# Patient Record
Sex: Male | Born: 1955 | Race: White | Hispanic: No | Marital: Married | Smoking: Never smoker
Health system: Southern US, Community
[De-identification: ages and names within clinical notes are randomized; demographics above are authoritative.]

## PROBLEM LIST (undated history)

## (undated) DIAGNOSIS — N529 Male erectile dysfunction, unspecified: Secondary | ICD-10-CM

## (undated) DIAGNOSIS — I480 Paroxysmal atrial fibrillation: Secondary | ICD-10-CM

## (undated) DIAGNOSIS — Z860100 Personal history of colon polyps, unspecified: Secondary | ICD-10-CM

## (undated) DIAGNOSIS — E669 Obesity, unspecified: Secondary | ICD-10-CM

## (undated) DIAGNOSIS — K5730 Diverticulosis of large intestine without perforation or abscess without bleeding: Secondary | ICD-10-CM

## (undated) DIAGNOSIS — J342 Deviated nasal septum: Secondary | ICD-10-CM

## (undated) DIAGNOSIS — E785 Hyperlipidemia, unspecified: Secondary | ICD-10-CM

## (undated) DIAGNOSIS — I428 Other cardiomyopathies: Secondary | ICD-10-CM

## (undated) DIAGNOSIS — E7800 Pure hypercholesterolemia, unspecified: Secondary | ICD-10-CM

## (undated) DIAGNOSIS — D860 Sarcoidosis of lung: Secondary | ICD-10-CM

## (undated) DIAGNOSIS — G4733 Obstructive sleep apnea (adult) (pediatric): Secondary | ICD-10-CM

## (undated) HISTORY — DX: Pure hypercholesterolemia, unspecified: E78.00

## (undated) HISTORY — DX: Diverticulosis of large intestine without perforation or abscess without bleeding: K57.30

## (undated) HISTORY — DX: Other cardiomyopathies: I42.8

## (undated) HISTORY — DX: Obesity, unspecified: E66.9

## (undated) HISTORY — PX: CATARACT EXTRACTION: SUR2

## (undated) HISTORY — DX: Personal history of colonic polyps: Z86.010

## (undated) HISTORY — DX: Deviated nasal septum: J34.2

## (undated) HISTORY — DX: Male erectile dysfunction, unspecified: N52.9

## (undated) HISTORY — DX: Hyperlipidemia, unspecified: E78.5

## (undated) HISTORY — DX: Personal history of colon polyps, unspecified: Z86.0100

## (undated) HISTORY — DX: Paroxysmal atrial fibrillation: I48.0

## (undated) HISTORY — DX: Obstructive sleep apnea (adult) (pediatric): G47.33

## (undated) HISTORY — DX: Sarcoidosis of lung: D86.0

---

## 1977-10-22 HISTORY — PX: TONSILLECTOMY: SUR1361

## 1995-10-23 DIAGNOSIS — D860 Sarcoidosis of lung: Secondary | ICD-10-CM

## 1995-10-23 HISTORY — PX: LUNG BIOPSY: SHX232

## 1995-10-23 HISTORY — DX: Sarcoidosis of lung: D86.0

## 1999-04-11 ENCOUNTER — Ambulatory Visit (HOSPITAL_COMMUNITY): Admission: RE | Admit: 1999-04-11 | Discharge: 1999-04-11 | Payer: Self-pay | Admitting: Family Medicine

## 1999-04-11 ENCOUNTER — Encounter: Payer: Self-pay | Admitting: Family Medicine

## 1999-05-05 ENCOUNTER — Ambulatory Visit (HOSPITAL_COMMUNITY): Admission: RE | Admit: 1999-05-05 | Discharge: 1999-05-05 | Payer: Self-pay | Admitting: Orthopedic Surgery

## 2001-06-13 ENCOUNTER — Encounter: Admission: RE | Admit: 2001-06-13 | Discharge: 2001-06-13 | Payer: Self-pay | Admitting: Family Medicine

## 2001-06-13 ENCOUNTER — Encounter: Payer: Self-pay | Admitting: Family Medicine

## 2002-04-22 ENCOUNTER — Encounter: Admission: RE | Admit: 2002-04-22 | Discharge: 2002-04-22 | Payer: Self-pay | Admitting: Family Medicine

## 2002-04-22 ENCOUNTER — Encounter: Payer: Self-pay | Admitting: Family Medicine

## 2002-07-16 ENCOUNTER — Ambulatory Visit (HOSPITAL_COMMUNITY): Admission: RE | Admit: 2002-07-16 | Discharge: 2002-07-16 | Payer: Self-pay | Admitting: *Deleted

## 2002-07-16 ENCOUNTER — Encounter: Payer: Self-pay | Admitting: *Deleted

## 2003-08-23 ENCOUNTER — Encounter: Payer: Self-pay | Admitting: Internal Medicine

## 2005-01-22 ENCOUNTER — Encounter: Admission: RE | Admit: 2005-01-22 | Discharge: 2005-01-22 | Payer: Self-pay | Admitting: Family Medicine

## 2006-01-22 ENCOUNTER — Encounter: Admission: RE | Admit: 2006-01-22 | Discharge: 2006-01-22 | Payer: Self-pay | Admitting: Family Medicine

## 2006-10-16 ENCOUNTER — Encounter: Admission: RE | Admit: 2006-10-16 | Discharge: 2006-10-16 | Payer: Self-pay | Admitting: Family Medicine

## 2007-10-23 HISTORY — PX: HERNIA REPAIR: SHX51

## 2008-02-24 ENCOUNTER — Encounter: Admission: RE | Admit: 2008-02-24 | Discharge: 2008-02-24 | Payer: Self-pay | Admitting: Family Medicine

## 2008-02-25 ENCOUNTER — Encounter: Admission: RE | Admit: 2008-02-25 | Discharge: 2008-02-25 | Payer: Self-pay | Admitting: Family Medicine

## 2009-06-28 ENCOUNTER — Encounter: Payer: Self-pay | Admitting: Internal Medicine

## 2009-07-25 ENCOUNTER — Encounter: Payer: Self-pay | Admitting: Internal Medicine

## 2009-08-23 ENCOUNTER — Ambulatory Visit: Payer: Self-pay | Admitting: Internal Medicine

## 2009-08-23 DIAGNOSIS — H9192 Unspecified hearing loss, left ear: Secondary | ICD-10-CM | POA: Insufficient documentation

## 2009-08-25 ENCOUNTER — Telehealth: Payer: Self-pay | Admitting: Internal Medicine

## 2009-08-26 ENCOUNTER — Encounter: Payer: Self-pay | Admitting: Internal Medicine

## 2009-08-31 ENCOUNTER — Encounter (INDEPENDENT_AMBULATORY_CARE_PROVIDER_SITE_OTHER): Payer: Self-pay | Admitting: *Deleted

## 2009-09-14 ENCOUNTER — Telehealth: Payer: Self-pay | Admitting: Internal Medicine

## 2009-10-24 ENCOUNTER — Ambulatory Visit: Payer: Self-pay | Admitting: Internal Medicine

## 2009-10-24 DIAGNOSIS — E785 Hyperlipidemia, unspecified: Secondary | ICD-10-CM | POA: Insufficient documentation

## 2009-10-24 DIAGNOSIS — Z9189 Other specified personal risk factors, not elsewhere classified: Secondary | ICD-10-CM | POA: Insufficient documentation

## 2009-11-11 DIAGNOSIS — M109 Gout, unspecified: Secondary | ICD-10-CM | POA: Insufficient documentation

## 2009-11-11 DIAGNOSIS — K5730 Diverticulosis of large intestine without perforation or abscess without bleeding: Secondary | ICD-10-CM | POA: Insufficient documentation

## 2009-11-11 DIAGNOSIS — Z86010 Personal history of colonic polyps: Secondary | ICD-10-CM | POA: Insufficient documentation

## 2009-11-28 ENCOUNTER — Ambulatory Visit: Payer: Self-pay | Admitting: Internal Medicine

## 2009-11-28 LAB — CONVERTED CEMR LAB: Direct LDL: 185.9 mg/dL

## 2009-12-01 ENCOUNTER — Telehealth: Payer: Self-pay | Admitting: Internal Medicine

## 2009-12-01 ENCOUNTER — Encounter: Admission: RE | Admit: 2009-12-01 | Discharge: 2010-01-12 | Payer: Self-pay | Admitting: Neurology

## 2009-12-01 ENCOUNTER — Ambulatory Visit: Payer: Self-pay | Admitting: Internal Medicine

## 2009-12-01 DIAGNOSIS — T7840XA Allergy, unspecified, initial encounter: Secondary | ICD-10-CM | POA: Insufficient documentation

## 2009-12-23 ENCOUNTER — Telehealth: Payer: Self-pay | Admitting: Internal Medicine

## 2009-12-23 DIAGNOSIS — R42 Dizziness and giddiness: Secondary | ICD-10-CM | POA: Insufficient documentation

## 2010-01-04 ENCOUNTER — Encounter: Payer: Self-pay | Admitting: Internal Medicine

## 2010-01-17 ENCOUNTER — Encounter: Payer: Self-pay | Admitting: Internal Medicine

## 2010-03-13 ENCOUNTER — Encounter: Payer: Self-pay | Admitting: Internal Medicine

## 2010-04-25 ENCOUNTER — Ambulatory Visit: Payer: Self-pay | Admitting: Family Medicine

## 2010-05-10 ENCOUNTER — Encounter: Payer: Self-pay | Admitting: Internal Medicine

## 2010-05-26 ENCOUNTER — Telehealth: Payer: Self-pay | Admitting: Internal Medicine

## 2010-10-30 ENCOUNTER — Ambulatory Visit
Admission: RE | Admit: 2010-10-30 | Discharge: 2010-10-30 | Payer: Self-pay | Source: Home / Self Care | Attending: Internal Medicine | Admitting: Internal Medicine

## 2010-11-21 NOTE — Letter (Signed)
Summary: [facility]  [facility]   Imported By: Maryln Gottron 05/16/2010 11:09:52  _____________________________________________________________________  External Attachment:    Type:   Image     Comment:   External Document

## 2010-11-21 NOTE — Letter (Signed)
Summary: [facility]  [facility]   Imported By: Maryln Gottron 05/16/2010 11:11:19  _____________________________________________________________________  External Attachment:    Type:   Image     Comment:   External Document

## 2010-11-21 NOTE — Assessment & Plan Note (Signed)
Summary: 1 MONTH ROA//LH   Vital Signs:  Patient profile:   55 year old male Weight:      244 pounds Temp:     98.0 degrees F oral BP sitting:   130 / 80  (right arm) Cuff size:   regular  Vitals Entered By: Raechel Ache, RN (November 28, 2009 11:47 AM) CC: rov for labs, pt. is fasting   CC:  rov for labs and pt. is fasting.  History of Present Illness: 55 year old patient who is seen today for reassessment of his blood pressure and follow-up of his dyslipidemia.  He is scheduled for neurology follow-up tomorrow and apparently is scheduled for some vestibular rehab.  He continues to have left-sided deafness and is quite bothered by both tinnitus  and some positional vertigo. He states while snow skiing in [place], he did  quite well as far as the vertigo.  Allergies: No Known Drug Allergies  Past History:  Past Medical History: Reviewed history from 11/11/2009 and no changes required. 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  120/78overweight-appearing.   Ears:  External ear exam shows no significant lesions or deformities.  Otoscopic examination reveals clear canals, tympanic membranes are intact bilaterally without bulging, retraction, inflammation or discharge. Hearing is grossly normal bilaterally. Neurologic:  mild physiologic  nystagmus; normal finger-nose testing   Impression & Recommendations:  Problem # 1:  HYPERLIPIDEMIA (ICD-272.4)  Orders: Venipuncture (16109) TLB-Lipid Panel (80061-LIPID)  Problem # 2:  ASTHMA (ICD-493.90)  His updated medication list for this problem includes:    Proair Hfa 108 (90 Base) Mcg/act Aers (Albuterol sulfate) .Marland Kitchen... 2 puffs three times a day as  needed sob  His updated medication list for this problem includes:    Proair Hfa 108 (90 Base) Mcg/act Aers (Albuterol sulfate) .Marland Kitchen... 2 puffs three times a day as needed sob  Complete Medication List: 1)  Proair Hfa 108 (90 Base) Mcg/act Aers (Albuterol sulfate) .... 2 puffs three times a day as needed sob 2)  Hydrocodone-homatropine 5-1.5 Mg/59ml Syrp (Hydrocodone-homatropine) .Marland Kitchen.. 1 teaspoon every 6 hours as needed for cough  Patient Instructions: 1)  Please schedule a follow-up appointment in 6 months. 2)  Limit your Sodium (Salt). 3)  It is important that you exercise regularly at least 20 minutes 5 times a week. If you develop chest pain, have severe difficulty breathing, or feel very tired , stop exercising immediately and seek medical attention.

## 2010-11-21 NOTE — Progress Notes (Signed)
Summary: allergic reaction  Phone Note Call from Patient   Caller: Patient Call For: Gordy Savers  MD Summary of Call: having some sort of allergic reaction since monday- mouth and lips swollen, dry throat- taking benedryl and not helping. No trouble breathing. Initial call taken by: Raechel Ache, RN,  December 01, 2009 11:41 AM  Follow-up for Phone Call        appt today 1pm. Follow-up by: Raechel Ache, RN,  December 01, 2009 11:41 AM  Additional Follow-up for Phone Call Additional follow up Details #1::        noted Additional Follow-up by: Gordy Savers  MD,  December 01, 2009 12:43 PM

## 2010-11-21 NOTE — Letter (Signed)
Summary: [facility]  [facility]   Imported By: Maryln Gottron 02/15/2010 14:50:19  _____________________________________________________________________  External Attachment:    Type:   Image     Comment:   External Document

## 2010-11-21 NOTE — Letter (Signed)
Summary: [facility]  [facility]   Imported By: Maryln Gottron 01/06/2010 13:53:22  _____________________________________________________________________  External Attachment:    Type:   Image     Comment:   External Document

## 2010-11-21 NOTE — Assessment & Plan Note (Signed)
Summary: allergic reaction   Vital Signs:  Patient profile:   55 year old male Weight:      243 pounds O2 Sat:      97 % on Room air Temp:     98.4 degrees F oral BP sitting:   130 / 98  (left arm) Cuff size:   regular  Vitals Entered By: Duard Brady LPN (December 01, 2009 1:00 PM)  O2 Flow:  Room air CC: allergic reation possibly to food - swollen throat , no SOB , otc benadryl not helping   CC:  allergic reation possibly to food - swollen throat , no SOB , and otc benadryl not helping.  History of Present Illness: 55 year old patient who is seen today for follow-up.  He was seen here 4 days ago and seen by neurology 3 days ago.  For the past few days, he has had a sense of swelling, numbness and tingling involving primarily his lower lip, tongue, and hard palate. He  also noted a small fever blister involving his left upper lip.  initially attributed these symptoms to a food allergy since the evening before he prepared  a  hot and spicy chili dinner.  He has had some mild food allergy symptoms in the past, but he states these  symptoms always resolve within two days.  He was also quite concerned about his hearing since he recalled that in October when he had the onset of left-sided hearing loss, he presented with similar symptoms.  His symptoms at that time were attributed to Crestor, which was recently added due to dyslipidemia.  He has been using Benadryl this week without benefit.  He also describes a vague altered hearing sensation on the right and was understandably concerned about possible impending hearing loss on the right.  Allergies: No Known Drug Allergies  Past History:  Past Medical History: Reviewed history from 11/11/2009 and no changes required. pulmonary  sarcoid-lung biopsy in 1997 hearing loss left ear Hyperlipidemia history of statin myopathy hypertensive suspect modest exogenous obesity septal deviation/ history of loud snoring/no daytime sleepiness  Pollyann Kennedy) Colonic polyps, hx of Diverticulosis, colon Gout ED  Review of Systems       The patient complains of decreased hearing.  The patient denies anorexia, fever, weight loss, weight gain, vision loss, hoarseness, chest pain, syncope, dyspnea on exertion, peripheral edema, prolonged cough, headaches, hemoptysis, abdominal pain, melena, hematochezia, severe indigestion/heartburn, hematuria, incontinence, genital sores, muscle weakness, suspicious skin lesions, transient blindness, difficulty walking, depression, unusual weight change, abnormal bleeding, enlarged lymph nodes, angioedema, breast masses, and testicular masses.    Physical Exam  General:  overweight-appearing.  140/90 Head:  Normocephalic and atraumatic without obvious abnormalities. No apparent alopecia or balding. Eyes:  No corneal or conjunctival inflammation noted. EOMI. Perrla. Funduscopic exam benign, without hemorrhages, exudates or papilledema. Vision grossly normal. Ears:  External ear exam shows no significant lesions or deformities.  Otoscopic examination reveals clear canals, tympanic membranes are intact bilaterally without bulging, retraction, inflammation or discharge. Hearing is severely impaired on the left Mouth:  Oral mucosa and oropharynx without lesions or exudates.  Teeth in good repair. no erythema, or edema; a small, dry, crusted lesion involving the left upper lip noted; sensation was intact involving  the lips, tongue, palate, and buccal mucosa Neck:  No deformities, masses, or tenderness noted. Lungs:  Normal respiratory effort, chest expands symmetrically. Lungs are clear to auscultation, no crackles or wheezes. Heart:  Normal rate and regular rhythm. S1 and S2 normal  without gallop, murmur, click, rub or other extra sounds.   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 2 puffs three times a day as needed sob 2)  Hydrocodone-homatropine 5-1.5 Mg/36ml Syrp (Hydrocodone-homatropine) .Marland Kitchen.. 1 teaspoon every 6 hours as needed for cough  Other Orders: Depo- Medrol 80mg  (J1040) Admin of Therapeutic Inj  intramuscular or subcutaneous (40981)  Patient Instructions: 1)  Limit your Sodium (Salt). 2)  It is important that you exercise regularly at least 20 minutes 5 times a week. If you develop chest pain, have severe difficulty breathing, or feel very tired , stop exercising immediately and seek medical attention. 3)  You need to lose weight. Consider a lower calorie diet and regular exercise.  4)  Check your Blood Pressure regularly. If it is above: 140/90  you should make an appointment. 5)  Neurology follow up 6)  Zyrtec one daily    Medication Administration  Injection # 1:    Medication: Depo- Medrol 80mg     Diagnosis: ALLERGIC REACTION (ICD-995.3)    Route: IM    Site: L deltoid    Exp Date: 07/22/2010    Lot #: [id number]    Mfr: Teva    Patient tolerated injection without complications    Given by: Duard Brady LPN (December 01, 2009 2:34 PM)  Orders Added: 1)  Est. Patient Level III [56213] 2)  Depo- Medrol 80mg  [J1040] 3)  Admin of Therapeutic Inj  intramuscular or subcutaneous [08657]

## 2010-11-21 NOTE — Therapy (Signed)
Summary: Audiological Evaluation/[facility]  Audiological Evaluation/[facility]   Imported By: Maryln Gottron 10/27/2009 13:32:55  _____________________________________________________________________  External Attachment:    Type:   Image     Comment:   External Document

## 2010-11-21 NOTE — Assessment & Plan Note (Signed)
Summary: F/U ON HEARING LOSS ISSUES // RS   Vital Signs:  Patient profile:   55 year old male Weight:      242 pounds BMI:     32.94 BP sitting:   150 / 104  (left arm) Cuff size:   regular  Vitals Entered By: Raechel Ache, RN (October 24, 2009 11:31 AM) CC: F/u hearing loss. Recent dizziness Is Patient Diabetic? No   CC:  F/u hearing loss. Recent dizziness.  History of Present Illness: 55 year old patien who is seen today for follow-up.  The patient has a history of acute left sensorineural hearing loss and has had a neurology appointment since his last visit here.  He was told that he had possibly a small ischemic  stroke or deafness related to a viral illness.  A brain MRI with and without contrast was obtained.  This revealed a negative IAC.  Examination and no findings to suggest an acoustic schwannoma.  There is mild nonspecific bihemispheric white matter changes as well  as a left   Nasal septal deviation. He has a history of dyslipidemia, and hearing  loss occurred shortly after initiating Crestor therapy.  He states that he has been on a number of other statins that have caused either myopathy or have been ineffective. He is now off prednisone he states that he has an occasional mild vertigo over the past few days with the onset of URI symptoms.  He seems to be improving.  He did play golf two days ago  Allergies: No Known Drug Allergies  Past History:  Past Medical History: pulmonary  sarcoid-lung biopsy in 1997 hearing loss left ear Hyperlipidemia history of statin myopathy hypertensive suspect modest exogenous obesity septal deviation/ history of loud snoring/no daytime sleepiness  Past Surgical History: Reviewed history from 08/23/2009 and no changes required. tonsillectomy 1979 umbilical hernia repair 2009 lung biopsy 1997  colonoscopy 2010 And lines are in and a left upper was or 5 mg 6 times a week and 2.5 by a were taken pathology in the Review of  Systems       The patient complains of decreased hearing.  The patient denies anorexia, fever, weight loss, weight gain, vision loss, hoarseness, chest pain, syncope, dyspnea on exertion, peripheral edema, prolonged cough, headaches, hemoptysis, abdominal pain, melena, hematochezia, severe indigestion/heartburn, hematuria, incontinence, genital sores, muscle weakness, suspicious skin lesions, transient blindness, difficulty walking, depression, unusual weight change, abnormal bleeding, enlarged lymph nodes, angioedema, breast masses, and testicular masses.    Physical Exam  General:  overweight-appearing.  150/90 Head:  Normocephalic and atraumatic without obvious abnormalities. No apparent alopecia or balding. Eyes:  No corneal or conjunctival inflammation noted. EOMI. Perrla. Funduscopic exam benign, without hemorrhages, exudates or papilledema. Vision grossly normal. Ears:  External ear exam shows no significant lesions or deformities.  Otoscopic examination reveals clear canals, tympanic membranes are intact bilaterally without bulging, retraction, inflammation or discharge. Hearing is grossly normal bilaterally. Mouth:  Oral mucosa and oropharynx without lesions or exudates.  Teeth in good repair. Neck:  No deformities, masses, or tenderness noted. Lungs:  Normal respiratory effort, chest expands symmetrically. Lungs are clear to auscultation, no crackles or wheezes. Heart:  Normal rate and regular rhythm. S1 and S2 normal without gallop, murmur, click, rub or other extra sounds.   Impression & Recommendations:  Problem # 1:  HYPERLIPIDEMIA (ICD-272.4) will reassess in one month to reevaluate his blood pressure  Problem # 2:  ASTHMA (ICD-493.90)  The following medications were removed  from the medication list:    Prednisone 20 Mg Tabs (Prednisone) .Marland Kitchen... 1 three times a day His updated medication list for this problem includes:    Proair Hfa 108 (90 Base) Mcg/act Aers (Albuterol  sulfate) .Marland Kitchen... 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 2 puffs three times a day as needed sob 2)  Hydrocodone-homatropine 5-1.5 Mg/51ml Syrp (Hydrocodone-homatropine) .Marland Kitchen.. 1 teaspoon every 6 hours as needed for cough  Patient Instructions: 1)  Please schedule a follow-up appointment in 1 month. 2)  Advised not to eat any food or drink any liquids after 10 PM the night before your procedure. 3)  Limit your Sodium (Salt). 4)  It is important that you exercise regularly at least 20 minutes 5 times a week. If you develop chest pain, have severe difficulty breathing, or feel very tired , stop exercising immediately and seek medical attention. 5)  You need to lose weight. Consider a lower calorie diet and regular exercise.  Prescriptions: HYDROCODONE-HOMATROPINE 5-1.5 MG/5ML SYRP (HYDROCODONE-HOMATROPINE) 1 teaspoon every 6 hours as needed for cough  #6 oz x 0   Entered and Authorized by:   Gordy Savers  MD   Signed by:   Gordy Savers  MD on 10/24/2009   Method used:   Print then Give to Patient   RxID:   [id number] PROAIR HFA 108 (90 BASE) MCG/ACT AERS (ALBUTEROL SULFATE) 2 puffs three times a day as needed SOB  #1 x 6   Entered and Authorized by:   Gordy Savers  MD   Signed by:   Gordy Savers  MD on 10/24/2009   Method used:   Print then Give to Patient   RxID:   [id number]

## 2010-11-21 NOTE — Op Note (Signed)
Summary: Bilateral Cataract Removal/Shreyansh L. Groat, MD  Bilateral Cataract Removal/Morty L. Dione Booze, MD   Imported By: Maryln Gottron 04/06/2010 10:08:42  _____________________________________________________________________  External Attachment:    Type:   Image     Comment:   External Document

## 2010-11-21 NOTE — Letter (Signed)
Summary: Date Range:05-10-2005 to 06-28-09/[facility]  Date Range:05-10-2005 to 06-28-09/[facility]   Imported By: Sherian Rein 11/14/2009 12:08:43  _____________________________________________________________________  External Attachment:    Type:   Image     Comment:   External Document

## 2010-11-21 NOTE — Letter (Signed)
Summary: Date Range:08-21-04 to 07-25-09/[facility]  Date Range:08-21-04 to 07-25-09/[facility]   Imported By: Sherian Rein 11/14/2009 12:29:27  _____________________________________________________________________  External Attachment:    Type:   Image     Comment:   External Document

## 2010-11-21 NOTE — Assessment & Plan Note (Signed)
Summary: JOINT PAIN / TICK BITE /CONCERED ABOUT LD // RS   Vital Signs:  Patient profile:   55 year old male Weight:      246 pounds Temp:     98.1 degrees F oral BP sitting:   132 / 94  (left arm) Cuff size:   regular  Vitals Entered By: Kern Reap CMA Duncan Dull) (April 25, 2010 1:49 PM) CC: right ankle pain, tick bite   CC:  right ankle pain and tick bite.  History of Present Illness: Jason Weaver is a 55 year old male, nonsmoker, who comes in today for evaluation of two problems.  He had a history of elevated uric acid and been diagnosed with gout.  He said repeat episodes, however, he's not taken any allopurinol.  Now his right ankle is swollen and painful and is miserable.  This past spring.  He had to have a right knee effusion drained by ortho.   Six weeks ago.  He found by to ticks on him when it was embedded in his buttocks.  Two days ago he began having some fever, chills, and aching all over and is concerned he may have tick fever  Allergies (verified): No Known Drug Allergies  Past History:  Past medical, surgical, family and social histories (including risk factors) reviewed for relevance to current acute and chronic problems.  Past Medical History: Reviewed history from 11/11/2009 and no changes required. pulmonary  sarcoid-lung biopsy in 1997 hearing loss left ear Hyperlipidemia history of statin myopathy hypertensive suspect modest exogenous obesity septal deviation/ history of loud snoring/no daytime sleepiness Pollyann Kennedy) Colonic polyps, hx of Diverticulosis, colon Gout ED  Past Surgical History: Reviewed history from 11/11/2009 and no changes required. 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 father age 53, history of tobacco use, and COPD mother, age 62, history of RA maternal grandmother history of  RA one brother history of EtOH one sister with history of asthma and remote breast cancer  Social History: Reviewed history from 08/23/2009 and no changes required. Divorced Never Smoked no children [employer]  Review of Systems      See HPI  Physical Exam  General:  Well-developed,well-nourished,in no acute distress; alert,appropriate and cooperative throughout examination Msk:  the right ankle is enlarged and x 2.  Its tender, red and swollen Pulses:  R and L carotid,radial,femoral,dorsalis pedis and posterior tibial pulses are full and equal bilaterally   Problems:  Medical Problems Added: 1)  Dx of Fever  (ICD-780.60)  Impression & Recommendations:  Problem # 1:  GOUT (ICD-274.9) Assessment Deteriorated  His updated medication list for this problem includes:    Allopurinol 300 Mg Tabs (Allopurinol) .Marland Kitchen... Take 1 tablet by mouth every morning  Orders: Prescription Created Electronically [phone])  Problem # 2:  FEVER (ICD-780.60) Assessment: New  Orders: Prescription Created Electronically [id number])  Complete Medication List: 1)  Proair Hfa 108 (90 Base) Mcg/act Aers (Albuterol sulfate) .... 2 puffs three times a day as needed sob 2)  Nabumetone 500 Mg Tabs (Nabumetone) .... Take two tabs in the morning and two tabs in the evening 3)  Allopurinol 300 Mg Tabs (Allopurinol) .... Take 1 tablet by mouth every morning 4)  Prednisone 20 Mg Tabs (Prednisone) .... Uad 5)  Vicodin Es 7.5-750 Mg Tabs (Hydrocodone-acetaminophen) .Marland Kitchen.. 1 tab @ bedtime as needed pain  Patient Instructions: 1)  doxycycline  100 mg b.i.d. x 10 days. 2)  Prednisone two tabs x 3 days, one x 3 days, a half x 3 days, then half a tablet Monday, Wednesday, Friday, for a two week taper. 3)  Elevation and ice. 4)  Begin allopurinol 300 mg q.a.m............. daily............ forever!!!!!!!!!!! 5)  Please schedule a follow-up appointment as needed. Prescriptions: VICODIN ES 7.5-750 MG TABS  (HYDROCODONE-ACETAMINOPHEN) 1 tab @ bedtime as needed pain  #20 x 1   Entered and Authorized by:   Roderick Pee MD   Signed by:   Roderick Pee MD on 04/25/2010   Method used:   Print then Give to Patient   RxID:   [id number] PREDNISONE 20 MG TABS (PREDNISONE) UAD  #30 x 1   Entered and Authorized by:   Roderick Pee MD   Signed by:   Roderick Pee MD on 04/25/2010   Method used:   Electronically to        [facility]. [phone]* (retail)       [address]       [place], [place]  [address]       Ph: [phone]       Fax: [phone]   RxID:   [id number] ALLOPURINOL 300 MG TABS (ALLOPURINOL) Take 1 tablet by mouth every morning  #100 x 3   Entered and Authorized by:   Roderick Pee MD   Signed by:   Roderick Pee MD on 04/25/2010   Method used:   Electronically to        [facility]. [phone]* (retail)       [address]       [place], [place]  [address]       Ph: [phone]       Fax: [phone]   RxID:   [id number]

## 2010-11-21 NOTE — Miscellaneous (Signed)
Summary: Hep A & TD  Hep A & TD   Imported By: Sherian Rein 11/14/2009 12:12:13  _____________________________________________________________________  External Attachment:    Type:   Image     Comment:   External Document

## 2010-11-21 NOTE — Progress Notes (Signed)
Summary: [facility] allopurinol  Phone Note Refill Request Message from:  Fax from Pharmacy on May 26, 2010 11:52 AM  Refills Requested: Medication #1:  ALLOPURINOL 300 MG TABS Take 1 tablet by mouth every morning [facility] mail order   Method Requested: Fax to Local Pharmacy Initial call taken by: Duard Brady LPN,  May 26, 2010 11:53 AM    Prescriptions: ALLOPURINOL 300 MG TABS (ALLOPURINOL) Take 1 tablet by mouth every morning  #100 x 2   Entered by:   Duard Brady LPN   Authorized by:   Gordy Savers  MD   Signed by:   Duard Brady LPN on 16/07/9603   Method used:   Faxed to ...       [facility] (mail-order)             , [place]         Ph: [phone]       Fax: [phone]   RxID:   [id number]

## 2010-11-21 NOTE — Progress Notes (Signed)
Summary: MRI  Phone Note Call from Patient   Caller: Patient Call For: Gordy Savers  MD Summary of Call: Pt is asking if Dr. Kirtland Bouchard will schedule a MRI for pt's complaint of dizziness?  [phone] Initial call taken by: Lynann Beaver CMA,  December 23, 2009 11:13 AM  Follow-up for Phone Call        please schedule neuro appt for next week; let me know if unable to schedule for next week Follow-up by: Gordy Savers  MD,  December 23, 2009 12:55 PM  Additional Follow-up for Phone Call Additional follow up Details #1::        Appt Scheduled.  LMOM for Pt. Additional Follow-up by: Corky Mull,  December 28, 2009 8:49 AM  New Problems: DIZZINESS (ICD-780.4)   New Problems: DIZZINESS (ICD-780.4)

## 2010-11-23 NOTE — Assessment & Plan Note (Signed)
Summary: fu on meds/njr   Vital Signs:  Patient profile:   55 year old male Weight:      249 pounds Temp:     98.1 degrees F oral BP sitting:   124 / 80  (left arm) Cuff size:   regular  Vitals Entered By: Duard Brady LPN (October 30, 2010 8:34 AM) CC: f/u on meds and has other issues to discuss Is Patient Diabetic? No   CC:  f/u on meds and has other issues to discuss.  History of Present Illness: 15 -year-old patient who is seen today for follow-up.  He has a history of mild asthma and gout.  Approximately 18 months ago was evaluated by ENT for hearing loss on the left.  This is unchanged, but his gait instability, double vision, have all improved.  His asthma has been stable.  He does have a mild URI today  Allergies (verified): No Known Drug Allergies  Past History:  Past Medical History: Reviewed history from 11/11/2009 and no changes required. 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  The patient denies anorexia, fever, weight loss, weight gain, vision loss, decreased hearing, hoarseness, chest pain, syncope, dyspnea on exertion, peripheral edema, headaches, hemoptysis, abdominal pain, melena, hematochezia, severe indigestion/heartburn, hematuria, incontinence, genital sores, muscle weakness, suspicious skin lesions, transient blindness, difficulty walking, depression, unusual weight change, abnormal bleeding, enlarged lymph nodes, angioedema, breast masses, and testicular masses.    Physical  Exam  General:  overweight-appearing.  130/82overweight-appearing.   Head:  Normocephalic and atraumatic without obvious abnormalities. No apparent alopecia or balding. Eyes:  No corneal or conjunctival inflammation noted. EOMI. Perrla. Funduscopic exam benign, without hemorrhages, exudates or papilledema. Vision grossly normal. Ears:  External ear exam shows no significant lesions or deformities.  Otoscopic examination reveals clear canals, tympanic membranes are intact bilaterally without bulging, retraction, inflammation or discharge. Hearing is grossly normal bilaterally. Mouth:  Oral mucosa and oropharynx without lesions or exudates.  Teeth in good repair. Neck:  No deformities, masses, or tenderness noted. Lungs:  Normal respiratory effort, chest expands symmetrically. Lungs are clear to auscultation, no crackles or wheezes. Heart:  Normal rate and regular rhythm. S1 and S2 normal without gallop, murmur, click, rub or other extra sounds. Abdomen:  Bowel sounds positive,abdomen soft and non-tender without masses, organomegaly or hernias noted. Rectal:  No external abnormalities noted. Normal sphincter tone. No rectal masses or tenderness. Prostate:  Prostate gland firm and smooth, no enlargement, nodularity, tenderness, mass, asymmetry or induration. Msk:  No deformity or scoliosis noted of thoracic or lumbar spine.   Pulses:  R and L carotid,radial,femoral,dorsalis pedis and posterior tibial pulses are full and equal bilaterally Extremities:  No clubbing, cyanosis, edema, or deformity noted with normal full range of motion of all joints.   Skin:  Intact without suspicious lesions or rashes   Impression & Recommendations:  Problem # 1:  GOUT (ICD-274.9)  His updated medication list for this problem includes:    Allopurinol 300 Mg Tabs (Allopurinol) .Marland Kitchen... Take 1 tablet by mouth every morning  His updated medication list for this  problem includes:    Allopurinol 300 Mg Tabs  (Allopurinol) .Marland Kitchen... Take 1 tablet by mouth every morning  Problem # 2:  ASTHMA (ICD-493.90)  The following medications were removed from the medication list:    Prednisone 20 Mg Tabs (Prednisone) ..... Uad His updated medication list for this problem includes:    Proair Hfa 108 (90 Base) Mcg/act Aers (Albuterol sulfate) .Marland Kitchen... 2 puffs three times a day as needed sob  The following medications were removed from the medication list:    Prednisone 20 Mg Tabs (Prednisone) ..... Uad His updated medication list for this problem includes:    Proair Hfa 108 (90 Base) Mcg/act Aers (Albuterol sulfate) .Marland Kitchen... 2 puffs three times a day as needed sob  Complete Medication List: 1)  Proair Hfa 108 (90 Base) Mcg/act Aers (Albuterol sulfate) .... 2 puffs three times a day as needed sob 2)  Nabumetone 500 Mg Tabs (Nabumetone) .... Take two tabs in the morning and two tabs in the evening 3)  Allopurinol 300 Mg Tabs (Allopurinol) .... Take 1 tablet by mouth every morning  Patient Instructions: 1)  Please schedule a follow-up appointment in 1 year. 2)  Limit your Sodium (Salt). 3)  It is important that you exercise regularly at least 20 minutes 5 times a week. If you develop chest pain, have severe difficulty breathing, or feel very tired , stop exercising immediately and seek medical attention. 4)  You need to lose weight. Consider a lower calorie diet and regular exercise.  Prescriptions: PROAIR HFA 108 (90 BASE) MCG/ACT AERS (ALBUTEROL SULFATE) 2 puffs three times a day as needed SOB  #1 x 6   Entered and Authorized by:   Gordy Savers  MD   Signed by:   Gordy Savers  MD on 10/30/2010   Method used:   Electronically to        [facility]. [phone]* (retail)       [address]       [place], [place]  [address]       Ph: [phone]       Fax: [phone]   RxID:   [id number]    Orders Added: 1)  Est. Patient Level III [44034]

## 2011-01-10 ENCOUNTER — Telehealth: Payer: Self-pay | Admitting: *Deleted

## 2011-01-10 NOTE — Telephone Encounter (Signed)
Pt would like a referral for a Pulmonologist, please.

## 2011-01-11 ENCOUNTER — Ambulatory Visit (INDEPENDENT_AMBULATORY_CARE_PROVIDER_SITE_OTHER): Payer: 59 | Admitting: Internal Medicine

## 2011-01-11 ENCOUNTER — Encounter: Payer: Self-pay | Admitting: Internal Medicine

## 2011-01-11 DIAGNOSIS — D869 Sarcoidosis, unspecified: Secondary | ICD-10-CM

## 2011-01-11 DIAGNOSIS — J99 Respiratory disorders in diseases classified elsewhere: Secondary | ICD-10-CM

## 2011-01-11 DIAGNOSIS — J309 Allergic rhinitis, unspecified: Secondary | ICD-10-CM

## 2011-01-11 DIAGNOSIS — D860 Sarcoidosis of lung: Secondary | ICD-10-CM

## 2011-01-11 MED ORDER — HYDROCODONE-HOMATROPINE 5-1.5 MG/5ML PO SYRP: 5.0000 mL | ORAL_SOLUTION | Freq: Four times a day (QID) | ORAL | Status: DC | PRN

## 2011-01-11 MED ORDER — PREDNISONE 10 MG PO TABS: 10.0000 mg | ORAL_TABLET | Freq: Every day | ORAL | Status: AC

## 2011-01-11 MED ORDER — METHYLPREDNISOLONE ACETATE 80 MG/ML IJ SUSP
80.0000 mg | Freq: Once | INTRAMUSCULAR | Status: AC
  Administered 2011-01-11: 80 mg via INTRAMUSCULAR

## 2011-01-11 MED ORDER — MOMETASONE FURO-FORMOTEROL FUM 200-5 MCG/ACT IN AERO: INHALATION_SPRAY | RESPIRATORY_TRACT | Status: DC

## 2011-01-11 NOTE — Patient Instructions (Signed)
Pulmonary follow-up as scheduled  Call or return to clinic prn if these symptoms worsen or fail to improve as anticipated.  

## 2011-01-11 NOTE — Progress Notes (Signed)
  Subjective:    Patient ID: Jason Weaver, male    DOB: [birth year], 55 y.o.   MRN: [id number]  HPI   55 year old patient he has a history of biopsy-proven sarcoid lung disease since 1997. For the past several days he has had increasing chest tightness wheezing and largely nonproductive cough. He has a difficult time sleeping due to his coughing and wheezing. There's been no fever. He states this episode is very similar to his sarcoid flares in the past. There is no recent chest x-ray on file but a CT of the chest from 10 years ago was reviewed. He called yesterday for a pulmonary referral and this referral is in  Progress.  Review of Systems  Constitutional: Negative for fever, chills, appetite change and fatigue.  HENT: Negative for hearing loss, ear pain, congestion, sore throat, trouble swallowing, neck stiffness, dental problem, voice change and tinnitus.   Eyes: Negative for pain, discharge and visual disturbance.  Respiratory: Positive for cough, shortness of breath and wheezing. Negative for chest tightness and stridor.   Cardiovascular: Negative for chest pain, palpitations and leg swelling.  Gastrointestinal: Negative for nausea, vomiting, abdominal pain, diarrhea, constipation, blood in stool and abdominal distention.  Genitourinary: Negative for urgency, hematuria, flank pain, discharge, difficulty urinating and genital sores.  Musculoskeletal: Negative for myalgias, back pain, joint swelling, arthralgias and gait problem.  Skin: Negative for rash.  Neurological: Negative for dizziness, syncope, speech difficulty, weakness, numbness and headaches.  Hematological: Negative for adenopathy. Does not bruise/bleed easily.  Psychiatric/Behavioral: Negative for behavioral problems and dysphoric mood. The patient is not nervous/anxious.        Objective:   Physical Exam  Constitutional: He is oriented to person, place, and time. He appears well-developed and well-nourished. No distress.    HENT:  Head: Normocephalic.  Right Ear: External ear normal.  Left Ear: External ear normal.  Eyes: Conjunctivae and EOM are normal.  Neck: Normal range of motion.  Cardiovascular: Normal rate and normal heart sounds.   Pulmonary/Chest: Effort normal and breath sounds normal. No respiratory distress. He has no wheezes. He has no rales.  Abdominal: Bowel sounds are normal.  Musculoskeletal: Normal range of motion. He exhibits no edema and no tenderness.  Neurological: He is alert and oriented to person, place, and time.  Psychiatric: He has a normal mood and affect. His behavior is normal.          Assessment & Plan:   pulmonary sarcoidosis. No evidence of active wheezing at this time. We'll treat aggressively with parenteral steroids and placed on a modest dose of oral prednisone. Will see pulmonary in followup soon who will consider a higher prednisone dose depending on his clinical response

## 2011-01-11 NOTE — Telephone Encounter (Signed)
Spoke with pt - aware order put in , teri will call once set up. KIK

## 2011-01-11 NOTE — Telephone Encounter (Signed)
Addended by: Duard Brady on: 01/11/2011 12:42 PM   Modules accepted: Orders

## 2011-01-11 NOTE — Telephone Encounter (Signed)
ok 

## 2011-01-22 ENCOUNTER — Telehealth: Payer: Self-pay | Admitting: Critical Care Medicine

## 2011-01-22 ENCOUNTER — Encounter: Payer: Self-pay | Admitting: Pulmonary Disease

## 2011-01-22 NOTE — Telephone Encounter (Signed)
lmomtcb x 1  I looked in IDX, Centricity, and EPIC pt has not been seen by our providers.

## 2011-01-22 NOTE — Telephone Encounter (Signed)
Pt is scheduled to see PW 4/17. Pt changed to 01/23/11 @ 3:45pm in [facility] with RA. Pt is aware of [facility] appointment, given address and phone number.

## 2011-01-23 ENCOUNTER — Other Ambulatory Visit: Payer: Self-pay | Admitting: Pulmonary Disease

## 2011-01-23 ENCOUNTER — Ambulatory Visit (HOSPITAL_BASED_OUTPATIENT_CLINIC_OR_DEPARTMENT_OTHER)
Admission: RE | Admit: 2011-01-23 | Discharge: 2011-01-23 | Disposition: A | Discharge status: Home / Self Care | Payer: 59 | Source: Ambulatory Visit | Attending: Pulmonary Disease | Admitting: Pulmonary Disease

## 2011-01-23 ENCOUNTER — Ambulatory Visit (INDEPENDENT_AMBULATORY_CARE_PROVIDER_SITE_OTHER): Payer: 59 | Admitting: Pulmonary Disease

## 2011-01-23 ENCOUNTER — Encounter: Payer: Self-pay | Admitting: Pulmonary Disease

## 2011-01-23 VITALS — BP 120/80 | HR 89 | Temp 98.2°F | Ht 72.0 in | Wt 239.0 lb

## 2011-01-23 DIAGNOSIS — D869 Sarcoidosis, unspecified: Secondary | ICD-10-CM

## 2011-01-23 DIAGNOSIS — D860 Sarcoidosis of lung: Secondary | ICD-10-CM

## 2011-01-23 DIAGNOSIS — M47814 Spondylosis without myelopathy or radiculopathy, thoracic region: Secondary | ICD-10-CM | POA: Insufficient documentation

## 2011-01-23 DIAGNOSIS — R059 Cough, unspecified: Secondary | ICD-10-CM | POA: Insufficient documentation

## 2011-01-23 DIAGNOSIS — R05 Cough: Secondary | ICD-10-CM | POA: Insufficient documentation

## 2011-01-23 DIAGNOSIS — J99 Respiratory disorders in diseases classified elsewhere: Secondary | ICD-10-CM

## 2011-01-23 MED ORDER — PREDNISONE 10 MG PO TABS: ORAL_TABLET | ORAL | Status: AC

## 2011-01-23 MED ORDER — PROMETHAZINE-CODEINE 6.25-10 MG/5ML PO SYRP: 5.0000 mL | ORAL_SOLUTION | Freq: Three times a day (TID) | ORAL | Status: AC | PRN

## 2011-01-23 NOTE — Assessment & Plan Note (Signed)
Likely sarcoid flare ? Reason, pollen, doubt viral - no preceding symptoms Spirometry shows preserved lung function Will attempt longer course of prednisone, start at 40 mg  & taper to 20 mg in 1 week , then stay at this level until FU visit Chk CBC, CMET, CXR today Take zyrtec + nasonex for allergic rhinitis Codeine syrup for symptomatic relief prn

## 2011-01-23 NOTE — Progress Notes (Signed)
  Subjective:    Patient ID: Jason Weaver, male    DOB: [birth year], 55 y.o.   MRN: [id number]  HPI [name], never smoker with sarcoidosis for evlauation of cough x 2 weeks Cough - x 2 weeks, violent, gives him headaches Wheeze - tickling in throat Gurgle, phlegm - green a wk ago, now white- yellow Dulera, albuterol , MDI did not help, cough syrup makes him sleep -codeine, prednisone shot, then  20 mg x 10 ds - no relief No spiecific aggravating / relieving factors, no preceding URI Diagnosed with sarcoidosis in '97 at [facility], by exclusion,imaging & bronchoscopy - not TB, lymphoma 2011 - lost hearing on left -MRI neg for sarcoid of the brain,saw EN[facility] & neurologist [facility] >> given high dose steroids, Ct chest done , report not available but ENT note reports Bl scattered sub cm nodules, CT from 2002 notes mediastinal Lymphadenopathy He also reports skin lesions on chest , inconclusive biopsy , given cream for LA.    Review of Systems  Constitutional: Negative for fever, appetite change and unexpected weight change.  HENT: Negative for ear pain, congestion, sore throat, rhinorrhea, sneezing, trouble swallowing, dental problem and postnasal drip.   Eyes: Negative for redness.  Respiratory: Positive for cough. Negative for shortness of breath and wheezing.   Cardiovascular: Negative for chest pain, palpitations and leg swelling.  Gastrointestinal: Negative for nausea, vomiting, abdominal pain and diarrhea.  Genitourinary: Negative for dysuria and urgency.  Musculoskeletal: Negative for joint swelling.  Skin: Negative for rash.  Neurological: Positive for headaches. Negative for syncope.  Hematological: Does not bruise/bleed easily.  Psychiatric/Behavioral: Negative for dysphoric mood. The patient is not nervous/anxious.        Objective:   Physical Exam Gen. Pleasant, well-nourished, in no distress, normal affect ENT - no lesions, no post nasal drip, claass 2 airway Neck: No JVD, no  thyromegaly, no carotid bruits Lungs: no use of accessory muscles, no dullness to percussion, decreased without rales or rhonchi , appear on coughing Cardiovascular: Rhythm regular, heart sounds  normal, no murmurs or gallops, no peripheral edema Abdomen: soft and non-tender, no hepatosplenomegaly, BS normal. Musculoskeletal: No deformities, no cyanosis or clubbing Neuro:  alert, non focal        Assessment & Plan:

## 2011-01-23 NOTE — Progress Notes (Signed)
Addended by: Zackery Barefoot on: 01/23/2011 05:17 PM   Modules accepted: Orders

## 2011-01-23 NOTE — Patient Instructions (Addendum)
Chest xray Blood work Breathing test Take ZYRTEC for allergies Nasal spray sample  Prednisone Codeine cough syrup as  needed

## 2011-01-24 LAB — CBC WITH DIFFERENTIAL/PLATELET
Basophils Absolute: 0.1 10*3/uL (ref 0.0–0.1)
Basophils Relative: 1 % (ref 0–1)
Eosinophils Relative: 3 % (ref 0–5)
HCT: 48.5 % (ref 39.0–52.0)
Hemoglobin: 17 g/dL (ref 13.0–17.0)
Lymphs Abs: 1 10*3/uL (ref 0.7–4.0)
MCH: 31.7 pg (ref 26.0–34.0)
MCHC: 35.1 g/dL (ref 30.0–36.0)
Monocytes Relative: 9 % (ref 3–12)
Neutro Abs: 8.1 10*3/uL — ABNORMAL HIGH (ref 1.7–7.7)
Neutrophils Relative %: 78 % — ABNORMAL HIGH (ref 43–77)
RDW: 13.3 % (ref 11.5–15.5)

## 2011-01-24 LAB — COMPREHENSIVE METABOLIC PANEL
Albumin: 4.3 g/dL (ref 3.5–5.2)
BUN: 23 mg/dL (ref 6–23)
CO2: 25 mEq/L (ref 19–32)
Chloride: 103 mEq/L (ref 96–112)
Creat: 1.15 mg/dL (ref 0.40–1.50)
Glucose, Bld: 86 mg/dL (ref 70–99)
Potassium: 4.6 mEq/L (ref 3.5–5.3)
Total Protein: 6.9 g/dL (ref 6.0–8.3)

## 2011-02-06 ENCOUNTER — Institutional Professional Consult (permissible substitution): Payer: 59 | Admitting: Critical Care Medicine

## 2011-02-09 ENCOUNTER — Encounter: Payer: Self-pay | Admitting: Pulmonary Disease

## 2011-02-13 ENCOUNTER — Encounter: Payer: Self-pay | Admitting: Pulmonary Disease

## 2011-02-13 ENCOUNTER — Ambulatory Visit (INDEPENDENT_AMBULATORY_CARE_PROVIDER_SITE_OTHER): Payer: 59 | Admitting: Pulmonary Disease

## 2011-02-13 VITALS — BP 124/86 | HR 77 | Temp 97.6°F | Ht 72.0 in | Wt 240.4 lb

## 2011-02-13 DIAGNOSIS — D860 Sarcoidosis of lung: Secondary | ICD-10-CM

## 2011-02-13 DIAGNOSIS — D869 Sarcoidosis, unspecified: Secondary | ICD-10-CM

## 2011-02-13 DIAGNOSIS — J99 Respiratory disorders in diseases classified elsewhere: Secondary | ICD-10-CM

## 2011-02-13 NOTE — Patient Instructions (Signed)
COntinue prednisone 10 mg x 2 weeks Then 5 m g x 2 weeks, then STOP  Get reports from [facility] dermatology Take pro-air 2 puffs as needed

## 2011-02-13 NOTE — Progress Notes (Signed)
  Subjective:    Patient ID: Jason Weaver, male    DOB: [birth year], 55 y.o.   MRN: [id number]  HPI Initial OV 01/23/11 [name], never smoker with sarcoidosis for evlauation of cough x 2 weeks  Cough - x 2 weeks, violent, gives him headaches  C/o Wheeze - tickling in throat  Gurgle, phlegm - green a wk ago, now white- yellow  Dulera, albuterol , MDI did not help, cough syrup makes him sleep -codeine, prednisone shot, then 20 mg x 10 ds - no relief  No spiecific aggravating / relieving factors, no preceding URI  Diagnosed with sarcoidosis in '97 at [facility], by 'exclusion',imaging & bronchoscopy - not TB, lymphoma  2011 - lost hearing on left -MRI neg for sarcoid of the brain,saw ENt & neurologist [facility] >> given high dose steroids, Ct chest done , report not available but ENT note reports Bl scattered sub cm nodules, CT from 2002 notes mediastinal Lymphadenopathy  He also reports skin lesions on chest , inconclusive biopsy [facility]) , given cream for LA. Spirometry >> nml lung function Rx - given longer course of prednisone, start at 40 mg & taper to 20 mg in 1 week , then stay at this level until FU visit  Labs- Hb 17 Take zyrtec + nasonex for allergic rhinitis  Codeine syrup for symptomatic relief prn   02/13/2011 cough has improved.  Prod at times with whitish yellow mucus.  wheezing at times but this has also improved.  Denies SOB. Stopped prednisone for   Tiredness Skin lesions unchanged on prednisone     Review of Systems Pt denies any significant  nasal congestion or excess secretions, fever, chills, sweats, unintended wt loss, pleuritic or exertional cp, orthopnea pnd or leg swelling.  Pt also denies any obvious fluctuation in symptoms with weather or environmental change or other alleviating or aggravating factors.    Pt denies any increase in rescue therapy over baseline, denies waking up needing it or having early am exacerbations or coughing/wheezing/ or dyspnea        Objective:   Physical Exam Gen. Pleasant, well-nourished, in no distress, normal affect ENT - no lesions, no post nasal drip, claass 2 airway Neck: No JVD, no thyromegaly, no carotid bruits Lungs: no use of accessory muscles, no dullness to percussion, decreased without rales or rhonchi , appear on coughing Cardiovascular: Rhythm regular, heart sounds  normal, no murmurs or gallops, no peripheral edema Abdomen: soft and non-tender, no hepatosplenomegaly, BS normal. Musculoskeletal: No deformities, no cyanosis or clubbing Neuro:  alert, non focal SKin - pink papules over ant chest wall, none on extremities        Assessment & Plan:

## 2011-02-14 ENCOUNTER — Encounter: Payer: Self-pay | Admitting: Pulmonary Disease

## 2011-02-14 NOTE — Assessment & Plan Note (Signed)
Likely sarcoid flare ? Unclear what his neurological symtoms were attributed to Reason, pollen, doubt viral - no preceding symptoms Spirometry shows preserved lung function COntinue prednisone 10 mg x 2 weeks Then 5 m g x 2 weeks, then STOP  Get reports from [facility] dermatology - skin lesions not typical for sarcoid OK to stop dulera - no airway obstruction, use pro air prn only  Hb level of 17 is of concern - check noct oximetry for desaturations, Also snoring s/o obstructive sleep apnea

## 2011-02-20 ENCOUNTER — Encounter: Payer: Self-pay | Admitting: Pulmonary Disease

## 2011-02-26 ENCOUNTER — Telehealth (INDEPENDENT_AMBULATORY_CARE_PROVIDER_SITE_OTHER): Payer: 59 | Admitting: Pulmonary Disease

## 2011-02-26 DIAGNOSIS — G4730 Sleep apnea, unspecified: Secondary | ICD-10-CM

## 2011-02-26 NOTE — Telephone Encounter (Signed)
ONO on RA shows desaturation for about 17 mins during sleep.  This may be related to some degree of sleep apnea rather than sarcoidosis. If he is agreebale can proceed with sleep study at home or in the lab. Would prefer to hold of on oxygen unless there is worsening of symptoms.

## 2011-02-27 NOTE — Telephone Encounter (Signed)
Spoke with pt and informed of RA's recs. Pt verbalized understanding. He is okay with either option, will agree with RA's suggestion.

## 2011-02-27 NOTE — Telephone Encounter (Signed)
Pl proceed with home sleep study - use Alice , if available. I do not know how to order from this note.

## 2011-02-28 ENCOUNTER — Encounter: Payer: Self-pay | Admitting: Pulmonary Disease

## 2011-02-28 DIAGNOSIS — G4730 Sleep apnea, unspecified: Secondary | ICD-10-CM

## 2011-02-28 NOTE — Progress Notes (Signed)
  This encounter was created in error - please disregard. This encounter was created in error - please disregard.  Done in error.

## 2011-02-28 NOTE — Telephone Encounter (Signed)
Called and spoke with pt about the apnea link device. Pt stated that he will be going out of town [employer]. Pt was given my direct phone number and will call me back next week to arrange set up for this device. Please send order not referral for apnea link.

## 2011-03-02 NOTE — Telephone Encounter (Signed)
Pl place order for home sleep study

## 2011-03-05 ENCOUNTER — Encounter: Payer: Self-pay | Admitting: Pulmonary Disease

## 2011-03-12 NOTE — Telephone Encounter (Signed)
Order was placed. Signing off of note. Bjorn Loser advised PA may take up to 15 days and one was needed for this pt.

## 2011-03-20 ENCOUNTER — Encounter: Payer: Self-pay | Admitting: Pulmonary Disease

## 2011-04-05 ENCOUNTER — Ambulatory Visit (INDEPENDENT_AMBULATORY_CARE_PROVIDER_SITE_OTHER): Payer: 59 | Admitting: Pulmonary Disease

## 2011-04-05 DIAGNOSIS — Z9189 Other specified personal risk factors, not elsewhere classified: Secondary | ICD-10-CM

## 2011-04-05 NOTE — Patient Instructions (Signed)
Pl let him know sleep study showed moderate obstructive sleep apnea  Can proceed with CPPA titration int he lab if he is in agreement

## 2011-04-05 NOTE — Progress Notes (Signed)
  Subjective:    Patient ID: Jason Weaver, male    DOB: Dec 27, 1955, 55 y.o.   MRN: [id number]  HPI P ortable study shows AHI of 1/h with nadir desatn of 79% & 19 min desatn < 88% Flow evaluation period 8h 28 min    Review of Systems     Objective:   Physical Exam        Assessment & Plan:

## 2011-04-05 NOTE — Assessment & Plan Note (Signed)
Will qualify for CPAP therapy

## 2011-04-06 NOTE — Progress Notes (Signed)
I informed pt of RA's findings and recommendations. Pt verbalized understanding. Order placed for CPAP with titration.

## 2011-04-06 NOTE — Progress Notes (Signed)
Addended by: Julaine Hua on: 04/06/2011 08:49 AM   Modules accepted: Orders

## 2011-04-12 ENCOUNTER — Encounter: Payer: Self-pay | Admitting: Pulmonary Disease

## 2011-04-27 ENCOUNTER — Other Ambulatory Visit: Payer: Self-pay | Admitting: Pulmonary Disease

## 2011-04-27 DIAGNOSIS — Z9189 Other specified personal risk factors, not elsewhere classified: Secondary | ICD-10-CM

## 2011-04-30 ENCOUNTER — Ambulatory Visit (HOSPITAL_BASED_OUTPATIENT_CLINIC_OR_DEPARTMENT_OTHER): Payer: 59

## 2011-05-14 ENCOUNTER — Ambulatory Visit (HOSPITAL_BASED_OUTPATIENT_CLINIC_OR_DEPARTMENT_OTHER): Payer: 59 | Attending: Pulmonary Disease

## 2011-05-14 DIAGNOSIS — G4733 Obstructive sleep apnea (adult) (pediatric): Secondary | ICD-10-CM | POA: Insufficient documentation

## 2011-05-14 DIAGNOSIS — D869 Sarcoidosis, unspecified: Secondary | ICD-10-CM | POA: Insufficient documentation

## 2011-05-14 DIAGNOSIS — Z9189 Other specified personal risk factors, not elsewhere classified: Secondary | ICD-10-CM

## 2011-05-14 DIAGNOSIS — R0989 Other specified symptoms and signs involving the circulatory and respiratory systems: Secondary | ICD-10-CM | POA: Insufficient documentation

## 2011-05-14 DIAGNOSIS — R0609 Other forms of dyspnea: Secondary | ICD-10-CM | POA: Insufficient documentation

## 2011-05-21 ENCOUNTER — Telehealth: Payer: Self-pay | Admitting: Pulmonary Disease

## 2011-05-21 DIAGNOSIS — G4733 Obstructive sleep apnea (adult) (pediatric): Secondary | ICD-10-CM

## 2011-05-21 DIAGNOSIS — D869 Sarcoidosis, unspecified: Secondary | ICD-10-CM

## 2011-05-21 DIAGNOSIS — R0989 Other specified symptoms and signs involving the circulatory and respiratory systems: Secondary | ICD-10-CM

## 2011-05-21 DIAGNOSIS — R0609 Other forms of dyspnea: Secondary | ICD-10-CM

## 2011-05-21 NOTE — Telephone Encounter (Signed)
CPAP titration showed OSA corrected by CPAP 11 cm with C flex +3 cm Have sent Rx to [facility].

## 2011-05-21 NOTE — Procedures (Signed)
NAME:  Jason Weaver, KINNE NO.:  [id number]  MEDICAL RECORD NO.:  [id number]          PATIENT TYPE:  OUT  LOCATION:  SLEEP CENTER                 FACILITY:  [facility]  PHYSICIAN:  Oretha Milch, MD      DATE OF BIRTH:  Mar 09, 1956  DATE OF STUDY:  05/14/2011                           NOCTURNAL POLYSOMNOGRAM  REFERRING PHYSICIAN:  Oretha Milch, MD  INDICATION FOR STUDY:  Mr.  Weaver is a 55 year old gentleman with sarcoidosis and loud snoring.  A portable sleep study showed significant oxygen desaturations.  At the time of the study, he weighed 240 pounds with a height of 72 inches, BMI of 33, neck size 17 inches.  EPWORTH SLEEPINESS SCORE:  4.  This CPAP titration study was performed with sleep technologist in attendance.  EEG, EOG, EKG, EMG, and respiratory parameters were recorded.  Sleep stages, arousals, limb movements, and respiratory data were scored according to criteria laid out by the American Academy of Sleep Medicine.  SLEEP ARCHITECTURE:  Lights off was at 10:18 p.m., lights on was at 4:58 a.m.  Total sleep time was 371 minutes with a sleep period time of 384 minutes and sleep efficiency of 93%.  Sleep latency was 15 minutes, latency to REM sleep was 70 minutes, and wake after sleep onset was 13 minutes.  Sleep stages as a percentage of total sleep time was N1 of 5%, N2 of 63%, N3 of 0%, and REM sleep 31% (160 minutes).  Supine sleep accounted for 90 minutes.  Supine REM sleep was noted.  Longest period of REM sleep was around midnight.  RESPIRATORY DATA:  Sleep time was initiated at 4 cm and titrated to a final level of 11 cm due to respiratory events and snoring at a level of 4 cm for 64 minutes, one hypopnea was noted with a lowest desaturation of 89% and a final level of 11 cm for 75 minutes of sleep including 29 minutes of REM sleep, one hypopnea was noted.  The lowest desaturation of 91%.  This appears to be the optimal level used during the  study.  AROUSAL DATA:  There were total of 35 arousals with an arousal index of 6 events per hour.  Most of these were spontaneous.  OXYGEN DATA:  The desaturation index was 5 events per hour.  The lowest desaturation was 88%.  He spent 0 minute with saturation less than 88%.  CARDIAC DATA:  The low heart rate was 34 beats per minute, the high heart rate recorded was an artifact.  MOVEMENT-PARASOMNIA:  The limb movement index was 15 events per hour. Most of these were not associated with arousals.  Limb movement arousal index was only 0.2 events per hour.  DISCUSSION:  He was desensitized with a standard Mirage full-face mask. Humidity was used and C-Flex setting of +3 cm was used.  Desaturations were noted that were not related to respiratory events.  IMPRESSIONS-RECOMMENDATIONS:  Impression: 1. Mild-to-moderate obstructive sleep apnea with hypopneas causing     sleep fragmentation and oxygen desaturation. 2. This was corrected by 11 cm with a full-face mask. 3. Limb movements were noted not associated with arousals. 4. No evidence of  cardiac arrhythmias or behavioral disturbance during     sleep. 5. Desaturations were noted not related to respiratory events.  These     may be related to underlying pulmonary disease.  Recommendations: 1. Treatment options at this degree of sleep disordered breathing     include weight loss and CPAP therapy.  Alternatively, oral     appliances can also be considered. 2. CPAP can be initiated at 11 cm with a full-face mask and a C-Flex     setting of +3 cm.  Compliance can be monitored at this level. 3. He should be cautioned against medications sedative side effects. 4. He should be advised against driving when sleepy.     Oretha Milch, MD Electronically Signed    RVA/MEDQ  D:  05/21/2011 12:30:13  T:  05/21/2011 16:10:96  Job:  [id number]

## 2011-05-22 NOTE — Telephone Encounter (Signed)
[email or web address].Jason Weaver, CMA  

## 2011-05-23 NOTE — Telephone Encounter (Signed)
LMOAM for patient to return my call.

## 2011-05-23 NOTE — Telephone Encounter (Signed)
PT RETURNED CALL  Jason Weaver  °

## 2011-05-29 NOTE — Telephone Encounter (Signed)
Spoke with patient today and he was quite upset that results of sleep study has never been reviewed with patient by physician. Pt is requesting appointment with Dr. Vassie Loll to go over sleep study results in office and to discuss results, treatment options, etc. Appointment has been scheduled for Monday 06/04/11 at 11:15. Pt is aware of appointment.

## 2011-05-30 NOTE — Telephone Encounter (Signed)
He was informed of home study results per JR note. OK to discuss on OV

## 2011-06-04 ENCOUNTER — Encounter: Payer: Self-pay | Admitting: Pulmonary Disease

## 2011-06-04 ENCOUNTER — Ambulatory Visit (INDEPENDENT_AMBULATORY_CARE_PROVIDER_SITE_OTHER): Payer: 59 | Admitting: Pulmonary Disease

## 2011-06-04 DIAGNOSIS — J99 Respiratory disorders in diseases classified elsewhere: Secondary | ICD-10-CM

## 2011-06-04 DIAGNOSIS — Z9189 Other specified personal risk factors, not elsewhere classified: Secondary | ICD-10-CM

## 2011-06-04 DIAGNOSIS — G4733 Obstructive sleep apnea (adult) (pediatric): Secondary | ICD-10-CM

## 2011-06-04 DIAGNOSIS — D860 Sarcoidosis of lung: Secondary | ICD-10-CM

## 2011-06-04 DIAGNOSIS — D869 Sarcoidosis, unspecified: Secondary | ICD-10-CM

## 2011-06-04 NOTE — Assessment & Plan Note (Signed)
Given nml lung function, no treatment for now, unless flare

## 2011-06-04 NOTE — Patient Instructions (Signed)
I would suggest trial of CPAP 11 cm with nasal mask

## 2011-06-04 NOTE — Progress Notes (Signed)
  Subjective:    Patient ID: Jason Weaver, male    DOB: Jan 26, 1956, 55 y.o.   MRN: [id number]  HPI  Initial OV 01/23/11  55/M, never smoker for FU of  Sarcoidosis & obstructive sleep apnea  Presented with Cough - x 2 weeks, violent, gives him headaches  C/o Wheeze - tickling in throat  Dulera, albuterol , MDI did not help, cough syrup makes him sleep -codeine, prednisone shot, then 20 mg x 10 ds - no relief  No spiecific aggravating / relieving factors, no preceding URI  Diagnosed with sarcoidosis in '97 at [facility], by 'exclusion',imaging & bronchoscopy - not TB, lymphoma  2011 - lost hearing on left -MRI neg for sarcoid of the brain,saw ENt & neurologist [facility] >> given high dose steroids, Ct chest done , report not available but ENT note reports Bl scattered sub cm nodules, CT from 2002 notes mediastinal Lymphadenopathy  He also reports skin lesions on chest , inconclusive biopsy [facility]) >> diagnosed as rosacea , given ABx & cream for LA,  skin lesions unchanged on prednisone Spirometry >> nml lung function  Rx - given longer course of prednisone x 3 wks, stopped for tiredness Labs- Hb 17  Take zyrtec + nasonex for allergic rhinitis  Codeine syrup for symptomatic relief prn   06/04/2011 Here to discuss obstructive sleep apnea  evaluation & plan Nocturnal wheeze still present Overnight  oximetry showed desaturations, Also snoring s/o obstructive sleep apnea  P ortable study showed AHI of 18/h with nadir desatn of 79% & 19 min desatn < 88%  Flow evaluation period 8h 28 min CPAP titration showed good control of events on 11 cm - he prefers nasal mask Some PLMs present but no arrousals   Review of Systems Patient denies significant dyspnea,cough, hemoptysis,  chest pain, palpitations, pedal edema, orthopnea, paroxysmal nocturnal dyspnea, lightheadedness, nausea, vomiting, abdominal or  leg pains       Objective:   Physical Exam  Gen. Pleasant, well-nourished, in no  distress, normal affect ENT - no lesions, no post nasal drip, class 2 airway Neck: No JVD, no thyromegaly, no carotid bruits Lungs: no use of accessory muscles, no dullness to percussion, clear without rales or rhonchi  Cardiovascular: Rhythm regular, heart sounds  normal, no murmurs or gallops, no peripheral edema Abdomen: soft and non-tender, no hepatosplenomegaly, BS normal. Musculoskeletal: No deformities, no cyanosis or clubbing Neuro:  alert, non focal       Assessment & Plan:

## 2011-06-04 NOTE — Assessment & Plan Note (Signed)
INitiate CPAP 11 cm, nasal mask, c flex 3 cm, humidity Weight loss encouraged, compliance with goal of at least 4-6 hrs every night is the expectation. Advised against medications with sedative side effects Cautioned against driving when sleepy - understanding that sleepiness will vary on a day to day basis

## 2011-06-22 ENCOUNTER — Telehealth: Payer: Self-pay | Admitting: Internal Medicine

## 2011-06-22 MED ORDER — ZOLPIDEM TARTRATE 10 MG PO TABS: 10.0000 mg | ORAL_TABLET | Freq: Every evening | ORAL | Status: DC | PRN

## 2011-06-22 NOTE — Telephone Encounter (Signed)
Patient is requesting rx for Ambien to help with sleep. Please call [facility].

## 2011-06-22 NOTE — Telephone Encounter (Signed)
I dont see on med list - please advise Last seen 01/01/11

## 2011-06-22 NOTE — Telephone Encounter (Signed)
Generic Ambien 10 mg #30 refills one

## 2011-07-13 ENCOUNTER — Telehealth: Payer: Self-pay | Admitting: Pulmonary Disease

## 2011-07-13 NOTE — Telephone Encounter (Signed)
Download reviewed - CPAP effective when used & cuts down all events His complianc eis very poor. PL let him know - compliance with goal of at least 4-6 hrs every night is the expectation.

## 2011-07-16 ENCOUNTER — Ambulatory Visit (INDEPENDENT_AMBULATORY_CARE_PROVIDER_SITE_OTHER): Payer: 59 | Admitting: Pulmonary Disease

## 2011-07-16 ENCOUNTER — Encounter: Payer: Self-pay | Admitting: Pulmonary Disease

## 2011-07-16 DIAGNOSIS — D869 Sarcoidosis, unspecified: Secondary | ICD-10-CM

## 2011-07-16 DIAGNOSIS — G4733 Obstructive sleep apnea (adult) (pediatric): Secondary | ICD-10-CM

## 2011-07-16 DIAGNOSIS — J99 Respiratory disorders in diseases classified elsewhere: Secondary | ICD-10-CM

## 2011-07-16 DIAGNOSIS — D860 Sarcoidosis of lung: Secondary | ICD-10-CM

## 2011-07-16 MED ORDER — AZITHROMYCIN 250 MG PO TABS
ORAL_TABLET | ORAL | Status: AC
Start: 2011-07-16 — End: 2011-07-21

## 2011-07-16 NOTE — Patient Instructions (Addendum)
YOu are going through an adjustment period with the mask Trial of chin strap Call sleep lab [phone] - to troubleshoot whistling noise Trial of albuerol MDI  Z-pak x 5 ds

## 2011-07-16 NOTE — Progress Notes (Signed)
  Subjective:    Patient ID: Jason Weaver, male    DOB: [birth year], 55 y.o.   MRN: [id number]  HPI  55/M, never smoker for FU of  Sarcoidosis & obstructive sleep apnea  Presented 4/12 with Cough &  Wheeze - tickling in throat  Dulera, albuterol , MDI did not help, cough syrup makes him sleep -codeine, prednisone shot, then 20 mg x 10 ds - no relief   He was diagnosed with sarcoidosis in '97 at [facility], by 'exclusion',imaging & bronchoscopy - not TB, lymphoma  2011 - lost hearing on left -MRI neg for sarcoid of the brain,saw ENt & neurologist at [facility] >> given high dose steroids, Ct chest done , report not available but ENT note reports Bl scattered sub cm nodules, CT from 2002 notes mediastinal Lymphadenopathy  He also reports skin lesions on chest , inconclusive biopsy [facility]) >> diagnosed as rosacea , given ABx & cream for LA,  skin lesions unchanged on prednisone Spirometry >> nml lung function  Rx - given longer course of prednisone x 3 wks, stopped for tiredness Labs- Hb 17  Take zyrtec + nasonex for allergic rhinitis  Codeine syrup for symptomatic relief prn   Overnight  oximetry showed desaturations, Also snoring s/o obstructive sleep apnea  P ortable study showed AHI of 18/h with nadir desatn of 79% & 19 min desatn < 88%  Flow evaluation period 8h 28 min CPAP titration showed good control of events on 11 cm - he prefers nasal mask Some PLMs present but no arousals  07/16/2011 INitiated CPAP 11 cm 8/12 Download reviewed - CPAP effective when used & cuts down all events  His compliance is very poor - Pt states [place] and has not been able to use CPAP machine. Exhaling causes whistling, pulls mask off after 1 week Wheezing at night, today c/o productive green phlegm, 'congestion'  Review of Systems Patient denies significant dyspnea,cough, hemoptysis,  chest pain, palpitations, pedal edema, orthopnea, paroxysmal nocturnal dyspnea,  lightheadedness, nausea, vomiting, abdominal or  leg pains      Objective:   Physical Exam Gen. Pleasant, well-nourished, in no distress, normal affect ENT - no lesions, no post nasal drip Neck: No JVD, no thyromegaly, no carotid bruits Lungs: no use of accessory muscles, no dullness to percussion, clear without rales or rhonchi  Cardiovascular: Rhythm regular, heart sounds  normal, no murmurs or gallops, no peripheral edema Abdomen: soft and non-tender, no hepatosplenomegaly, BS normal. Musculoskeletal: No deformities, no cyanosis or clubbing Neuro:  alert, non focal        Assessment & Plan:

## 2011-07-17 NOTE — Telephone Encounter (Signed)
I informed pt of RA's findings and recommendations. Pt verbalized understanding  

## 2011-07-18 ENCOUNTER — Encounter: Payer: Self-pay | Admitting: Pulmonary Disease

## 2011-07-19 NOTE — Assessment & Plan Note (Signed)
Discussed ways to improve compliance YOu are going through an adjustment period with the mask Trial of chin strap Call sleep lab [phone] - to troubleshoot whistling noise Weight loss encouraged, compliance with goal of at least 4-6 hrs every night is the expectation. Advised against medications with sedative side effects Cautioned against driving when sleepy - understanding that sleepiness will vary on a day to day basis

## 2011-07-19 NOTE — Assessment & Plan Note (Signed)
Trial of albuterol MDI for nocturnal wheezing Z-pak x 5 ds for acute bronchitis

## 2011-08-19 ENCOUNTER — Telehealth: Payer: Self-pay | Admitting: Pulmonary Disease

## 2011-08-19 DIAGNOSIS — G4733 Obstructive sleep apnea (adult) (pediatric): Secondary | ICD-10-CM

## 2011-08-19 NOTE — Telephone Encounter (Signed)
CPAP is effective when used but usage remains poor. Is there anyway we can help him for comfort to increase usage ? Has he contacted the sleep lab or gotten chin strap?

## 2011-08-22 ENCOUNTER — Encounter: Payer: Self-pay | Admitting: Pulmonary Disease

## 2011-08-24 NOTE — Telephone Encounter (Signed)
lmomtcb x1 

## 2011-08-28 NOTE — Telephone Encounter (Signed)
I spoke with pt and he states he would like to be sent over to sleep lab for mask fit. I advised pt will send order to [facility] to have him set up with an apt at the sleep lab. Pt verbalized understanding and needed nothing further. Order has been sent

## 2011-08-28 NOTE — Telephone Encounter (Signed)
He will just have to play around with it to get the right fit. If he has not done this already, can make an appt  With sleep lab [phone] - to ensure good mask fit

## 2011-08-28 NOTE — Telephone Encounter (Signed)
Whistle noise during sleep with exhalation, discomfort of mask "can not sleep on my side" because it leaks, and has to sleep on back. Chin strap is helping a little. Dr. Vassie Loll, please advise. Thanks

## 2011-09-03 ENCOUNTER — Ambulatory Visit (HOSPITAL_BASED_OUTPATIENT_CLINIC_OR_DEPARTMENT_OTHER): Payer: 59 | Attending: Pulmonary Disease

## 2011-09-03 ENCOUNTER — Telehealth (HOSPITAL_BASED_OUTPATIENT_CLINIC_OR_DEPARTMENT_OTHER): Payer: Self-pay | Admitting: Radiology

## 2011-09-03 DIAGNOSIS — G4733 Obstructive sleep apnea (adult) (pediatric): Secondary | ICD-10-CM

## 2011-09-11 ENCOUNTER — Other Ambulatory Visit (HOSPITAL_BASED_OUTPATIENT_CLINIC_OR_DEPARTMENT_OTHER): Payer: 59

## 2011-09-23 ENCOUNTER — Telehealth: Payer: Self-pay | Admitting: Pulmonary Disease

## 2011-09-23 NOTE — Telephone Encounter (Signed)
Seems on download that he is not using CPAP anymore. Is there anything we can do to help? Seems like there was no solution for the whistling noise from his machine. Should we dc CPAP altogether?

## 2011-09-24 NOTE — Telephone Encounter (Signed)
lmomtcb x1 

## 2011-09-28 NOTE — Telephone Encounter (Signed)
Pt returning call can be reached at [phone].Jason Weaver

## 2011-10-03 NOTE — Telephone Encounter (Signed)
lmomtcb  

## 2011-10-04 NOTE — Telephone Encounter (Signed)
Pt is returning Jessica's call & can be reached at his work number, [phone].  Antionette Fairy

## 2011-10-04 NOTE — Telephone Encounter (Signed)
lmomtcb x1 

## 2011-10-18 NOTE — Telephone Encounter (Signed)
lmomtcb x 2  

## 2012-01-11 ENCOUNTER — Encounter: Payer: Self-pay | Admitting: Internal Medicine

## 2012-01-11 ENCOUNTER — Ambulatory Visit (INDEPENDENT_AMBULATORY_CARE_PROVIDER_SITE_OTHER): Payer: 59 | Admitting: Internal Medicine

## 2012-01-11 VITALS — BP 130/84 | Temp 98.0°F | Wt 246.0 lb

## 2012-01-11 DIAGNOSIS — M109 Gout, unspecified: Secondary | ICD-10-CM

## 2012-01-11 DIAGNOSIS — M778 Other enthesopathies, not elsewhere classified: Secondary | ICD-10-CM

## 2012-01-11 DIAGNOSIS — G4733 Obstructive sleep apnea (adult) (pediatric): Secondary | ICD-10-CM

## 2012-01-11 DIAGNOSIS — M65839 Other synovitis and tenosynovitis, unspecified forearm: Secondary | ICD-10-CM

## 2012-01-11 DIAGNOSIS — D860 Sarcoidosis of lung: Secondary | ICD-10-CM

## 2012-01-11 DIAGNOSIS — M25519 Pain in unspecified shoulder: Secondary | ICD-10-CM

## 2012-01-11 MED ORDER — NABUMETONE 500 MG PO TABS: 500.0000 mg | ORAL_TABLET | Freq: Two times a day (BID) | ORAL | Status: DC

## 2012-01-11 MED ORDER — METHYLPREDNISOLONE ACETATE 80 MG/ML IJ SUSP
80.0000 mg | Freq: Once | INTRAMUSCULAR | Status: AC
  Administered 2012-01-11: 80 mg via INTRAMUSCULAR

## 2012-01-11 NOTE — Patient Instructions (Signed)
Limit your sodium (Salt) intake    It is important that you exercise regularly, at least 20 minutes 3 to 4 times per week.  If you develop chest pain or shortness of breath seek  medical attention.  Call or return to clinic prn if these symptoms worsen or fail to improve as anticipated.  

## 2012-01-11 NOTE — Progress Notes (Signed)
  Subjective:    Patient ID: Jason Weaver, male    DOB: 26-Dec-1955, 56 y.o.   MRN: [id number]  HPI  56 year old patient who is followed closely by pulmonary medicine dated 2 pulmonary sarcoid as well as obstructive sleep apnea. Complaints today include left upper back pain. This has been present since a fall that occurred in January. He has received chiropractic manipulation as well as the massage therapy. He also complains of chronic left wrist pain. He has been seen by orthopedics as well as rheumatology and was told that the wrist pain was due to gouty  Arthritis.  He has been on allopurinol therapy. Pain is aggravated by attempting to play golf    Review of Systems  Musculoskeletal:       Left upper back and left wrist pain       Objective:   Physical Exam  Constitutional: He appears well-developed and well-nourished. No distress.  Musculoskeletal:       Nontender subcutaneous nodule in the left upper back region that has been chronic No focal tenderness Range of motion left shoulder appear to be intact  Examination of the left wrist revealed the mild tenderness over the extensor surface both medially and laterally          Assessment & Plan:   Left upper back pain appears to be a musculoligamentous. Will start daily anti-inflammatory medications. We'll continue the massage therapy and stretching exercises. Followup rheumatology if unimproved Left wrist pain probable tendinitis. We'll continue daily Relafen. Followup rheumatology if unimproved for consideration of injection therapy  CPX in 6 months

## 2012-05-01 ENCOUNTER — Telehealth: Payer: Self-pay | Admitting: Internal Medicine

## 2012-05-01 DIAGNOSIS — R0989 Other specified symptoms and signs involving the circulatory and respiratory systems: Secondary | ICD-10-CM

## 2012-05-01 DIAGNOSIS — G4733 Obstructive sleep apnea (adult) (pediatric): Secondary | ICD-10-CM

## 2012-05-01 DIAGNOSIS — R05 Cough: Secondary | ICD-10-CM

## 2012-05-01 DIAGNOSIS — R059 Cough, unspecified: Secondary | ICD-10-CM

## 2012-05-01 NOTE — Telephone Encounter (Signed)
Order done

## 2012-05-01 NOTE — Telephone Encounter (Signed)
Please advise 

## 2012-05-01 NOTE — Telephone Encounter (Signed)
Pt would like a referral to [facility] pulmonologist Dr Arlean Hopping [phone] for wheezing and cough.  Pt does not want to return to [facility]

## 2012-05-01 NOTE — Telephone Encounter (Signed)
OK please refer

## 2012-05-02 ENCOUNTER — Other Ambulatory Visit (INDEPENDENT_AMBULATORY_CARE_PROVIDER_SITE_OTHER): Payer: 59

## 2012-05-02 ENCOUNTER — Other Ambulatory Visit: Payer: 59

## 2012-05-02 DIAGNOSIS — Z0000 Encounter for general adult medical examination without abnormal findings: Secondary | ICD-10-CM

## 2012-05-02 LAB — POCT URINALYSIS DIPSTICK
Leukocytes, UA: NEGATIVE
Protein, UA: NEGATIVE
Spec Grav, UA: 1.02
Urobilinogen, UA: 0.2
pH, UA: 5.5

## 2012-05-02 LAB — CBC WITH DIFFERENTIAL/PLATELET
Basophils Absolute: 0 10*3/uL (ref 0.0–0.1)
Lymphocytes Relative: 10.6 % — ABNORMAL LOW (ref 12.0–46.0)
Monocytes Relative: 11.9 % (ref 3.0–12.0)
Neutrophils Relative %: 69.4 % (ref 43.0–77.0)
Platelets: 232 10*3/uL (ref 150.0–400.0)
RDW: 13.8 % (ref 11.5–14.6)

## 2012-05-02 LAB — HEPATIC FUNCTION PANEL
AST: 23 U/L (ref 0–37)
Alkaline Phosphatase: 73 U/L (ref 39–117)
Total Bilirubin: 0.8 mg/dL (ref 0.3–1.2)

## 2012-05-02 LAB — BASIC METABOLIC PANEL
BUN: 19 mg/dL (ref 6–23)
Calcium: 9.9 mg/dL (ref 8.4–10.5)
Creatinine, Ser: 0.9 mg/dL (ref 0.4–1.5)
GFR: 88.82 mL/min (ref >60.00)
Glucose, Bld: 98 mg/dL (ref 70–99)
Sodium: 138 mEq/L (ref 135–145)

## 2012-05-02 LAB — PSA: PSA: 1.12 ng/mL (ref 0.10–4.00)

## 2012-05-02 LAB — LIPID PANEL
HDL: 59.4 mg/dL (ref >39.00)
Triglycerides: 190 mg/dL — ABNORMAL HIGH (ref 0.0–149.0)
VLDL: 38 mg/dL (ref 0.0–40.0)

## 2012-05-13 ENCOUNTER — Encounter: Payer: Self-pay | Admitting: Internal Medicine

## 2012-05-13 ENCOUNTER — Ambulatory Visit (INDEPENDENT_AMBULATORY_CARE_PROVIDER_SITE_OTHER): Payer: 59 | Admitting: Internal Medicine

## 2012-05-13 VITALS — BP 124/80 | HR 72 | Temp 98.2°F | Resp 20 | Ht 72.5 in | Wt 240.0 lb

## 2012-05-13 DIAGNOSIS — D860 Sarcoidosis of lung: Secondary | ICD-10-CM

## 2012-05-13 DIAGNOSIS — J99 Respiratory disorders in diseases classified elsewhere: Secondary | ICD-10-CM

## 2012-05-13 DIAGNOSIS — Z86010 Personal history of colonic polyps: Secondary | ICD-10-CM

## 2012-05-13 DIAGNOSIS — E785 Hyperlipidemia, unspecified: Secondary | ICD-10-CM

## 2012-05-13 DIAGNOSIS — M109 Gout, unspecified: Secondary | ICD-10-CM

## 2012-05-13 DIAGNOSIS — Z0000 Encounter for general adult medical examination without abnormal findings: Secondary | ICD-10-CM

## 2012-05-13 DIAGNOSIS — D869 Sarcoidosis, unspecified: Secondary | ICD-10-CM

## 2012-05-13 MED ORDER — FLUTICASONE PROPIONATE 50 MCG/ACT NA SUSP: 2.0000 | Freq: Every day | NASAL | Status: DC

## 2012-05-13 MED ORDER — NABUMETONE 500 MG PO TABS: 500.0000 mg | ORAL_TABLET | Freq: Two times a day (BID) | ORAL | Status: DC

## 2012-05-13 MED ORDER — ALLOPURINOL 300 MG PO TABS: 300.0000 mg | ORAL_TABLET | Freq: Every day | ORAL | Status: DC | PRN

## 2012-05-13 MED ORDER — ZOLPIDEM TARTRATE 10 MG PO TABS: 10.0000 mg | ORAL_TABLET | Freq: Every evening | ORAL | Status: DC | PRN

## 2012-05-13 MED ORDER — EZETIMIBE 10 MG PO TABS: 10.0000 mg | ORAL_TABLET | Freq: Every day | ORAL | Status: DC

## 2012-05-13 MED ORDER — LEVALBUTEROL TARTRATE 45 MCG/ACT IN AERO: 1.0000 | INHALATION_SPRAY | RESPIRATORY_TRACT | Status: DC | PRN

## 2012-05-13 MED ORDER — KETOCONAZOLE 2 % EX CREA: TOPICAL_CREAM | Freq: Two times a day (BID) | CUTANEOUS | Status: DC

## 2012-05-13 MED ORDER — HYDROCODONE-ACETAMINOPHEN 5-500 MG PO TABS: 1.0000 | ORAL_TABLET | Freq: Four times a day (QID) | ORAL | Status: DC | PRN

## 2012-05-13 NOTE — Patient Instructions (Signed)
Limit your sodium (Salt) intake    It is important that you exercise regularly, at least 20 minutes 3 to 4 times per week.  If you develop chest pain or shortness of breath seek  medical attention.  He is a nonsedating antihistamine such as Zyrtec Allegra or Claritin daily  Return in one year for follow-up  Pulmonary followup as discussed

## 2012-05-13 NOTE — Progress Notes (Signed)
Subjective:    Patient ID: Jason Weaver, male    DOB: 1956/08/12, 56 y.o.   MRN: [id number]  HPI  56 year old patient who is seen today for an annual exam. He is followed by pulmonary medicine did 2 pulmonary sarcoid as well as obstructive sleep apnea. He is doing reasonably well. His chief complaint today is cough this out related to a postnasal drip. He has a history of dyslipidemia with a high HDL. He has been intolerant of at least to statins in the past with myalgias with an earlier generic and then an episode of angioedema with Crestor. He has a history of gout and hyperuricemia which has been controlled on allopurinol. He has a history of vestibular dysfunction and left-sided hearing loss.  Family history. Father age 62 has heart failure and coronary artery disease status post stenting. Mother is 17 his unremarkably good health. One brother has some alcohol issues but otherwise healthy. One sister has a history of breast cancer in remission  Past Medical History  Diagnosis Date  . Hyperlipidemia   . ED (erectile dysfunction)   . Gout   . Diverticulosis of colon   . Hx of colonic polyps   . Nasal septal deviation   . Obesity   . Hearing loss   . 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 Marital Status: Single    Spouse Name: N/A    Number of Children: N/A  . Years of Education: N/A   Occupational History  . Emergency planning/management officer    Social History Main Topics  . Smoking status: Never Smoker   . Smokeless tobacco: Never Used  . Alcohol Use: Yes  . Drug Use: Not on file  . Sexually Active: Not on file   Other Topics Concern  . Not on file   Social History Narrative  . No narrative on file    Past Surgical History  Procedure Date  . Hernia repair 2009  . Tonsillectomy 1979  . Cataract extraction   . Lung biopsy 1997    Family History  Problem Relation Age of Onset  . COPD Father     was a smoker  . Arthritis Father   .  Asthma Sister   . Cancer Sister     No Known Allergies  Current Outpatient Prescriptions on File Prior to Visit  Medication Sig Dispense Refill  . albuterol (PROAIR HFA) 108 (90 BASE) MCG/ACT inhaler Inhale 2 puffs into the lungs every 6 (six) hours as needed.        Marland Kitchen allopurinol (ZYLOPRIM) 300 MG tablet Take 300 mg by mouth daily as needed.       . Ascorbic Acid (VITAMIN C) 500 MG tablet Take 500 mg by mouth daily.        Marland Kitchen HYDROcodone-acetaminophen (VICODIN) 5-500 MG per tablet Take 1 tablet by mouth every 6 (six) hours as needed. RX'd by ortho      . ketoconazole (NIZORAL) 2 % cream As directed       . nabumetone (RELAFEN) 500 MG tablet Take 1 tablet (500 mg total) by mouth 2 (two) times daily. 2 tabs bid as needed  90 tablet  6  . Phenylephrine-DM-GG (MUCINEX FAST-MAX CONGEST COUGH) 2.5-5-100 MG/5ML LIQD Take by mouth as needed.        . Pseudoeph-Doxylamine-DM-APAP (NYQUIL) 60-7.03-20-999 MG/30ML LIQD Take by mouth as needed.        . valACYclovir (VALTREX) 1000  MG tablet Take 1 tablet by mouth as needed.        BP 124/80  Pulse 72  Temp 98.2 F (36.8 C) (Oral)  Resp 20  Ht 6' 0.5" (1.842 m)  Wt 240 lb (108.863 kg)  BMI 32.10 kg/m2  SpO2 98%       Review of Systems  Constitutional: Negative for fever, chills, activity change, appetite change and fatigue.  HENT: Positive for hearing loss. Negative for ear pain, congestion, rhinorrhea, sneezing, mouth sores, trouble swallowing, neck pain, neck stiffness, dental problem, voice change, sinus pressure and tinnitus.   Eyes: Negative for photophobia, pain, redness and visual disturbance.  Respiratory: Negative for apnea, cough, choking, chest tightness, shortness of breath and wheezing.   Cardiovascular: Negative for chest pain, palpitations and leg swelling.  Gastrointestinal: Negative for nausea, vomiting, abdominal pain, diarrhea, constipation, blood in stool, abdominal distention, anal bleeding and rectal pain.    Genitourinary: Negative for dysuria, urgency, frequency, hematuria, flank pain, decreased urine volume, discharge, penile swelling, scrotal swelling, difficulty urinating, genital sores and testicular pain.  Musculoskeletal: Positive for gait problem. Negative for myalgias, back pain, joint swelling and arthralgias.  Skin: Negative for color change, rash and wound.  Neurological: Negative for dizziness, tremors, seizures, syncope, facial asymmetry, speech difficulty, weakness, light-headedness, numbness and headaches.  Hematological: Negative for adenopathy. Does not bruise/bleed easily.  Psychiatric/Behavioral: Negative for suicidal ideas, hallucinations, behavioral problems, confusion, disturbed wake/sleep cycle, self-injury, dysphoric mood, decreased concentration and agitation. The patient is not nervous/anxious.        Objective:   Physical Exam  Constitutional: He appears well-developed and well-nourished. No distress.  HENT:  Head: Normocephalic and atraumatic.  Right Ear: External ear normal.  Left Ear: External ear normal.  Nose: Nose normal.  Mouth/Throat: Oropharynx is clear and moist.       Left-sided hearing loss Weber lateralizes to the right  Eyes: Conjunctivae and EOM are normal. Pupils are equal, round, and reactive to light. No scleral icterus.  Neck: Normal range of motion. Neck supple. No JVD present. No thyromegaly present.  Cardiovascular: Regular rhythm, normal heart sounds and intact distal pulses.  Exam reveals no gallop and no friction rub.   No murmur heard. Pulmonary/Chest: Effort normal and breath sounds normal. He exhibits no tenderness.  Abdominal: Soft. Bowel sounds are normal. He exhibits no distension and no mass. There is no tenderness.  Genitourinary: Prostate normal and penis normal.  Musculoskeletal: Normal range of motion. He exhibits no edema and no tenderness.  Lymphadenopathy:    He has no cervical adenopathy.  Neurological: He is alert. He  has normal reflexes. No cranial nerve deficit. Coordination normal.  Skin: Skin is warm and dry. No rash noted.  Psychiatric: He has a normal mood and affect. His behavior is normal.          Assessment & Plan:   Preventive health exam Colonic polyps. Will need followup colonoscopy in one year Dyslipidemia. Options discussed will continue a diet and avoid statin therapy at this time. We'll place on study at 10 mg daily Pulmonary sarcoid. Pulmonary followup in September Allergic rhinitis. Will place on fluticasone nasal as well as a nonsedating antihistamine

## 2012-05-29 DIAGNOSIS — R1310 Dysphagia, unspecified: Secondary | ICD-10-CM | POA: Insufficient documentation

## 2012-05-29 DIAGNOSIS — J309 Allergic rhinitis, unspecified: Secondary | ICD-10-CM | POA: Insufficient documentation

## 2012-06-18 ENCOUNTER — Ambulatory Visit (HOSPITAL_COMMUNITY)
Admission: RE | Admit: 2012-06-18 | Discharge: 2012-06-18 | Disposition: A | Discharge status: Home / Self Care | Payer: 59 | Source: Ambulatory Visit | Attending: Chiropractic Medicine | Admitting: Chiropractic Medicine

## 2012-06-18 ENCOUNTER — Other Ambulatory Visit (HOSPITAL_COMMUNITY): Payer: Self-pay | Admitting: Chiropractic Medicine

## 2012-06-18 DIAGNOSIS — R52 Pain, unspecified: Secondary | ICD-10-CM

## 2012-06-18 DIAGNOSIS — M47817 Spondylosis without myelopathy or radiculopathy, lumbosacral region: Secondary | ICD-10-CM | POA: Insufficient documentation

## 2012-07-17 ENCOUNTER — Telehealth: Payer: Self-pay | Admitting: Internal Medicine

## 2012-07-17 NOTE — Telephone Encounter (Signed)
Caller: Prather/Patient; Phone: [phone]; Reason for Call: Patient calling to get test results and to let Dr. Amador Cunas know he has stop taking his Cholesterol medication. Please call patient back. Thanks

## 2012-07-17 NOTE — Telephone Encounter (Signed)
Please advise 

## 2012-11-04 ENCOUNTER — Other Ambulatory Visit: Payer: Self-pay | Admitting: Internal Medicine

## 2012-11-04 ENCOUNTER — Encounter: Payer: Self-pay | Admitting: Internal Medicine

## 2012-11-04 ENCOUNTER — Ambulatory Visit (INDEPENDENT_AMBULATORY_CARE_PROVIDER_SITE_OTHER): Payer: 59 | Admitting: Internal Medicine

## 2012-11-04 VITALS — BP 130/80 | HR 92 | Temp 97.8°F | Resp 20 | Wt 244.0 lb

## 2012-11-04 DIAGNOSIS — K130 Diseases of lips: Secondary | ICD-10-CM

## 2012-11-04 DIAGNOSIS — J99 Respiratory disorders in diseases classified elsewhere: Secondary | ICD-10-CM

## 2012-11-04 DIAGNOSIS — D869 Sarcoidosis, unspecified: Secondary | ICD-10-CM

## 2012-11-04 DIAGNOSIS — G4733 Obstructive sleep apnea (adult) (pediatric): Secondary | ICD-10-CM

## 2012-11-04 DIAGNOSIS — D860 Sarcoidosis of lung: Secondary | ICD-10-CM

## 2012-11-04 MED ORDER — ZOLPIDEM TARTRATE 10 MG PO TABS: 10.0000 mg | ORAL_TABLET | Freq: Every evening | ORAL | Status: DC | PRN

## 2012-11-04 MED ORDER — MUPIROCIN 2 % EX OINT: TOPICAL_OINTMENT | Freq: Three times a day (TID) | CUTANEOUS | Status: DC

## 2012-11-04 MED ORDER — NABUMETONE 500 MG PO TABS: 500.0000 mg | ORAL_TABLET | Freq: Two times a day (BID) | ORAL | Status: DC

## 2012-11-04 MED ORDER — ALLOPURINOL 300 MG PO TABS: 300.0000 mg | ORAL_TABLET | Freq: Every day | ORAL | Status: DC | PRN

## 2012-11-04 MED ORDER — LEVALBUTEROL TARTRATE 45 MCG/ACT IN AERO: 1.0000 | INHALATION_SPRAY | RESPIRATORY_TRACT | Status: DC | PRN

## 2012-11-04 MED ORDER — HYDROCORTISONE 2.5 % RE CREA: TOPICAL_CREAM | Freq: Two times a day (BID) | RECTAL | Status: DC

## 2012-11-04 MED ORDER — HYDROCODONE-ACETAMINOPHEN 5-500 MG PO TABS: 1.0000 | ORAL_TABLET | Freq: Four times a day (QID) | ORAL | Status: DC | PRN

## 2012-11-04 MED ORDER — EZETIMIBE 10 MG PO TABS: 10.0000 mg | ORAL_TABLET | Freq: Every day | ORAL | Status: DC

## 2012-11-04 MED ORDER — SILDENAFIL CITRATE 100 MG PO TABS: 100.0000 mg | ORAL_TABLET | Freq: Every day | ORAL | Status: DC | PRN

## 2012-11-04 MED ORDER — FLUTICASONE PROPIONATE 50 MCG/ACT NA SUSP: 2.0000 | Freq: Every day | NASAL | Status: DC

## 2012-11-04 NOTE — Progress Notes (Signed)
Subjective:    Patient ID: Jason Weaver, male    DOB: Jan 17, 1956, 57 y.o.   MRN: [id number]  HPI  57 year old patient who presents with a number of concerns. He has a nasal lesion on the left which has been not bothersome for some weeks with irritation and discomfort. He also has an area of irritation involving the right side of his mouth it has been also difficult to heal. He also complains of painful hemorrhoids that intermittently bleed. He is requesting referral to a specialist for treatment  Past Medical History  Diagnosis Date  . Hyperlipidemia   . ED (erectile dysfunction)   . Gout   . Diverticulosis of colon   . Hx of colonic polyps   . Nasal septal deviation   . Obesity   . Hearing loss   . 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 Marital Status: Single    Spouse Name: N/A    Number of Children: N/A  . Years of Education: N/A   Occupational History  . Emergency planning/management officer    Social History Main Topics  . Smoking status: Never Smoker   . Smokeless tobacco: Never Used  . Alcohol Use: Yes  . Drug Use: Not on file  . Sexually Active: Not on file   Other Topics Concern  . Not on file   Social History Narrative  . No narrative on file    Past Surgical History  Procedure Date  . Hernia repair 2009  . Tonsillectomy 1979  . Cataract extraction   . Lung biopsy 1997    Family History  Problem Relation Age of Onset  . COPD Father     was a smoker  . Arthritis Father   . Asthma Sister   . Cancer Sister     No Known Allergies  Current Outpatient Prescriptions on File Prior to Visit  Medication Sig Dispense Refill  . allopurinol (ZYLOPRIM) 300 MG tablet Take 1 tablet (300 mg total) by mouth daily as needed.  90 tablet  6  . Ascorbic Acid (VITAMIN C) 500 MG tablet Take 500 mg by mouth daily.        Marland Kitchen ezetimibe (ZETIA) 10 MG tablet Take 1 tablet (10 mg total) by mouth daily.  90 tablet  3  . fluticasone (FLONASE) 50  MCG/ACT nasal spray Place 2 sprays into the nose daily.  16 g  6  . ketoconazole (NIZORAL) 2 % cream Apply topically 2 (two) times daily. As directed  15 g  3  . levalbuterol (XOPENEX HFA) 45 MCG/ACT inhaler Inhale 1-2 puffs into the lungs every 4 (four) hours as needed for wheezing.  1 Inhaler  12  . Phenylephrine-DM-GG (MUCINEX FAST-MAX CONGEST COUGH) 2.5-5-100 MG/5ML LIQD Take by mouth as needed.        . Pseudoeph-Doxylamine-DM-APAP (NYQUIL) 60-7.03-20-999 MG/30ML LIQD Take by mouth as needed.        . valACYclovir (VALTREX) 1000 MG tablet Take 1 tablet by mouth as needed.      . sildenafil (VIAGRA) 100 MG tablet Take 1 tablet (100 mg total) by mouth daily as needed for erectile dysfunction.  3 tablet  11  . zolpidem (AMBIEN) 10 MG tablet Take 1 tablet (10 mg total) by mouth at bedtime as needed for sleep.  30 tablet  2    BP 130/80  Pulse 92  Temp 97.8 F (36.6 C) (Oral)  Resp 20  Wt  244 lb (110.678 kg)  SpO2 98%      Review of Systems  HENT: Positive for hearing loss.   Skin: Positive for rash.       Objective:   Physical Exam  Constitutional: He appears well-developed and well-nourished. No distress.       Blood pressure 130/80  HENT:        A tiny papule noted involving the left  septal area consistent with small area of folliculitis  Small superficial fissure corner of the right mouth region  Pulmonary/Chest: Effort normal and breath sounds normal. No respiratory distress.          Assessment & Plan:   Angular chelitis Symptomatic hemorrhoids  Rx bactroban Sitz baths, anusol hc cream;  GI referral if unimproved

## 2012-11-04 NOTE — Patient Instructions (Addendum)
Limit your sodium (Salt) intake  Sitz Bath A sitz bath is a warm water bath taken in the sitting position that covers only the hips and buttocks. It may be used for either healing or hygiene purposes. Sitz baths are also used to relieve pain, itching, or muscle spasms. The water may contain medicine. Moist heat will help you heal and relax.   HOME CARE INSTRUCTIONS    Fill the bathtub half full with warm water.   Sit in the water and open the drain a little.   Turn on the warm water to keep the tub half full. Keep the water running constantly.   Soak in the water for 15 to 20 minutes.   After the sitz bath, pat the affected area dry first.   Take 3 to 4 sitz baths a day.  SEEK MEDICAL CARE IF:   You get worse instead of better. Stop the sitz baths if you get worse. MAKE SURE YOU:  Understand these instructions.   Will watch your condition.   Will get help right away if you are not doing well or get worse.  Document Released: 06/30/2004 Document Revised: 12/31/2011 Document Reviewed: 01/05/2011 [facility] Patient Information 2013 [place], [place].   Hemorrhoids Hemorrhoids are enlarged (dilated) veins around the rectum. There are 2 types of hemorrhoids, and the type of hemorrhoid is determined by its location.  Internal hemorrhoids occur in the veins just inside the rectum. They are usually not painful, but they may bleed. However, they may poke through to the outside and become irritated and painful. External hemorrhoids involve the veins outside the anus and can be felt as a painful swelling or hard lump near the anus. They are often itchy and may crack and bleed. Sometimes clots will form in the veins. This makes them swollen and painful. These are called thrombosed hemorrhoids. CAUSES Causes of hemorrhoids include:  Pregnancy. This increases the pressure in the hemorrhoidal veins.   Constipation.   Straining to have a bowel movement.   Obesity.   Heavy lifting or other  activity that caused you to strain.  TREATMENT Most of the time hemorrhoids improve in 1 to 2 weeks. However, if symptoms do not seem to be getting better or if you have a lot of rectal bleeding, your caregiver may perform a procedure to help make the hemorrhoids get smaller or remove them completely. Possible treatments include:  Rubber band ligation. A rubber band is placed at the base of the hemorrhoid to cut off the circulation.   Sclerotherapy. A chemical is injected to shrink the hemorrhoid.   Infrared light therapy. Tools are used to burn the hemorrhoid.   Hemorrhoidectomy. This is surgical removal of the hemorrhoid.  HOME CARE INSTRUCTIONS    Increase fiber in your diet. Ask your caregiver about using fiber supplements.   Drink enough water and fluids to keep your urine clear or pale yellow.   Exercise regularly.   Go to the bathroom when you have the urge to have a bowel movement. Do not wait.   Avoid straining to have bowel movements.   Keep the anal area dry and clean.   Only take over-the-counter or prescription medicines for pain, discomfort, or fever as directed by your caregiver.  If your hemorrhoids are thrombosed:  Take warm sitz baths for 20 to 30 minutes, 3 to 4 times per day.   If the hemorrhoids are very tender and swollen, place ice packs on the area as tolerated. Using ice packs between  sitz baths may be helpful. Fill a plastic bag with ice. Place a towel between the bag of ice and your skin.   Medicated creams and suppositories may be used or applied as directed.   Do not use a donut-shaped pillow or sit on the toilet for long periods. This increases blood pooling and pain.  SEEK MEDICAL CARE IF:    You have increasing pain and swelling that is not controlled with your medicine.   You have uncontrolled bleeding.   You have difficulty or you are unable to have a bowel movement.   You have pain or inflammation outside the area of the hemorrhoids.    You have chills or an oral temperature above 102 F (38.9 C).  MAKE SURE YOU:    Understand these instructions.   Will watch your condition.   Will get help right away if you are not doing well or get worse.  Document Released: 10/05/2000 Document Revised: 12/31/2011 Document Reviewed: 09/18/2010 [facility] Patient Information 2013 [place], [place].

## 2012-11-06 MED ORDER — ALBUTEROL SULFATE HFA 108 (90 BASE) MCG/ACT IN AERS: 2.0000 | INHALATION_SPRAY | Freq: Four times a day (QID) | RESPIRATORY_TRACT | Status: DC | PRN

## 2012-11-06 NOTE — Telephone Encounter (Signed)
Rx for Albuterol sent to pharmacy electronically.

## 2012-11-06 NOTE — Telephone Encounter (Signed)
Albuterol MDI #12 inhalations every 6 hours as needed (this probably is not available in a generic Ventolin and Proventil are two branded products)

## 2012-11-06 NOTE — Addendum Note (Signed)
Addended by: Jimmye Norman on: 11/06/2012 12:45 PM   Modules accepted: Orders

## 2012-12-18 ENCOUNTER — Ambulatory Visit (INDEPENDENT_AMBULATORY_CARE_PROVIDER_SITE_OTHER): Payer: 59 | Admitting: Internal Medicine

## 2012-12-18 ENCOUNTER — Encounter: Payer: Self-pay | Admitting: Internal Medicine

## 2012-12-18 VITALS — BP 130/80 | HR 76 | Temp 97.4°F | Resp 18 | Wt 248.0 lb

## 2012-12-18 DIAGNOSIS — T7840XA Allergy, unspecified, initial encounter: Secondary | ICD-10-CM

## 2012-12-18 DIAGNOSIS — J99 Respiratory disorders in diseases classified elsewhere: Secondary | ICD-10-CM

## 2012-12-18 DIAGNOSIS — D869 Sarcoidosis, unspecified: Secondary | ICD-10-CM

## 2012-12-18 DIAGNOSIS — D860 Sarcoidosis of lung: Secondary | ICD-10-CM

## 2012-12-18 MED ORDER — MONTELUKAST SODIUM 10 MG PO TABS: 10.0000 mg | ORAL_TABLET | Freq: Every day | ORAL | Status: DC

## 2012-12-18 MED ORDER — TRIAMCINOLONE ACETONIDE 0.1 % EX CREA: TOPICAL_CREAM | Freq: Two times a day (BID) | CUTANEOUS | Status: DC

## 2012-12-18 MED ORDER — ZOLPIDEM TARTRATE 10 MG PO TABS: 10.0000 mg | ORAL_TABLET | Freq: Every evening | ORAL | Status: DC | PRN

## 2012-12-18 MED ORDER — SILDENAFIL CITRATE 100 MG PO TABS: 100.0000 mg | ORAL_TABLET | Freq: Every day | ORAL | Status: DC | PRN

## 2012-12-18 NOTE — Progress Notes (Signed)
Subjective:    Patient ID: Jason Weaver, male    DOB: 11-20-1955, 57 y.o.   MRN: [id number]  HPI  57 year old patient who is followed by pulmonary medicine for pulmonary sarcoid. His chief complaint today is itching involving the his lower legs left greater than the right especially anteriorly. This becomes much worse at night anytime is quite warm and pruritic.   He also continues to have a chronic cough. This is associated with some upper airway irritation and drainage that persisted in spite of anti-histamines and Flonase. The cough is quite disruptive he is scheduled for pulmonary followup at [facility] in 2 months He is also complaining of some mild weakness myalgias. This occurred after doing considerable yard work over the weekend.  Past Medical History  Diagnosis Date  . Hyperlipidemia   . ED (erectile dysfunction)   . Gout   . Diverticulosis of colon   . Hx of colonic polyps   . Nasal septal deviation   . Obesity   . Hearing loss   . 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 Marital Status: Single    Spouse Name: N/A    Number of Children: N/A  . Years of Education: N/A   Occupational History  . Emergency planning/management officer    Social History Main Topics  . Smoking status: Never Smoker   . Smokeless tobacco: Never Used  . Alcohol Use: Yes  . Drug Use: Not on file  . Sexually Active: Not on file   Other Topics Concern  . Not on file   Social History Narrative  . No narrative on file    Past Surgical History  Procedure Laterality Date  . Hernia repair  2009  . Tonsillectomy  1979  . Cataract extraction    . Lung biopsy  1997    Family History  Problem Relation Age of Onset  . COPD Father     was a smoker  . Arthritis Father   . Asthma Sister   . Cancer Sister     No Known Allergies  Current Outpatient Prescriptions on File Prior to Visit  Medication Sig Dispense Refill  . albuterol (PROVENTIL HFA;VENTOLIN HFA) 108  (90 BASE) MCG/ACT inhaler Inhale 2 puffs into the lungs every 6 (six) hours as needed for wheezing.  1 Inhaler  11  . allopurinol (ZYLOPRIM) 300 MG tablet Take 1 tablet (300 mg total) by mouth daily as needed.  90 tablet  6  . Ascorbic Acid (VITAMIN C) 500 MG tablet Take 500 mg by mouth daily.        Marland Kitchen ezetimibe (ZETIA) 10 MG tablet Take 1 tablet (10 mg total) by mouth daily.  90 tablet  3  . fluticasone (FLONASE) 50 MCG/ACT nasal spray Place 2 sprays into the nose daily.  16 g  6  . HYDROcodone-acetaminophen (VICODIN) 5-500 MG per tablet Take 1 tablet by mouth every 6 (six) hours as needed. RX'd by ortho  60 tablet  3  . hydrocortisone (ANUSOL-HC) 2.5 % rectal cream Place rectally 2 (two) times daily.  30 g  3  . ketoconazole (NIZORAL) 2 % cream Apply topically 2 (two) times daily. As directed  15 g  3  . nabumetone (RELAFEN) 500 MG tablet Take 1 tablet (500 mg total) by mouth 2 (two) times daily. 2 tabs bid as needed  90 tablet  6  . Phenylephrine-DM-GG (MUCINEX FAST-MAX CONGEST COUGH) 2.5-5-100 MG/5ML  LIQD Take by mouth as needed.        . Pseudoeph-Doxylamine-DM-APAP (NYQUIL) 60-7.03-20-999 MG/30ML LIQD Take by mouth as needed.        . valACYclovir (VALTREX) 1000 MG tablet Take 1 tablet by mouth as needed.      Pauline Aus HFA 45 MCG/ACT inhaler INHALE 1-2 PUFFS INTO THE LUNGS EVERY 4 (FOUR) HOURS AS NEEDED FOR WHEEZING.  15 g  11  . zolpidem (AMBIEN) 10 MG tablet Take 1 tablet (10 mg total) by mouth at bedtime as needed for sleep.  30 tablet  3   No current facility-administered medications on file prior to visit.    BP 130/80  Pulse 76  Temp(Src) 97.4 F (36.3 C) (Oral)  Resp 18  Wt 248 lb (112.492 kg)  BMI 33.15 kg/m2  SpO2 96%       Review of Systems  Constitutional: Negative for fever, chills, appetite change and fatigue.  HENT: Positive for congestion and rhinorrhea. Negative for hearing loss, ear pain, sore throat, trouble swallowing, neck stiffness, dental problem, voice  change and tinnitus.   Eyes: Negative for pain, discharge and visual disturbance.  Respiratory: Positive for cough. Negative for chest tightness, wheezing and stridor.   Cardiovascular: Negative for chest pain, palpitations and leg swelling.  Gastrointestinal: Negative for nausea, vomiting, abdominal pain, diarrhea, constipation, blood in stool and abdominal distention.  Genitourinary: Negative for urgency, hematuria, flank pain, discharge, difficulty urinating and genital sores.  Musculoskeletal: Negative for myalgias, back pain, joint swelling, arthralgias and gait problem.  Skin: Positive for rash.  Neurological: Negative for dizziness, syncope, speech difficulty, weakness, numbness and headaches.  Hematological: Negative for adenopathy. Does not bruise/bleed easily.  Psychiatric/Behavioral: Negative for behavioral problems and dysphoric mood. The patient is not nervous/anxious.        Objective:   Physical Exam  Constitutional: He is oriented to person, place, and time. He appears well-developed.  HENT:  Head: Normocephalic.  Right Ear: External ear normal.  Left Ear: External ear normal.  Eyes: Conjunctivae and EOM are normal.  Neck: Normal range of motion.  Cardiovascular: Normal rate and normal heart sounds.   Pulmonary/Chest: Breath sounds normal.  Abdominal: Bowel sounds are normal.  Musculoskeletal: Normal range of motion. He exhibits no edema and no tenderness.  Neurological: He is alert and oriented to person, place, and time.  Skin:  Mildly dry skin involving the lower extremities with some excoriations involving the left anterior lower leg  Psychiatric: He has a normal mood and affect. His behavior is normal.          Assessment & Plan:   Dry skin- the patient has been asked to use a moisturizing cream twice daily. Was also given a prescription for triamcinolone cream to use twice daily local skin care discussed Allergic rhinitis. Cough is quite bothersome we'll  give a three-day trial of Singulair. He will discuss with pulmonary medicine and may consider allergy referral Pulmonary sarcoid

## 2012-12-18 NOTE — Patient Instructions (Signed)
Call or return to clinic prn if these symptoms worsen or fail to improve as anticipated.

## 2013-02-25 ENCOUNTER — Telehealth: Payer: Self-pay | Admitting: Internal Medicine

## 2013-02-25 NOTE — Telephone Encounter (Signed)
Patient Information:  Caller Name: Areeb  Phone: [phone]  Patient: Jason Weaver, Jason Weaver  Gender: Male  DOB: [birth year]  Age: 57 Years  PCP: Eleonore Chiquito [facility])  Office Follow Up:  Does the office need to follow up with this patient?: No  Instructions For The Office: N/A  RN Note:  Patient states he developed pain in the left lower side of his abdomen, onset 02/23/13. States pain is accomapnied by generalized aching, chills and fever per tactile. Patient states pain is constant with intermittent episodes of severe pain. Describes as "sharp." Denies nausea, vomiting or diarrhea. Denies urinary sx. States last bowel movement was 02/24/13 soft, brown in color. Care advice given per guidelines. Patient advised to be evaluated in the ED now. Patient advised nothing by mouth. Patient advised not to drive self. Patient verbalizes understanding and agreeable. Patient states he is going to [facility] for evaluation because he is currently in [facility].  Symptoms  Reason For Call & Symptoms: Abdominal pain  Reviewed Health History In EMR: Yes  Reviewed Medications In EMR: Yes  Reviewed Allergies In EMR: Yes  Reviewed Surgeries / Procedures: Yes  Date of Onset of Symptoms: 02/23/2013  Any Fever: Yes  Fever Taken: Tactile  Fever Time Of Reading: 00:00:00  Fever Last Reading: N/A  Guideline(s) Used:  Abdominal Pain - Male  Disposition Per Guideline:   Go to ED Now (or to Office with PCP Approval)  Reason For Disposition Reached:   Constant abdominal pain lasting > 2 hours  Advice Given:  Rest:  Lie down and rest until you feel better.  Call Back If:  You become worse.  RN Overrode Recommendation:  Go To ED  .

## 2013-02-27 ENCOUNTER — Ambulatory Visit (INDEPENDENT_AMBULATORY_CARE_PROVIDER_SITE_OTHER): Payer: BC Managed Care – PPO | Admitting: Internal Medicine

## 2013-02-27 ENCOUNTER — Encounter: Payer: Self-pay | Admitting: Internal Medicine

## 2013-02-27 VITALS — BP 140/90 | HR 68 | Temp 98.2°F | Resp 20 | Wt 241.0 lb

## 2013-02-27 DIAGNOSIS — K5730 Diverticulosis of large intestine without perforation or abscess without bleeding: Secondary | ICD-10-CM

## 2013-02-27 MED ORDER — METRONIDAZOLE 500 MG PO TABS: 500.0000 mg | ORAL_TABLET | Freq: Three times a day (TID) | ORAL | Status: DC

## 2013-02-27 MED ORDER — CIPROFLOXACIN HCL 500 MG PO TABS: 500.0000 mg | ORAL_TABLET | Freq: Two times a day (BID) | ORAL | Status: DC

## 2013-02-27 NOTE — Progress Notes (Signed)
Subjective:    Patient ID: Jason Weaver, male    DOB: 08/30/1956, 57 y.o.   MRN: [id number]  HPI  57 year old patient who has a history of known diverticulosis. 5 days ago he had the onset of fever and significant left lower quadrant pain. This intensified 2 days ago and he initially went to [facility] ED the left after an hour half of waiting. For the past 2 days his abdominal pain has improved and has largely resolved. He's had some mild constipation issues but otherwise his GI status has normalized. There's been no recent fever.  Past Medical History  Diagnosis Date  . Hyperlipidemia   . ED (erectile dysfunction)   . Gout   . Diverticulosis of colon   . Hx of colonic polyps   . Nasal septal deviation   . Obesity   . Hearing loss   . 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 Marital Status: Single    Spouse Name: N/A    Number of Children: N/A  . Years of Education: N/A   Occupational History  . Emergency planning/management officer    Social History Main Topics  . Smoking status: Never Smoker   . Smokeless tobacco: Never Used  . Alcohol Use: Yes  . Drug Use: Not on file  . Sexually Active: Not on file   Other Topics Concern  . Not on file   Social History Narrative  . No narrative on file    Past Surgical History  Procedure Laterality Date  . Hernia repair  2009  . Tonsillectomy  1979  . Cataract extraction    . Lung biopsy  1997    Family History  Problem Relation Age of Onset  . COPD Father     was a smoker  . Arthritis Father   . Asthma Sister   . Cancer Sister     No Known Allergies  Current Outpatient Prescriptions on File Prior to Visit  Medication Sig Dispense Refill  . albuterol (PROVENTIL HFA;VENTOLIN HFA) 108 (90 BASE) MCG/ACT inhaler Inhale 2 puffs into the lungs every 6 (six) hours as needed for wheezing.  1 Inhaler  11  . allopurinol (ZYLOPRIM) 300 MG tablet Take 1 tablet (300 mg total) by mouth daily as needed.  90  tablet  6  . Ascorbic Acid (VITAMIN C) 500 MG tablet Take 500 mg by mouth daily.        Marland Kitchen ezetimibe (ZETIA) 10 MG tablet Take 1 tablet (10 mg total) by mouth daily.  90 tablet  3  . fluticasone (FLONASE) 50 MCG/ACT nasal spray Place 2 sprays into the nose daily.  16 g  6  . HYDROcodone-acetaminophen (VICODIN) 5-500 MG per tablet Take 1 tablet by mouth every 6 (six) hours as needed. RX'd by ortho  60 tablet  3  . hydrocortisone (ANUSOL-HC) 2.5 % rectal cream Place rectally 2 (two) times daily.  30 g  3  . ketoconazole (NIZORAL) 2 % cream Apply topically 2 (two) times daily. As directed  15 g  3  . montelukast (SINGULAIR) 10 MG tablet Take 1 tablet (10 mg total) by mouth at bedtime.  30 tablet  3  . nabumetone (RELAFEN) 500 MG tablet Take 1 tablet (500 mg total) by mouth 2 (two) times daily. 2 tabs bid as needed  90 tablet  6  . Phenylephrine-DM-GG (MUCINEX FAST-MAX CONGEST COUGH) 2.5-5-100 MG/5ML LIQD Take by mouth as needed.        Marland Kitchen  Pseudoeph-Doxylamine-DM-APAP (NYQUIL) 60-7.03-20-999 MG/30ML LIQD Take by mouth as needed.        . sildenafil (VIAGRA) 100 MG tablet Take 1 tablet (100 mg total) by mouth daily as needed for erectile dysfunction.  3 tablet  11  . triamcinolone cream (KENALOG) 0.1 % Apply topically 2 (two) times daily.  60 g  2  . valACYclovir (VALTREX) 1000 MG tablet Take 1 tablet by mouth as needed.      Pauline Aus HFA 45 MCG/ACT inhaler INHALE 1-2 PUFFS INTO THE LUNGS EVERY 4 (FOUR) HOURS AS NEEDED FOR WHEEZING.  15 g  11  . zolpidem (AMBIEN) 10 MG tablet Take 1 tablet (10 mg total) by mouth at bedtime as needed for sleep.  30 tablet  2   No current facility-administered medications on file prior to visit.    BP 140/90  Pulse 68  Temp(Src) 98.2 F (36.8 C) (Oral)  Resp 20  Wt 241 lb (109.317 kg)  BMI 32.22 kg/m2  SpO2 98%       Review of Systems  Constitutional: Positive for fever, activity change and appetite change. Negative for chills and fatigue.  HENT: Negative  for hearing loss, ear pain, congestion, sore throat, trouble swallowing, neck stiffness, dental problem, voice change and tinnitus.   Eyes: Negative for pain, discharge and visual disturbance.  Respiratory: Negative for cough, chest tightness, wheezing and stridor.   Cardiovascular: Negative for chest pain, palpitations and leg swelling.  Gastrointestinal: Positive for abdominal pain. Negative for nausea, vomiting, diarrhea, constipation, blood in stool and abdominal distention.  Genitourinary: Negative for urgency, hematuria, flank pain, discharge, difficulty urinating and genital sores.  Musculoskeletal: Negative for myalgias, back pain, joint swelling, arthralgias and gait problem.  Skin: Negative for rash.  Neurological: Negative for dizziness, syncope, speech difficulty, weakness, numbness and headaches.  Hematological: Negative for adenopathy. Does not bruise/bleed easily.  Psychiatric/Behavioral: Negative for behavioral problems and dysphoric mood. The patient is not nervous/anxious.        Objective:   Physical Exam  Constitutional: He is oriented to person, place, and time. He appears well-developed.  HENT:  Head: Normocephalic.  Right Ear: External ear normal.  Left Ear: External ear normal.  Eyes: Conjunctivae and EOM are normal.  Neck: Normal range of motion.  Cardiovascular: Normal rate and normal heart sounds.   Pulmonary/Chest: Breath sounds normal.  Abdominal: Soft. Bowel sounds are normal. He exhibits no distension and no mass. There is no tenderness. There is no rebound and no guarding.  Musculoskeletal: Normal range of motion. He exhibits no edema and no tenderness.  Neurological: He is alert and oriented to person, place, and time.  Psychiatric: He has a normal mood and affect. His behavior is normal.          Assessment & Plan:   Diverticulosis. Probable mild  diverticular flare that has a large resolved. We'll continue to observe at this point. He was given  prescriptions for antibiotics  that he will not fill unless he develops issues over the weekend

## 2013-02-27 NOTE — Patient Instructions (Signed)
Diverticulitis A diverticulum is a small pouch or sac on the colon. Diverticulosis is the presence of these diverticula on the colon. Diverticulitis is the irritation (inflammation) or infection of diverticula. CAUSES  The colon and its diverticula contain bacteria. If food particles block the tiny opening to a diverticulum, the bacteria inside can grow and cause an increase in pressure. This leads to infection and inflammation and is called diverticulitis. SYMPTOMS   Abdominal pain and tenderness. Usually, the pain is located on the left side of your abdomen. However, it could be located elsewhere.  Fever.  Bloating.  Feeling sick to your stomach (nausea).  Throwing up (vomiting).  Abnormal stools. DIAGNOSIS  Your caregiver will take a history and perform a physical exam. Since many things can cause abdominal pain, other tests may be necessary. Tests may include:  Blood tests.  Urine tests.  X-ray of the abdomen.  CT scan of the abdomen. Sometimes, surgery is needed to determine if diverticulitis or other conditions are causing your symptoms. TREATMENT  Most of the time, you can be treated without surgery. Treatment includes:  Resting the bowels by only having liquids for a few days. As you improve, you will need to eat a low-fiber diet.  Intravenous (IV) fluids if you are losing body fluids (dehydrated).  Antibiotic medicines that treat infections may be given.  Pain and nausea medicine, if needed.  Surgery if the inflamed diverticulum has burst. HOME CARE INSTRUCTIONS   Try a clear liquid diet (broth, tea, or water for as long as directed by your caregiver). You may then gradually begin a low-fiber diet as tolerated. A low-fiber diet is a diet with less than 10 grams of fiber. Choose the foods below to reduce fiber in the diet:  White breads, cereals, rice, and pasta.  Cooked fruits and vegetables or soft fresh fruits and vegetables without the skin.  Ground or  well-cooked tender beef, ham, veal, lamb, pork, or poultry.  Eggs and seafood.  After your diverticulitis symptoms have improved, your caregiver may put you on a high-fiber diet. A high-fiber diet includes 14 grams of fiber for every 1000 calories consumed. For a standard 2000 calorie diet, you would need 28 grams of fiber. Follow these diet guidelines to help you increase the fiber in your diet. It is important to slowly increase the amount fiber in your diet to avoid gas, constipation, and bloating.  Choose whole-grain breads, cereals, pasta, and brown rice.  Choose fresh fruits and vegetables with the skin on. Do not overcook vegetables because the more vegetables are cooked, the more fiber is lost.  Choose more nuts, seeds, legumes, dried peas, beans, and lentils.  Look for food products that have greater than 3 grams of fiber per serving on the Nutrition Facts label.  Take all medicine as directed by your caregiver.  If your caregiver has given you a follow-up appointment, it is very important that you go. Not going could result in lasting (chronic) or permanent injury, pain, and disability. If there is any problem keeping the appointment, call to reschedule. SEEK MEDICAL CARE IF:   Your pain does not improve.  You have a hard time advancing your diet beyond clear liquids.  Your bowel movements do not return to normal. SEEK IMMEDIATE MEDICAL CARE IF:   Your pain becomes worse.  You have an oral temperature above 102 F (38.9 C), not controlled by medicine.  You have repeated vomiting.  You have bloody or black, tarry stools.  Symptoms  that brought you to your caregiver become worse or are not getting better. MAKE SURE YOU:   Understand these instructions.  Will watch your condition.  Will get help right away if you are not doing well or get worse. Document Released: 07/18/2005 Document Revised: 12/31/2011 Document Reviewed: 11/13/2010 [facility] Patient Information  2013 [place], [place]. Diverticulosis Diverticulosis is a common condition that develops when small pouches (diverticula) form in the wall of the colon. The risk of diverticulosis increases with age. It happens more often in people who eat a low-fiber diet. Most individuals with diverticulosis have no symptoms. Those individuals with symptoms usually experience abdominal pain, constipation, or loose stools (diarrhea). HOME CARE INSTRUCTIONS   Increase the amount of fiber in your diet as directed by your caregiver or dietician. This may reduce symptoms of diverticulosis.  Your caregiver may recommend taking a dietary fiber supplement.  Drink at least 6 to 8 glasses of water each day to prevent constipation.  Try not to strain when you have a bowel movement.  Your caregiver may recommend avoiding nuts and seeds to prevent complications, although this is still an uncertain benefit.  Only take over-the-counter or prescription medicines for pain, discomfort, or fever as directed by your caregiver. FOODS WITH HIGH FIBER CONTENT INCLUDE:  Fruits. Apple, peach, pear, tangerine, raisins, prunes.  Vegetables. Brussels sprouts, asparagus, broccoli, cabbage, carrot, cauliflower, romaine lettuce, spinach, summer squash, tomato, winter squash, zucchini.  Starchy Vegetables. Baked beans, kidney beans, lima beans, split peas, lentils, potatoes (with skin).  Grains. Whole wheat bread, brown rice, bran flake cereal, plain oatmeal, white rice, shredded wheat, bran muffins. SEEK IMMEDIATE MEDICAL CARE IF:   You develop increasing pain or severe bloating.  You have an oral temperature above 102 F (38.9 C), not controlled by medicine.  You develop vomiting or bowel movements that are bloody or black. Document Released: 07/05/2004 Document Revised: 12/31/2011 Document Reviewed: 03/08/2010 [facility] Patient Information 2013 [place], [place].

## 2013-06-19 ENCOUNTER — Telehealth: Payer: Self-pay | Admitting: Internal Medicine

## 2013-06-19 MED ORDER — ZOLPIDEM TARTRATE 10 MG PO TABS: 10.0000 mg | ORAL_TABLET | Freq: Every evening | ORAL | Status: DC | PRN

## 2013-06-19 NOTE — Telephone Encounter (Signed)
Left detailed message on personal voicemail Rx requested called into pharmacy.

## 2013-06-19 NOTE — Telephone Encounter (Signed)
Pt lost rx for ambien. Pt can rx into [facility]

## 2013-11-21 ENCOUNTER — Other Ambulatory Visit: Payer: Self-pay | Admitting: Internal Medicine

## 2013-12-16 ENCOUNTER — Ambulatory Visit (INDEPENDENT_AMBULATORY_CARE_PROVIDER_SITE_OTHER): Payer: BC Managed Care – PPO | Admitting: Internal Medicine

## 2013-12-16 ENCOUNTER — Encounter: Payer: Self-pay | Admitting: Internal Medicine

## 2013-12-16 VITALS — BP 140/90 | HR 68 | Temp 98.3°F | Resp 20 | Ht 72.5 in | Wt 242.0 lb

## 2013-12-16 DIAGNOSIS — J99 Respiratory disorders in diseases classified elsewhere: Secondary | ICD-10-CM

## 2013-12-16 DIAGNOSIS — R059 Cough, unspecified: Secondary | ICD-10-CM

## 2013-12-16 DIAGNOSIS — D869 Sarcoidosis, unspecified: Secondary | ICD-10-CM

## 2013-12-16 DIAGNOSIS — R05 Cough: Secondary | ICD-10-CM

## 2013-12-16 DIAGNOSIS — D860 Sarcoidosis of lung: Secondary | ICD-10-CM

## 2013-12-16 MED ORDER — HYDROCODONE-HOMATROPINE 5-1.5 MG/5ML PO SYRP: 5.0000 mL | ORAL_SOLUTION | Freq: Four times a day (QID) | ORAL | Status: AC | PRN

## 2013-12-16 MED ORDER — FLUTICASONE PROPIONATE 50 MCG/ACT NA SUSP: 2.0000 | Freq: Every day | NASAL | Status: DC

## 2013-12-16 MED ORDER — NABUMETONE 500 MG PO TABS: 500.0000 mg | ORAL_TABLET | Freq: Two times a day (BID) | ORAL | Status: DC

## 2013-12-16 NOTE — Patient Instructions (Signed)
You  may move around, but avoid painful motions and activities.   Acute sinusitis symptoms  are generally not helped by antibiotic therapy.  Use saline irrigation, warm  moist compresses and over-the-counter decongestants only as directed.  Call if there is no improvement in 5 to 7 days, or sooner if you develop increasing pain, fever, or any new symptoms.

## 2013-12-16 NOTE — Progress Notes (Signed)
Pre-visit discussion using our clinic review tool. No additional management support is needed unless otherwise documented below in the visit note.  

## 2013-12-16 NOTE — Progress Notes (Signed)
Subjective:    Patient ID: Jason Weaver, male    DOB: 09/04/1956, 58 y.o.   MRN: [id number]  HPI  58 year old patient who has a history of pulmonary sarcoidosis.  He presents with a three-week history of right lower arm pain.  This started after throwing objects for his dog to catch.  2 weeks ago he fell while skiing in [place] and injured his right shoulder.  His chief complaint also is cough.  He does have a history of pulmonary sarcoidosis and chronic cough, but this has worsened recently with sinus congestion and drainage.  Past Medical History  Diagnosis Date  . Hyperlipidemia   . ED (erectile dysfunction)   . Gout   . Diverticulosis of colon   . Hx of colonic polyps   . Nasal septal deviation   . Obesity   . Hearing loss   . 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 Marital Status: Single    Spouse Name: N/A    Number of Children: N/A  . Years of Education: N/A   Occupational History  . Government social research officer    Social History Main Topics  . Smoking status: Never Smoker   . Smokeless tobacco: Never Used  . Alcohol Use: Yes  . Drug Use: Not on file  . Sexual Activity: Not on file   Other Topics Concern  . Not on file   Social History Narrative  . No narrative on file    Past Surgical History  Procedure Laterality Date  . Hernia repair  2009  . Tonsillectomy  1979  . Cataract extraction    . Lung biopsy  1997    Family History  Problem Relation Age of Onset  . COPD Father     was a smoker  . Arthritis Father   . Asthma Sister   . Cancer Sister     No Known Allergies  Current Outpatient Prescriptions on File Prior to Visit  Medication Sig Dispense Refill  . allopurinol (ZYLOPRIM) 300 MG tablet Take 1 tablet (300 mg total) by mouth daily as needed.  90 tablet  6  . Ascorbic Acid (VITAMIN C) 500 MG tablet Take 500 mg by mouth daily.        . hydrocortisone (ANUSOL-HC) 2.5 % rectal cream Place rectally 2 (two) times  daily.  30 g  3  . ketoconazole (NIZORAL) 2 % cream Apply topically 2 (two) times daily. As directed  15 g  3  . montelukast (SINGULAIR) 10 MG tablet Take 1 tablet (10 mg total) by mouth at bedtime.  30 tablet  3  . PROAIR HFA 108 (90 BASE) MCG/ACT inhaler INHALE 2 PUFFS INTO THE LUNGS EVERY 6 (SIX) HOURS AS NEEDED FOR WHEEZING.  8.5 g  5  . triamcinolone cream (KENALOG) 0.1 % Apply topically 2 (two) times daily.  60 g  2  . fluticasone (FLONASE) 50 MCG/ACT nasal spray Place 2 sprays into the nose daily.  16 g  6  . zolpidem (AMBIEN) 10 MG tablet Take 1 tablet (10 mg total) by mouth at bedtime as needed for sleep.  30 tablet  2   No current facility-administered medications on file prior to visit.    BP 140/90  Pulse 68  Temp(Src) 98.3 F (36.8 C) (Oral)  Resp 20  Ht 6' 0.5" (1.842 m)  Wt 242 lb (109.77 kg)  BMI 32.35 kg/m2  SpO2 98%  Review of Systems  Constitutional: Negative for fever, chills, appetite change and fatigue.  HENT: Negative for congestion, dental problem, ear pain, hearing loss, sore throat, tinnitus, trouble swallowing and voice change.   Eyes: Negative for pain, discharge and visual disturbance.  Respiratory: Positive for cough and wheezing. Negative for chest tightness and stridor.   Cardiovascular: Negative for chest pain, palpitations and leg swelling.  Gastrointestinal: Negative for nausea, vomiting, abdominal pain, diarrhea, constipation, blood in stool and abdominal distention.  Genitourinary: Negative for urgency, hematuria, flank pain, discharge, difficulty urinating and genital sores.  Musculoskeletal: Negative for arthralgias, back pain, gait problem, joint swelling, myalgias and neck stiffness.  Skin: Negative for rash.  Neurological: Negative for dizziness, syncope, speech difficulty, weakness, numbness and headaches.  Hematological: Negative for adenopathy. Does not bruise/bleed easily.  Psychiatric/Behavioral: Negative for behavioral  problems and dysphoric mood. The patient is not nervous/anxious.        Objective:   Physical Exam  Constitutional: He appears well-developed and well-nourished. No distress.  Musculoskeletal:  Slight tenderness right anterior shoulder Full range of motion Provocative tests for rotator cuff pathology.  All normal  Slight tenderness involving the right lower arm soft tissues Pain  aggravated by flexion of the lower arm against resistance          Assessment & Plan:   Traumatic right shoulder pain Right lower arm tendinitis Chronic cough.  Secondary to ulnar sarcoidosis and aggravated by upper airway irritation secondary to postnasal drip.  We'll treat symptomatically

## 2014-01-06 ENCOUNTER — Other Ambulatory Visit: Payer: Self-pay | Admitting: Internal Medicine

## 2014-01-06 DIAGNOSIS — M25572 Pain in left ankle and joints of left foot: Secondary | ICD-10-CM

## 2014-01-13 ENCOUNTER — Other Ambulatory Visit: Payer: BC Managed Care – PPO

## 2014-03-19 ENCOUNTER — Telehealth: Payer: Self-pay | Admitting: Internal Medicine

## 2014-03-19 NOTE — Telephone Encounter (Signed)
stendra

## 2014-03-19 NOTE — Telephone Encounter (Signed)
Please advise 

## 2014-03-19 NOTE — Telephone Encounter (Signed)
Pt called in to req rx on Stendra or Sildenafil he said  which ever one Dr Burnice Logan think [facility] [place] [facility]

## 2014-03-22 MED ORDER — AVANAFIL 200 MG PO TABS: 0.5000 | ORAL_TABLET | ORAL | Status: DC | PRN

## 2014-03-22 NOTE — Telephone Encounter (Signed)
Dr. Raliegh Ip, what dosage of [place] do you want to order?

## 2014-03-22 NOTE — Telephone Encounter (Signed)
Left detailed message Rx sent to pharmacy, any questions please call office.

## 2014-03-22 NOTE — Telephone Encounter (Signed)
200 mg  1/2-1 tab a needed

## 2014-03-25 ENCOUNTER — Telehealth: Payer: Self-pay | Admitting: Internal Medicine

## 2014-03-25 ENCOUNTER — Ambulatory Visit
Admission: RE | Admit: 2014-03-25 | Discharge: 2014-03-25 | Disposition: A | Discharge status: Home / Self Care | Payer: BC Managed Care – PPO | Source: Ambulatory Visit | Attending: Internal Medicine | Admitting: Internal Medicine

## 2014-03-25 DIAGNOSIS — M25572 Pain in left ankle and joints of left foot: Secondary | ICD-10-CM

## 2014-03-25 MED ORDER — GADOBENATE DIMEGLUMINE 529 MG/ML IV SOLN
10.0000 mL | Freq: Once | INTRAVENOUS | Status: AC | PRN
  Administered 2014-03-25: 10 mL via INTRAVENOUS

## 2014-03-25 MED ORDER — AVANAFIL 200 MG PO TABS: 0.5000 | ORAL_TABLET | ORAL | Status: DC | PRN

## 2014-03-25 NOTE — Telephone Encounter (Signed)
New Rx sent to pharmacy

## 2014-03-25 NOTE — Telephone Encounter (Signed)
[employer] has APPROVED medication Stendra, however the QUANTITY LIMIT is 4 per 30 days.  Can you send a new RX with the quantity 4 per 30 days to [facility] on [address]?? I spoke to the pharmacy and they are going to fill the 4 with a $15 co-pay but would like the new RX to have on file for future re-fills.

## 2014-04-20 DIAGNOSIS — S93402A Sprain of unspecified ligament of left ankle, initial encounter: Secondary | ICD-10-CM | POA: Insufficient documentation

## 2014-04-20 DIAGNOSIS — M19072 Primary osteoarthritis, left ankle and foot: Secondary | ICD-10-CM | POA: Insufficient documentation

## 2014-04-20 DIAGNOSIS — M1990 Unspecified osteoarthritis, unspecified site: Secondary | ICD-10-CM | POA: Insufficient documentation

## 2014-07-26 ENCOUNTER — Encounter: Payer: Self-pay | Admitting: Internal Medicine

## 2014-07-26 ENCOUNTER — Ambulatory Visit (INDEPENDENT_AMBULATORY_CARE_PROVIDER_SITE_OTHER): Payer: BC Managed Care – PPO | Admitting: Internal Medicine

## 2014-07-26 ENCOUNTER — Telehealth: Payer: Self-pay | Admitting: Internal Medicine

## 2014-07-26 VITALS — BP 130/86 | HR 68 | Temp 97.8°F | Resp 20 | Ht 72.5 in | Wt 228.0 lb

## 2014-07-26 DIAGNOSIS — Z86010 Personal history of colonic polyps: Secondary | ICD-10-CM

## 2014-07-26 DIAGNOSIS — K5730 Diverticulosis of large intestine without perforation or abscess without bleeding: Secondary | ICD-10-CM

## 2014-07-26 MED ORDER — CIPROFLOXACIN HCL 500 MG PO TABS: 500.0000 mg | ORAL_TABLET | Freq: Two times a day (BID) | ORAL | Status: DC

## 2014-07-26 NOTE — Patient Instructions (Signed)
Call or return to clinic prn if these symptoms worsen or fail to improve as anticipated.  Diverticulitis Diverticulitis is inflammation or infection of small pouches in your colon that form when you have a condition called diverticulosis. The pouches in your colon are called diverticula. Your colon, or large intestine, is where water is absorbed and stool is formed. Complications of diverticulitis can include:  Bleeding.  Severe infection.  Severe pain.  Perforation of your colon.  Obstruction of your colon. CAUSES  Diverticulitis is caused by bacteria. Diverticulitis happens when stool becomes trapped in diverticula. This allows bacteria to grow in the diverticula, which can lead to inflammation and infection. RISK FACTORS People with diverticulosis are at risk for diverticulitis. Eating a diet that does not include enough fiber from fruits and vegetables may make diverticulitis more likely to develop. SYMPTOMS  Symptoms of diverticulitis may include:  Abdominal pain and tenderness. The pain is normally located on the left side of the abdomen, but may occur in other areas.  Fever and chills.  Bloating.  Cramping.  Nausea.  Vomiting.  Constipation.  Diarrhea.  Blood in your stool. DIAGNOSIS  Your health care provider will ask you about your medical history and do a physical exam. You may need to have tests done because many medical conditions can cause the same symptoms as diverticulitis. Tests may include:  Blood tests.  Urine tests.  Imaging tests of the abdomen, including X-rays and CT scans. When your condition is under control, your health care provider may recommend that you have a colonoscopy. A colonoscopy can show how severe your diverticula are and whether something else is causing your symptoms. TREATMENT  Most cases of diverticulitis are mild and can be treated at home. Treatment may include:  Taking over-the-counter pain medicines.  Following a clear  liquid diet.  Taking antibiotic medicines by mouth for 7-10 days. More severe cases may be treated at a hospital. Treatment may include:  Not eating or drinking.  Taking prescription pain medicine.  Receiving antibiotic medicines through an IV tube.  Receiving fluids and nutrition through an IV tube.  Surgery. HOME CARE INSTRUCTIONS   Follow your health care provider's instructions carefully.  Follow a full liquid diet or other diet as directed by your health care provider. After your symptoms improve, your health care provider may tell you to change your diet. He or she may recommend you eat a high-fiber diet. Fruits and vegetables are good sources of fiber. Fiber makes it easier to pass stool.  Take fiber supplements or probiotics as directed by your health care provider.  Only take medicines as directed by your health care provider.  Keep all your follow-up appointments. SEEK MEDICAL CARE IF:   Your pain does not improve.  You have a hard time eating food.  Your bowel movements do not return to normal. SEEK IMMEDIATE MEDICAL CARE IF:   Your pain becomes worse.  Your symptoms do not get better.  Your symptoms suddenly get worse.  You have a fever.  You have repeated vomiting.  You have bloody or black, tarry stools. MAKE SURE YOU:   Understand these instructions.  Will watch your condition.  Will get help right away if you are not doing well or get worse. Document Released: 07/18/2005 Document Revised: 10/13/2013 Document Reviewed: 09/02/2013 [employer] Patient Information 2015 [employer]. This information is not intended to replace advice given to you by your health care provider. Make sure you discuss any questions you have with your   health care provider.  

## 2014-07-26 NOTE — Telephone Encounter (Signed)
Patient Information:  Caller Name: Jason Weaver  Phone: [phone]  Patient: Jason, Weaver  Gender: Male  DOB: 1956-09-20  Age: 58 Years  PCP: Bluford Kaufmann (Family Practice > 2yrs old)  Office Follow Up:  Does the office need to follow up with this patient?: No  Instructions For The Office: N/A   Symptoms  Reason For Call & Symptoms: Pt reports he has a flare up of diverticulitis and requesting appt today.  Reviewed Health History In EMR: Yes  Reviewed Medications In EMR: Yes  Reviewed Allergies In EMR: Yes  Reviewed Surgeries / Procedures: Yes  Date of Onset of Symptoms: 07/23/2014  Guideline(s) Used:  No Protocol Available - Sick Adult  Disposition Per Guideline:   See Today in Office  Reason For Disposition Reached:   Patient wants to be seen  Advice Given:  N/A  Patient Will Follow Care Advice:  YES  Appointment Scheduled:  07/26/2014 16:15:00 Appointment Scheduled Provider:  Bluford Kaufmann (Family Practice > 47yrs old)

## 2014-07-26 NOTE — Progress Notes (Signed)
Pre visit review using our clinic review tool, if applicable. No additional management support is needed unless otherwise documented below in the visit note. 

## 2014-07-26 NOTE — Progress Notes (Signed)
Subjective:    Patient ID: Jason Weaver, male    DOB: January 02, 1956, 58 y.o.   MRN: [id number]  HPI  58 year old patient who has a history of known diverticulosis.  Last year, he presented with left lower quadrant pain and fever and initially presented to the ED.  He left without being seen and was evaluated at this office later.  At that time, he was symptomatically improved and exam was unremarkable.  He was observed off antibiotics and continued to do well.  For the past 2-3 days.  Patient has had some abdominal discomfort associated with bloating.  He had some earlier diarrhea, which has resolved.  No nausea or vomiting.  He feels that he has improved over the past 24 hours  Past Medical History  Diagnosis Date  . Hyperlipidemia   . ED (erectile dysfunction)   . Gout   . Diverticulosis of colon   . Hx of colonic polyps   . Nasal septal deviation   . Obesity   . Hearing loss   . 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 Marital Status: Married    Spouse Name: N/A    Number of Children: N/A  . Years of Education: N/A   Occupational History  . Government social research officer    Social History Main Topics  . Smoking status: Never Smoker   . Smokeless tobacco: Never Used  . Alcohol Use: Yes  . Drug Use: Not on file  . Sexual Activity: Not on file   Other Topics Concern  . Not on file   Social History Narrative  . No narrative on file    Past Surgical History  Procedure Laterality Date  . Hernia repair  2009  . Tonsillectomy  1979  . Cataract extraction    . Lung biopsy  1997    Family History  Problem Relation Age of Onset  . COPD Father     was a smoker  . Arthritis Father   . Asthma Sister   . Cancer Sister     No Known Allergies  Current Outpatient Prescriptions on File Prior to Visit  Medication Sig Dispense Refill  . Ascorbic Acid (VITAMIN C) 500 MG tablet Take 500 mg by mouth daily.        [place] PROAIR HFA 108 (90 BASE) MCG/ACT  inhaler INHALE 2 PUFFS INTO THE LUNGS EVERY 6 (SIX) HOURS AS NEEDED FOR WHEEZING.  8.5 g  5  . allopurinol (ZYLOPRIM) 300 MG tablet Take 1 tablet (300 mg total) by mouth daily as needed.  90 tablet  6  . Avanafil 200 MG TABS Take 0.5-1 tablets by mouth as needed.  4 tablet  2  . fluticasone (FLONASE) 50 MCG/ACT nasal spray Place 2 sprays into both nostrils daily.  16 g  6  . hydrocortisone (ANUSOL-HC) 2.5 % rectal cream Place rectally 2 (two) times daily.  30 g  3  . ketoconazole (NIZORAL) 2 % cream Apply topically 2 (two) times daily. As directed  15 g  3  . nabumetone (RELAFEN) 500 MG tablet Take 1 tablet (500 mg total) by mouth 2 (two) times daily. 2 tabs bid as needed  90 tablet  1  . triamcinolone cream (KENALOG) 0.1 % Apply topically 2 (two) times daily.  60 g  2  . zolpidem (AMBIEN) 10 MG tablet Take 1 tablet (10 mg total) by mouth at bedtime as needed for sleep.  30 tablet  2   No current facility-administered medications on file prior to visit.    BP 130/86  Pulse 68  Temp(Src) 97.8 F (36.6 C) (Oral)  Resp 20  Ht 6' 0.5" (1.842 m)  Wt 228 lb (103.42 kg)  BMI 30.48 kg/m2  SpO2 99%      Review of Systems  Constitutional: Negative for fever, chills, appetite change and fatigue.  HENT: Negative for congestion, dental problem, ear pain, hearing loss, sore throat, tinnitus, trouble swallowing and voice change.   Eyes: Negative for pain, discharge and visual disturbance.  Respiratory: Negative for cough, chest tightness, wheezing and stridor.   Cardiovascular: Negative for chest pain, palpitations and leg swelling.  Gastrointestinal: Positive for abdominal pain. Negative for nausea, vomiting, diarrhea, constipation, blood in stool and abdominal distention.  Genitourinary: Negative for urgency, hematuria, flank pain, discharge, difficulty urinating and genital sores.  Musculoskeletal: Negative for arthralgias, back pain, gait problem, joint swelling, myalgias and neck stiffness.    Skin: Negative for rash.  Neurological: Negative for dizziness, syncope, speech difficulty, weakness, numbness and headaches.  Hematological: Negative for adenopathy. Does not bruise/bleed easily.  Psychiatric/Behavioral: Negative for behavioral problems and dysphoric mood. The patient is not nervous/anxious.        Objective:   Physical Exam  Constitutional: He is oriented to person, place, and time. He appears well-developed.  HENT:  Head: Normocephalic.  Right Ear: External ear normal.  Left Ear: External ear normal.  Eyes: Conjunctivae and EOM are normal.  Neck: Normal range of motion.  Cardiovascular: Normal rate and normal heart sounds.   Pulmonary/Chest: Breath sounds normal.  Abdominal: Soft. Bowel sounds are normal. He exhibits no distension. There is tenderness. There is rebound. There is no guarding.  Left lower quadrant tenderness with mild rebound.  No guarding; bowel sounds present  Musculoskeletal: Normal range of motion. He exhibits no edema and no tenderness.  Neurological: He is alert and oriented to person, place, and time.  Psychiatric: He has a normal mood and affect. His behavior is normal.          Assessment & Plan:   Suspect flare of acute diverticulitis.  Patient is improving, but still has some tenderness on clinical exam.  He has no fever or tachycardia.  We'll treat with Cipro for 7 days.  If he has any symptoms in 48 hours has been asked to call the office for a second antibiotic  We'll call if unimproved

## 2014-07-26 NOTE — Telephone Encounter (Signed)
Noted  

## 2014-10-28 ENCOUNTER — Other Ambulatory Visit: Payer: BC Managed Care – PPO

## 2014-10-29 ENCOUNTER — Other Ambulatory Visit (INDEPENDENT_AMBULATORY_CARE_PROVIDER_SITE_OTHER): Payer: BLUE CROSS/BLUE SHIELD

## 2014-10-29 DIAGNOSIS — Z0000 Encounter for general adult medical examination without abnormal findings: Secondary | ICD-10-CM

## 2014-10-29 DIAGNOSIS — E785 Hyperlipidemia, unspecified: Secondary | ICD-10-CM

## 2014-10-29 LAB — POCT URINALYSIS DIPSTICK
Bilirubin, UA: NEGATIVE
Blood, UA: NEGATIVE
GLUCOSE UA: NEGATIVE
KETONES UA: NEGATIVE
Leukocytes, UA: NEGATIVE
Nitrite, UA: NEGATIVE
PH UA: 6
Protein, UA: NEGATIVE
Spec Grav, UA: 1.015
Urobilinogen, UA: 0.2

## 2014-10-29 LAB — HEPATIC FUNCTION PANEL
ALT: 33 U/L (ref 0–53)
AST: 22 U/L (ref 0–37)
Albumin: 4.5 g/dL (ref 3.5–5.2)
Alkaline Phosphatase: 77 U/L (ref 39–117)
Bilirubin, Direct: 0.1 mg/dL (ref 0.0–0.3)
Total Bilirubin: 0.8 mg/dL (ref 0.2–1.2)
Total Protein: 7.6 g/dL (ref 6.0–8.3)

## 2014-10-29 LAB — BASIC METABOLIC PANEL WITH GFR
BUN: 19 mg/dL (ref 6–23)
CO2: 29 meq/L (ref 19–32)
Calcium: 10.2 mg/dL (ref 8.4–10.5)
Chloride: 103 meq/L (ref 96–112)
Creatinine, Ser: 1.2 mg/dL (ref 0.4–1.5)
GFR: 64.53 mL/min
Glucose, Bld: 99 mg/dL (ref 70–99)
Potassium: 5.2 meq/L — ABNORMAL HIGH (ref 3.5–5.1)
Sodium: 139 meq/L (ref 135–145)

## 2014-10-29 LAB — CBC WITH DIFFERENTIAL/PLATELET
BASOS ABS: 0 10*3/uL (ref 0.0–0.1)
Basophils Relative: 0.7 % (ref 0.0–3.0)
Eosinophils Absolute: 0.5 10*3/uL (ref 0.0–0.7)
Eosinophils Relative: 6.8 % — ABNORMAL HIGH (ref 0.0–5.0)
HCT: 49.6 % (ref 39.0–52.0)
HEMOGLOBIN: 17.1 g/dL — AB (ref 13.0–17.0)
LYMPHS ABS: 0.9 10*3/uL (ref 0.7–4.0)
LYMPHS PCT: 13 % (ref 12.0–46.0)
MCHC: 34.5 g/dL (ref 30.0–36.0)
MCV: 90.5 fl (ref 78.0–100.0)
Monocytes Absolute: 0.8 10*3/uL (ref 0.1–1.0)
Monocytes Relative: 11.4 % (ref 3.0–12.0)
Neutro Abs: 4.8 10*3/uL (ref 1.4–7.7)
Neutrophils Relative %: 68.1 % (ref 43.0–77.0)
PLATELETS: 249 10*3/uL (ref 150.0–400.0)
RBC: 5.48 Mil/uL (ref 4.22–5.81)
RDW: 13.6 % (ref 11.5–15.5)
WBC: 7 10*3/uL (ref 4.0–10.5)

## 2014-10-29 LAB — LIPID PANEL
CHOL/HDL RATIO: 6
CHOLESTEROL: 296 mg/dL — AB (ref 0–200)
HDL: 47.7 mg/dL (ref >39.00)
NONHDL: 248.3
Triglycerides: 297 mg/dL — ABNORMAL HIGH (ref 0.0–149.0)
VLDL: 59.4 mg/dL — AB (ref 0.0–40.0)

## 2014-10-29 LAB — LDL CHOLESTEROL, DIRECT: LDL DIRECT: 183 mg/dL

## 2014-10-29 LAB — PSA: PSA: 1.48 ng/mL (ref 0.10–4.00)

## 2014-10-29 LAB — TSH: TSH: 2.41 u[IU]/mL (ref 0.35–4.50)

## 2014-11-02 ENCOUNTER — Ambulatory Visit (INDEPENDENT_AMBULATORY_CARE_PROVIDER_SITE_OTHER): Payer: BLUE CROSS/BLUE SHIELD | Admitting: Internal Medicine

## 2014-11-02 ENCOUNTER — Encounter: Payer: Self-pay | Admitting: Internal Medicine

## 2014-11-02 VITALS — BP 136/90 | HR 62 | Temp 97.6°F | Resp 20 | Ht 71.5 in | Wt 237.0 lb

## 2014-11-02 DIAGNOSIS — D860 Sarcoidosis of lung: Secondary | ICD-10-CM

## 2014-11-02 DIAGNOSIS — Z0000 Encounter for general adult medical examination without abnormal findings: Secondary | ICD-10-CM

## 2014-11-02 DIAGNOSIS — Z86010 Personal history of colonic polyps: Secondary | ICD-10-CM

## 2014-11-02 DIAGNOSIS — G4733 Obstructive sleep apnea (adult) (pediatric): Secondary | ICD-10-CM | POA: Diagnosis not present

## 2014-11-02 DIAGNOSIS — E785 Hyperlipidemia, unspecified: Secondary | ICD-10-CM | POA: Diagnosis not present

## 2014-11-02 NOTE — Progress Notes (Signed)
Pre visit review using our clinic review tool, if applicable. No additional management support is needed unless otherwise documented below in the visit note. 

## 2014-11-02 NOTE — Progress Notes (Signed)
Subjective:    Patient ID: Jason Weaver, male    DOB: 1956/05/05, 59 y.o.   MRN: [id number]  HPI 59 -year-old patient who is seen today for an annual exam.  He is followed by pulmonary medicine do to  pulmonary sarcoid as well as obstructive sleep apnea. He is doing reasonably well.  He has a history of dyslipidemia with a high HDL. He has been intolerant of at least two statins in the past with myalgias with an earlier generic and then an episode of angioedema with Crestor.  He also has concerns that Crestor caused acute left-sided hearing loss He has a history of gout and hyperuricemia which has been controlled on allopurinol. He has a history of vestibular dysfunction and left-sided hearing loss.  Family history. Father age 57 has heart failure and coronary artery disease status post stenting. Mother is 32 his unremarkably good health. One brother has some alcohol issues but otherwise healthy. One sister has a history of breast cancer in remission  Past Medical History  Diagnosis Date  . Hyperlipidemia   . ED (erectile dysfunction)   . Gout   . Diverticulosis of colon   . Hx of colonic polyps   . Nasal septal deviation   . Obesity   . Hearing loss   . 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 Marital Status: Married    Spouse Name: N/A    Number of Children: N/A  . Years of Education: N/A   Occupational History  . Government social research officer    Social History Main Topics  . Smoking status: Never Smoker   . Smokeless tobacco: Never Used  . Alcohol Use: Yes  . Drug Use: Not on file  . Sexual Activity: Not on file   Other Topics Concern  . Not on file   Social History Narrative    Past Surgical History  Procedure Laterality Date  . Hernia repair  2009  . Tonsillectomy  1979  . Cataract extraction    . Lung biopsy  1997    Family History  Problem Relation Age of Onset  . COPD Father     was a smoker  . Arthritis Father   .  Asthma Sister   . Cancer Sister     No Known Allergies  Current Outpatient Prescriptions on File Prior to Visit  Medication Sig Dispense Refill  . Ascorbic Acid (VITAMIN C) 500 MG tablet Take 500 mg by mouth daily.      . Avanafil 200 MG TABS Take 0.5-1 tablets by mouth as needed. 4 tablet 2  . fluticasone (FLONASE) 50 MCG/ACT nasal spray Place 2 sprays into both nostrils daily. 16 g 6  . hydrocortisone (ANUSOL-HC) 2.5 % rectal cream Place rectally 2 (two) times daily. 30 g 3  . PROAIR HFA 108 (90 BASE) MCG/ACT inhaler INHALE 2 PUFFS INTO THE LUNGS EVERY 6 (SIX) HOURS AS NEEDED FOR WHEEZING. 8.5 g 5  . zolpidem (AMBIEN) 10 MG tablet Take 1 tablet (10 mg total) by mouth at bedtime as needed for sleep. 30 tablet 2   No current facility-administered medications on file prior to visit.    BP 136/90 mmHg  Pulse 62  Temp(Src) 97.6 F (36.4 C) (Oral)  Resp 20  Ht 5' 11.5" (1.816 m)  Wt 237 lb (107.502 kg)  BMI 32.60 kg/m2  SpO2 97%       Review of Systems  Constitutional: Negative  for fever, chills, activity change, appetite change and fatigue.  HENT: Positive for hearing loss. Negative for congestion, dental problem, ear pain, mouth sores, rhinorrhea, sinus pressure, sneezing, tinnitus, trouble swallowing and voice change.   Eyes: Negative for photophobia, pain, redness and visual disturbance.  Respiratory: Negative for apnea, cough, choking, chest tightness, shortness of breath and wheezing.   Cardiovascular: Negative for chest pain, palpitations and leg swelling.  Gastrointestinal: Negative for nausea, vomiting, abdominal pain, diarrhea, constipation, blood in stool, abdominal distention, anal bleeding and rectal pain.  Genitourinary: Negative for dysuria, urgency, frequency, hematuria, flank pain, decreased urine volume, discharge, penile swelling, scrotal swelling, difficulty urinating, genital sores and testicular pain.  Musculoskeletal: Positive for gait problem. Negative for  myalgias, back pain, joint swelling, arthralgias, neck pain and neck stiffness.  Skin: Negative for color change, rash and wound.  Neurological: Negative for dizziness, tremors, seizures, syncope, facial asymmetry, speech difficulty, weakness, light-headedness, numbness and headaches.  Hematological: Negative for adenopathy. Does not bruise/bleed easily.  Psychiatric/Behavioral: Negative for suicidal ideas, hallucinations, behavioral problems, confusion, sleep disturbance, self-injury, dysphoric mood, decreased concentration and agitation. The patient is not nervous/anxious.        Objective:   Physical Exam  Constitutional: He appears well-developed and well-nourished.  Repeat blood pressure 130/82  HENT:  Head: Normocephalic and atraumatic.  Right Ear: External ear normal.  Left Ear: External ear normal.  Nose: Nose normal.  Mouth/Throat: Oropharynx is clear and moist.  Nasal deviation  Eyes: Conjunctivae and EOM are normal. Pupils are equal, round, and reactive to light. No scleral icterus.  Neck: Normal range of motion. Neck supple. No JVD present. No thyromegaly present.  Cardiovascular: Regular rhythm, normal heart sounds and intact distal pulses.  Exam reveals no gallop and no friction rub.   No murmur heard. Pulmonary/Chest: Effort normal and breath sounds normal. He exhibits no tenderness.  Abdominal: Soft. Bowel sounds are normal. He exhibits no distension and no mass. There is no tenderness.  Genitourinary: Prostate normal and penis normal.  Musculoskeletal: Normal range of motion. He exhibits no edema or tenderness.  Lymphadenopathy:    He has no cervical adenopathy.  Neurological: He is alert. He has normal reflexes. No cranial nerve deficit. Coordination normal.  Skin: Skin is warm and dry. No rash noted.  Psychiatric: He has a normal mood and affect. His behavior is normal.          Assessment & Plan:   Preventive health exam Colonic polyps. Will need followup  colonoscopy- scheduled with Dr. Oletta Lamas later this year Dyslipidemia.  We'll continue heart healthy diet and efforts at weight loss and exercise Pulmonary sarcoid. Pulmonary followup in September

## 2014-11-02 NOTE — Patient Instructions (Signed)
Fat and Cholesterol Control Diet Fat and cholesterol levels in your blood and organs are influenced by your diet. High levels of fat and cholesterol may lead to diseases of the heart, small and large blood vessels, gallbladder, liver, and pancreas. CONTROLLING FAT AND CHOLESTEROL WITH DIET Although exercise and lifestyle factors are important, your diet is key. That is because certain foods are known to raise cholesterol and others to lower it. The goal is to balance foods for their effect on cholesterol and more importantly, to replace saturated and trans fat with other types of fat, such as monounsaturated fat, polyunsaturated fat, and omega-3 fatty acids. On average, a person should consume no more than 15 to 17 g of saturated fat daily. Saturated and trans fats are considered "bad" fats, and they will raise LDL cholesterol. Saturated fats are primarily found in animal products such as meats, butter, and cream. However, that does not mean you need to give up all your favorite foods. Today, there are good tasting, low-fat, low-cholesterol substitutes for most of the things you like to eat. Choose low-fat or nonfat alternatives. Choose round or loin cuts of red meat. These types of cuts are lowest in fat and cholesterol. Chicken (without the skin), fish, veal, and ground turkey breast are great choices. Eliminate fatty meats, such as hot dogs and salami. Even shellfish have little or no saturated fat. Have a 3 oz (85 g) portion when you eat lean meat, poultry, or fish. Trans fats are also called "partially hydrogenated oils." They are oils that have been scientifically manipulated so that they are solid at room temperature resulting in a longer shelf life and improved taste and texture of foods in which they are added. Trans fats are found in stick margarine, some tub margarines, cookies, crackers, and baked goods.  When baking and cooking, oils are a great substitute for butter. The monounsaturated oils are  especially beneficial since it is believed they lower LDL and raise HDL. The oils you should avoid entirely are saturated tropical oils, such as coconut and palm.  Remember to eat a lot from food groups that are naturally free of saturated and trans fat, including fish, fruit, vegetables, beans, grains (barley, rice, couscous, bulgur wheat), and pasta (without cream sauces).  IDENTIFYING FOODS THAT LOWER FAT AND CHOLESTEROL  Soluble fiber may lower your cholesterol. This type of fiber is found in fruits such as apples, vegetables such as broccoli, potatoes, and carrots, legumes such as beans, peas, and lentils, and grains such as barley. Foods fortified with plant sterols (phytosterol) may also lower cholesterol. You should eat at least 2 g per day of these foods for a cholesterol lowering effect.  Read package labels to identify low-saturated fats, trans fat free, and low-fat foods at the supermarket. Select cheeses that have only 2 to 3 g saturated fat per ounce. Use a heart-healthy tub margarine that is free of trans fats or partially hydrogenated oil. When buying baked goods (cookies, crackers), avoid partially hydrogenated oils. Breads and muffins should be made from whole grains (whole-wheat or whole oat flour, instead of "flour" or "enriched flour"). Buy non-creamy canned soups with reduced salt and no added fats.  FOOD PREPARATION TECHNIQUES  Never deep-fry. If you must fry, either stir-fry, which uses very little fat, or use non-stick cooking sprays. When possible, broil, bake, or roast meats, and steam vegetables. Instead of putting butter or margarine on vegetables, use lemon and herbs, applesauce, and cinnamon (for squash and sweet potatoes). Use nonfat   yogurt, salsa, and low-fat dressings for salads.  LOW-SATURATED FAT / LOW-FAT FOOD SUBSTITUTES Meats / Saturated Fat (g)  Avoid: Steak, marbled (3 oz/85 g) / 11 g  Choose: Steak, lean (3 oz/85 g) / 4 g  Avoid: Hamburger (3 oz/85 g) / 7  g  Choose: Hamburger, lean (3 oz/85 g) / 5 g  Avoid: Ham (3 oz/85 g) / 6 g  Choose: Ham, lean cut (3 oz/85 g) / 2.4 g  Avoid: Chicken, with skin, dark meat (3 oz/85 g) / 4 g  Choose: Chicken, skin removed, dark meat (3 oz/85 g) / 2 g  Avoid: Chicken, with skin, light meat (3 oz/85 g) / 2.5 g  Choose: Chicken, skin removed, light meat (3 oz/85 g) / 1 g Dairy / Saturated Fat (g)  Avoid: Whole milk (1 cup) / 5 g  Choose: Low-fat milk, 2% (1 cup) / 3 g  Choose: Low-fat milk, 1% (1 cup) / 1.5 g  Choose: Skim milk (1 cup) / 0.3 g  Avoid: Hard cheese (1 oz/28 g) / 6 g  Choose: Skim milk cheese (1 oz/28 g) / 2 to 3 g  Avoid: Cottage cheese, 4% fat (1 cup) / 6.5 g  Choose: Low-fat cottage cheese, 1% fat (1 cup) / 1.5 g  Avoid: Ice cream (1 cup) / 9 g  Choose: Sherbet (1 cup) / 2.5 g  Choose: Nonfat frozen yogurt (1 cup) / 0.3 g  Choose: Frozen fruit bar / trace  Avoid: Whipped cream (1 tbs) / 3.5 g  Choose: Nondairy whipped topping (1 tbs) / 1 g Condiments / Saturated Fat (g)  Avoid: Mayonnaise (1 tbs) / 2 g  Choose: Low-fat mayonnaise (1 tbs) / 1 g  Avoid: Butter (1 tbs) / 7 g  Choose: Extra light margarine (1 tbs) / 1 g  Avoid: Coconut oil (1 tbs) / 11.8 g  Choose: Olive oil (1 tbs) / 1.8 g  Choose: Corn oil (1 tbs) / 1.7 g  Choose: Safflower oil (1 tbs) / 1.2 g  Choose: Sunflower oil (1 tbs) / 1.4 g  Choose: Soybean oil (1 tbs) / 2.4 g  Choose: Canola oil (1 tbs) / 1 g Document Released: 10/08/2005 Document Revised: 02/02/2013 Document Reviewed: 01/06/2014 [employer] Patient Information 2015 [place], [place]. This information is not intended to replace advice given to you by your health care provider. Make sure you discuss any questions you have with your health care provider. Health Maintenance A healthy lifestyle and preventative care can promote health and wellness.  Maintain regular health, dental, and eye exams.  Eat a healthy diet. Foods like  vegetables, fruits, whole grains, low-fat dairy products, and lean protein foods contain the nutrients you need and are low in calories. Decrease your intake of foods high in solid fats, added sugars, and salt. Get information about a proper diet from your health care provider, if necessary.  Regular physical exercise is one of the most important things you can do for your health. Most adults should get at least 150 minutes of moderate-intensity exercise (any activity that increases your heart rate and causes you to sweat) each week. In addition, most adults need muscle-strengthening exercises on 2 or more days a week.   Maintain a healthy weight. The body mass index (BMI) is a screening tool to identify possible weight problems. It provides an estimate of body fat based on height and weight. Your health care provider can find your BMI and can help you achieve or maintain a  healthy weight. For males 20 years and older:  A BMI below 18.5 is considered underweight.  A BMI of 18.5 to 24.9 is normal.  A BMI of 25 to 29.9 is considered overweight.  A BMI of 30 and above is considered obese.  Maintain normal blood lipids and cholesterol by exercising and minimizing your intake of saturated fat. Eat a balanced diet with plenty of fruits and vegetables. Blood tests for lipids and cholesterol should begin at age 25 and be repeated every 5 years. If your lipid or cholesterol levels are high, you are over age 3, or you are at high risk for heart disease, you may need your cholesterol levels checked more frequently.Ongoing high lipid and cholesterol levels should be treated with medicines if diet and exercise are not working.  If you smoke, find out from your health care provider how to quit. If you do not use tobacco, do not start.  Lung cancer screening is recommended for adults aged 32-80 years who are at high risk for developing lung cancer because of a history of smoking. A yearly low-dose CT scan of  the lungs is recommended for people who have at least a 30-pack-year history of smoking and are current smokers or have quit within the past 15 years. A pack year of smoking is smoking an average of 1 pack of cigarettes a day for 1 year (for example, a 30-pack-year history of smoking could mean smoking 1 pack a day for 30 years or 2 packs a day for 15 years). Yearly screening should continue until the smoker has stopped smoking for at least 15 years. Yearly screening should be stopped for people who develop a health problem that would prevent them from having lung cancer treatment.  If you choose to drink alcohol, do not have more than 2 drinks per day. One drink is considered to be 12 oz (360 mL) of beer, 5 oz (150 mL) of wine, or 1.5 oz (45 mL) of liquor.  Avoid the use of street drugs. Do not share needles with anyone. Ask for help if you need support or instructions about stopping the use of drugs.  High blood pressure causes heart disease and increases the risk of stroke. Blood pressure should be checked at least every 1-2 years. Ongoing high blood pressure should be treated with medicines if weight loss and exercise are not effective.  If you are 14-58 years old, ask your health care provider if you should take aspirin to prevent heart disease.  Diabetes screening involves taking a blood sample to check your fasting blood sugar level. This should be done once every 3 years after age 7 if you are at a normal weight and without risk factors for diabetes. Testing should be considered at a younger age or be carried out more frequently if you are overweight and have at least 1 risk factor for diabetes.  Colorectal cancer can be detected and often prevented. Most routine colorectal cancer screening begins at the age of 62 and continues through age 40. However, your health care provider may recommend screening at an earlier age if you have risk factors for colon cancer. On a yearly basis, your health  care provider may provide home test kits to check for hidden blood in the stool. A small camera at the end of a tube may be used to directly examine the colon (sigmoidoscopy or colonoscopy) to detect the earliest forms of colorectal cancer. Talk to your health care provider about this at age  50 when routine screening begins. A direct exam of the colon should be repeated every 5-10 years through age 34, unless early forms of precancerous polyps or small growths are found.  People who are at an increased risk for hepatitis B should be screened for this virus. You are considered at high risk for hepatitis B if:  You were born in a country where hepatitis B occurs often. Talk with your health care provider about which countries are considered high risk.  Your parents were born in a high-risk country and you have not received a shot to protect against hepatitis B (hepatitis B vaccine).  You have HIV or AIDS.  You use needles to inject street drugs.  You live with, or have sex with, someone who has hepatitis B.  You are a man who has sex with other men (MSM).  You get hemodialysis treatment.  You take certain medicines for conditions like cancer, organ transplantation, and autoimmune conditions.  Hepatitis C blood testing is recommended for all people born from [birth year] through 1963 and any individual with known risk factors for hepatitis C.  Healthy men should no longer receive prostate-specific antigen (PSA) blood tests as part of routine cancer screening. Talk to your health care provider about prostate cancer screening.  Testicular cancer screening is not recommended for adolescents or adult males who have no symptoms. Screening includes self-exam, a health care provider exam, and other screening tests. Consult with your health care provider about any symptoms you have or any concerns you have about testicular cancer.  Practice safe sex. Use condoms and avoid high-risk sexual practices to reduce  the spread of sexually transmitted infections (STIs).  You should be screened for STIs, including gonorrhea and chlamydia if:  You are sexually active and are younger than 24 years.  You are older than 24 years, and your health care provider tells you that you are at risk for this type of infection.  Your sexual activity has changed since you were last screened, and you are at an increased risk for chlamydia or gonorrhea. Ask your health care provider if you are at risk.  If you are at risk of being infected with HIV, it is recommended that you take a prescription medicine daily to prevent HIV infection. This is called pre-exposure prophylaxis (PrEP). You are considered at risk if:  You are a man who has sex with other men (MSM).  You are a heterosexual man who is sexually active with multiple partners.  You take drugs by injection.  You are sexually active with a partner who has HIV.  Talk with your health care provider about whether you are at high risk of being infected with HIV. If you choose to begin PrEP, you should first be tested for HIV. You should then be tested every 3 months for as long as you are taking PrEP.  Use sunscreen. Apply sunscreen liberally and repeatedly throughout the day. You should seek shade when your shadow is shorter than you. Protect yourself by wearing long sleeves, pants, a wide-brimmed hat, and sunglasses year round whenever you are outdoors.  Tell your health care provider of new moles or changes in moles, especially if there is a change in shape or color. Also, tell your health care provider if a mole is larger than the size of a pencil eraser.  A one-time screening for abdominal aortic aneurysm (AAA) and surgical repair of large AAAs by ultrasound is recommended for men aged 23-75 years who are current  or former smokers.  Stay current with your vaccines (immunizations). Document Released: 04/05/2008 Document Revised: 10/13/2013 Document Reviewed:  03/05/2011 [facility] Patient Information 2015 [place], [place]. This information is not intended to replace advice given to you by your health care provider. Make sure you discuss any questions you have with your health care provider.

## 2014-11-10 ENCOUNTER — Encounter: Payer: Self-pay | Admitting: Internal Medicine

## 2014-11-10 ENCOUNTER — Telehealth: Payer: Self-pay | Admitting: Internal Medicine

## 2014-11-10 ENCOUNTER — Ambulatory Visit (INDEPENDENT_AMBULATORY_CARE_PROVIDER_SITE_OTHER): Payer: BLUE CROSS/BLUE SHIELD | Admitting: Internal Medicine

## 2014-11-10 DIAGNOSIS — R079 Chest pain, unspecified: Secondary | ICD-10-CM

## 2014-11-10 MED ORDER — AVANAFIL 200 MG PO TABS: 0.5000 | ORAL_TABLET | ORAL | Status: DC | PRN

## 2014-11-10 MED ORDER — ZOLPIDEM TARTRATE 10 MG PO TABS: 10.0000 mg | ORAL_TABLET | Freq: Every evening | ORAL | Status: DC | PRN

## 2014-11-10 NOTE — Patient Instructions (Signed)
Acute bronchitis symptoms for less than 10 days are generally not helped by antibiotics.  Take over-the-counter expectorants and cough medications such as  Mucinex DM.  Call if there is no improvement in 5 to 7 days or if  you develop worsening cough, fever, or new symptoms, such as shortness of breath or chest pain.  Call or return to clinic prn if these symptoms worsen or fail to improve as anticipated.

## 2014-11-10 NOTE — Telephone Encounter (Signed)
[facility] - Client [place] [facility] Patient Name: Jason Weaver DOB: Mar 14, 1956 Initial Comment Caller states c/o chest pain x 2 days Nurse Assessment Nurse: Rock Nephew, RN, Juliann Pulse Date/Time (Eastern Time): 11/10/2014 9:51:58 AM Confirm and document reason for call. If symptomatic, describe symptoms. ---Caller states that he has chest pain that has been present for 2 days. Has the patient traveled out of the country within the last 30 days? ---Not Applicable Does the patient require triage? ---Yes Related visit to physician within the last 2 weeks? ---No Does the PT have any chronic conditions? (i.e. diabetes, asthma, etc.) ---No Guidelines Guideline Title Affirmed Question Affirmed Notes Chest Pain [1] Intermittent chest pain or "angina" AND [2] increasing in severity or frequency (Exception: pains lasting a few seconds) Final Disposition User Go to ED Now Rock Nephew, RN, Juliann Pulse

## 2014-11-10 NOTE — Telephone Encounter (Signed)
Pt seen today by Dr.K.

## 2014-11-10 NOTE — Telephone Encounter (Signed)
FYI

## 2014-11-10 NOTE — Progress Notes (Signed)
Pre visit review using our clinic review tool, if applicable. No additional management support is needed unless otherwise documented below in the visit note. 

## 2014-11-10 NOTE — Progress Notes (Signed)
Subjective:    Patient ID: Jason Weaver, male    DOB: 1956/10/19, 59 y.o.   MRN: [id number]  HPI 59 year old patient who presents with a chief complaint of left anterior chest wall pain.  He states that over the past few days he has had a mild URI with frequent paroxysms of coughing.  4 days ago.  He also had a colonoscopy.  Pain is located in the left anterior chest wall area and aggravated by deep inspiration as well as local pressure.  No shortness of breath No fever or productive cough  Past Medical History  Diagnosis Date  . Hyperlipidemia   . ED (erectile dysfunction)   . Gout   . Diverticulosis of colon   . Hx of colonic polyps   . Nasal septal deviation   . Obesity   . Hearing loss   . 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 Marital Status: Married    Spouse Name: N/A    Number of Children: N/A  . Years of Education: N/A   Occupational History  . Government social research officer    Social History Main Topics  . Smoking status: Never Smoker   . Smokeless tobacco: Never Used  . Alcohol Use: Yes  . Drug Use: Not on file  . Sexual Activity: Not on file   Other Topics Concern  . Not on file   Social History Narrative    Past Surgical History  Procedure Laterality Date  . Hernia repair  2009  . Tonsillectomy  1979  . Cataract extraction    . Lung biopsy  1997    Family History  Problem Relation Age of Onset  . COPD Father     was a smoker  . Arthritis Father   . Asthma Sister   . Cancer Sister     No Known Allergies  Current Outpatient Prescriptions on File Prior to Visit  Medication Sig Dispense Refill  . Ascorbic Acid (VITAMIN C) 500 MG tablet Take 500 mg by mouth daily.      . Febuxostat (ULORIC PO) Take 60 mg by mouth daily.    . fluticasone (FLONASE) 50 MCG/ACT nasal spray Place 2 sprays into both nostrils daily. 16 g 6  . hydrocortisone (ANUSOL-HC) 2.5 % rectal cream Place rectally 2 (two) times daily. 30 g 3  .  PROAIR HFA 108 (90 BASE) MCG/ACT inhaler INHALE 2 PUFFS INTO THE LUNGS EVERY 6 (SIX) HOURS AS NEEDED FOR WHEEZING. 8.5 g 5   No current facility-administered medications on file prior to visit.    BP 132/80 mmHg  Pulse 73  Temp(Src) 97.5 F (36.4 C) (Oral)  Resp 20  Ht 5' 11.5" (1.816 m)  Wt 234 lb (106.142 kg)  BMI 32.19 kg/m2  SpO2 94%      Review of Systems  Constitutional: Negative for fever, chills, appetite change and fatigue.  HENT: Negative for congestion, dental problem, ear pain, hearing loss, sore throat, tinnitus, trouble swallowing and voice change.   Eyes: Negative for pain, discharge and visual disturbance.  Respiratory: Positive for cough. Negative for chest tightness, wheezing and stridor.   Cardiovascular: Positive for chest pain. Negative for palpitations and leg swelling.  Gastrointestinal: Negative for nausea, vomiting, abdominal pain, diarrhea, constipation, blood in stool and abdominal distention.  Genitourinary: Negative for urgency, hematuria, flank pain, discharge, difficulty urinating and genital sores.  Musculoskeletal: Negative for myalgias, back pain, joint swelling, arthralgias, gait  problem and neck stiffness.  Skin: Negative for rash.  Neurological: Negative for dizziness, syncope, speech difficulty, weakness, numbness and headaches.  Hematological: Negative for adenopathy. Does not bruise/bleed easily.  Psychiatric/Behavioral: Negative for behavioral problems and dysphoric mood. The patient is not nervous/anxious.        Objective:   Physical Exam  Constitutional: He is oriented to person, place, and time. He appears well-developed.  HENT:  Head: Normocephalic.  Right Ear: External ear normal.  Left Ear: External ear normal.  Eyes: Conjunctivae and EOM are normal.  Neck: Normal range of motion.  Cardiovascular: Normal rate and normal heart sounds.   Slow and regular  Pulmonary/Chest: Breath sounds normal. He exhibits tenderness.    Tenderness along the left lower costochondral junction  Abdominal: Bowel sounds are normal.  Musculoskeletal: Normal range of motion. He exhibits no edema or tenderness.  Neurological: He is alert and oriented to person, place, and time.  Psychiatric: He has a normal mood and affect. His behavior is normal.          Assessment & Plan:   Left anterior chest wall pain, possibly aggravated by cough or perhaps trauma from the colonoscopy procedure.  Patient reassured

## 2014-11-25 ENCOUNTER — Telehealth: Payer: Self-pay | Admitting: Internal Medicine

## 2014-11-25 MED ORDER — AVANAFIL 200 MG PO TABS: 0.5000 | ORAL_TABLET | ORAL | Status: DC | PRN

## 2014-11-25 NOTE — Telephone Encounter (Signed)
Pt notified Rx sent to pharmacy

## 2014-11-25 NOTE — Telephone Encounter (Signed)
The following rx was called in 11/10/14 . Pt just went today to pick up rx and was told they donot have .    rx Avanafil 200 MG [place]

## 2014-12-03 ENCOUNTER — Encounter: Payer: Self-pay | Admitting: Internal Medicine

## 2015-06-24 ENCOUNTER — Other Ambulatory Visit: Payer: Self-pay | Admitting: Internal Medicine

## 2016-01-02 ENCOUNTER — Ambulatory Visit (INDEPENDENT_AMBULATORY_CARE_PROVIDER_SITE_OTHER): Payer: BLUE CROSS/BLUE SHIELD | Admitting: Family Medicine

## 2016-01-02 ENCOUNTER — Encounter: Payer: Self-pay | Admitting: Family Medicine

## 2016-01-02 VITALS — BP 140/90 | HR 78 | Temp 98.8°F | Ht 71.5 in | Wt 240.0 lb

## 2016-01-02 DIAGNOSIS — K5732 Diverticulitis of large intestine without perforation or abscess without bleeding: Secondary | ICD-10-CM | POA: Diagnosis not present

## 2016-01-02 MED ORDER — ALBUTEROL SULFATE HFA 108 (90 BASE) MCG/ACT IN AERS: INHALATION_SPRAY | RESPIRATORY_TRACT | Status: DC

## 2016-01-02 MED ORDER — CIPROFLOXACIN HCL 500 MG PO TABS: 500.0000 mg | ORAL_TABLET | Freq: Two times a day (BID) | ORAL | Status: DC

## 2016-01-02 MED ORDER — METRONIDAZOLE 500 MG PO TABS: 500.0000 mg | ORAL_TABLET | Freq: Three times a day (TID) | ORAL | Status: DC

## 2016-01-02 NOTE — Progress Notes (Signed)
Pre visit review using our clinic review tool, if applicable. No additional management support is needed unless otherwise documented below in the visit note. 

## 2016-01-04 ENCOUNTER — Encounter: Payer: Self-pay | Admitting: Family Medicine

## 2016-01-04 NOTE — Progress Notes (Signed)
   Subjective:    Patient ID: Jason Weaver, male    DOB: 1955/11/18, 60 y.o.   MRN: [id number]  HPI Here for the onset yesterday of what he thinks is diverticulitis. He has had this several times before and this time it feels the same. He has sharp intermittent LLQ pains. No fever or nausea. No urinary symptoms.    Review of Systems  Constitutional: Negative.   Gastrointestinal: Positive for abdominal pain. Negative for nausea, vomiting, diarrhea, constipation, blood in stool, abdominal distention, anal bleeding and rectal pain.  Genitourinary: Negative.        Objective:   Physical Exam  Constitutional: He appears well-developed and well-nourished. No distress.  Cardiovascular: Normal rate, regular rhythm, normal heart sounds and intact distal pulses.   Pulmonary/Chest: Effort normal and breath sounds normal.  Abdominal: Soft. Bowel sounds are normal. He exhibits no distension and no mass. There is no rebound and no guarding.  Mildly tender in the LLQ          Assessment & Plan:  Diverticulitis, treat with Cipro and Flagyl. Recheck prn

## 2016-01-05 ENCOUNTER — Emergency Department (HOSPITAL_COMMUNITY)
Admission: EM | Admit: 2016-01-05 | Discharge: 2016-01-05 | Disposition: A | Discharge status: Home / Self Care | Payer: BLUE CROSS/BLUE SHIELD | Attending: Emergency Medicine | Admitting: Emergency Medicine

## 2016-01-05 ENCOUNTER — Telehealth: Payer: Self-pay | Admitting: Internal Medicine

## 2016-01-05 ENCOUNTER — Encounter (HOSPITAL_COMMUNITY): Payer: Self-pay

## 2016-01-05 ENCOUNTER — Emergency Department (HOSPITAL_COMMUNITY): Payer: BLUE CROSS/BLUE SHIELD

## 2016-01-05 DIAGNOSIS — Z8709 Personal history of other diseases of the respiratory system: Secondary | ICD-10-CM | POA: Insufficient documentation

## 2016-01-05 DIAGNOSIS — Z79899 Other long term (current) drug therapy: Secondary | ICD-10-CM | POA: Insufficient documentation

## 2016-01-05 DIAGNOSIS — Z8739 Personal history of other diseases of the musculoskeletal system and connective tissue: Secondary | ICD-10-CM | POA: Diagnosis not present

## 2016-01-05 DIAGNOSIS — E669 Obesity, unspecified: Secondary | ICD-10-CM | POA: Insufficient documentation

## 2016-01-05 DIAGNOSIS — Z86010 Personal history of colonic polyps: Secondary | ICD-10-CM | POA: Insufficient documentation

## 2016-01-05 DIAGNOSIS — K5732 Diverticulitis of large intestine without perforation or abscess without bleeding: Secondary | ICD-10-CM | POA: Insufficient documentation

## 2016-01-05 DIAGNOSIS — H9190 Unspecified hearing loss, unspecified ear: Secondary | ICD-10-CM | POA: Diagnosis not present

## 2016-01-05 DIAGNOSIS — Z87448 Personal history of other diseases of urinary system: Secondary | ICD-10-CM | POA: Insufficient documentation

## 2016-01-05 DIAGNOSIS — R1032 Left lower quadrant pain: Secondary | ICD-10-CM | POA: Diagnosis present

## 2016-01-05 DIAGNOSIS — Z862 Personal history of diseases of the blood and blood-forming organs and certain disorders involving the immune mechanism: Secondary | ICD-10-CM | POA: Insufficient documentation

## 2016-01-05 LAB — COMPREHENSIVE METABOLIC PANEL
ALBUMIN: 4.5 g/dL (ref 3.5–5.0)
ALT: 28 U/L (ref 17–63)
AST: 23 U/L (ref 15–41)
Alkaline Phosphatase: 65 U/L (ref 38–126)
Anion gap: 10 (ref 5–15)
BUN: 15 mg/dL (ref 6–20)
CO2: 25 mmol/L (ref 22–32)
CREATININE: 1.17 mg/dL (ref 0.61–1.24)
Calcium: 9.6 mg/dL (ref 8.9–10.3)
Chloride: 105 mmol/L (ref 101–111)
GFR calc Af Amer: >60 mL/min (ref >60)
Glucose, Bld: 108 mg/dL — ABNORMAL HIGH (ref 65–99)
Potassium: 4.4 mmol/L (ref 3.5–5.1)
SODIUM: 140 mmol/L (ref 135–145)
TOTAL PROTEIN: 7.5 g/dL (ref 6.5–8.1)
Total Bilirubin: 0.8 mg/dL (ref 0.3–1.2)

## 2016-01-05 LAB — CBC
HCT: 46.9 % (ref 39.0–52.0)
Hemoglobin: 16.3 g/dL (ref 13.0–17.0)
MCH: 31.8 pg (ref 26.0–34.0)
MCHC: 34.8 g/dL (ref 30.0–36.0)
MCV: 91.4 fL (ref 78.0–100.0)
PLATELETS: 260 10*3/uL (ref 150–400)
RBC: 5.13 MIL/uL (ref 4.22–5.81)
RDW: 13.5 % (ref 11.5–15.5)
WBC: 10.5 10*3/uL (ref 4.0–10.5)

## 2016-01-05 LAB — URINALYSIS, ROUTINE W REFLEX MICROSCOPIC
Bilirubin Urine: NEGATIVE
GLUCOSE, UA: NEGATIVE mg/dL
Hgb urine dipstick: NEGATIVE
KETONES UR: NEGATIVE mg/dL
NITRITE: NEGATIVE
PROTEIN: NEGATIVE mg/dL
Specific Gravity, Urine: 1.025 (ref 1.005–1.030)
pH: 5.5 (ref 5.0–8.0)

## 2016-01-05 LAB — URINE MICROSCOPIC-ADD ON: RBC / HPF: NONE SEEN RBC/hpf (ref 0–5)

## 2016-01-05 LAB — LIPASE, BLOOD: LIPASE: 33 U/L (ref 11–51)

## 2016-01-05 MED ORDER — SODIUM CHLORIDE 0.9 % IV BOLUS (SEPSIS)
1000.0000 mL | Freq: Once | INTRAVENOUS | Status: AC
  Administered 2016-01-05: 1000 mL via INTRAVENOUS

## 2016-01-05 MED ORDER — IOHEXOL 300 MG/ML  SOLN
25.0000 mL | Freq: Once | INTRAMUSCULAR | Status: AC | PRN
  Administered 2016-01-05: 25 mL via ORAL

## 2016-01-05 MED ORDER — HYDROMORPHONE HCL 1 MG/ML IJ SOLN
1.0000 mg | Freq: Once | INTRAMUSCULAR | Status: AC
  Administered 2016-01-05: 1 mg via INTRAVENOUS
  Filled 2016-01-05: qty 1

## 2016-01-05 MED ORDER — IOHEXOL 300 MG/ML  SOLN
100.0000 mL | Freq: Once | INTRAMUSCULAR | Status: AC | PRN
  Administered 2016-01-05: 100 mL via INTRAVENOUS

## 2016-01-05 MED ORDER — OXYCODONE-ACETAMINOPHEN 5-325 MG PO TABS: 2.0000 | ORAL_TABLET | Freq: Three times a day (TID) | ORAL | Status: DC | PRN

## 2016-01-05 NOTE — Telephone Encounter (Signed)
Spoke with Dr. Sarajane Jews - he advised me to tell patient to go on to the ED, because they would need lab work as well as a CT, and that way they can get everything done there that the patient needs. I called and spoke with patient and advised him of Dr. Barbie Banner comments. Patient verbalized understanding.

## 2016-01-05 NOTE — Telephone Encounter (Signed)
Pt is in a lot of pain.  Pt not sure if he needs to wait much longer, due to his severe pain. Transferred pt to triage.

## 2016-01-05 NOTE — ED Provider Notes (Signed)
CSN: [id number]     Arrival date & time 01/05/16  1838 History   First MD Initiated Contact with Patient 01/05/16 2110     Chief Complaint  Patient presents with  . Abdominal Pain  . Diarrhea   (Consider location/radiation/quality/duration/timing/severity/associated sxs/prior Treatment) HPI  60 y.o. male with a hx of Diverticulitis, presents to the Emergency Department today complaining of LLQ pain x4days. Notes that he saw his PCP earlier in the week and diagnosed with Diverticulitis. Rx Cipro + Flagyl. Notes pain is worsening. Pain is 8/10 Throbbing sensation. Has not tried an OTC relief. No CP/SOB. No N/V. Notes diarrhea. No hematochezia. No headache. No fever. No other symptoms noted.   Past Medical History  Diagnosis Date  . Hyperlipidemia   . ED (erectile dysfunction)   . Gout   . Diverticulosis of colon   . Hx of colonic polyps   . Nasal septal deviation   . Obesity   . Hearing loss   . Pulmonary sarcoidosis ([place])  1997     biopsy proven   Past Surgical History  Procedure Laterality Date  . Hernia repair  2009  . Tonsillectomy  1979  . Cataract extraction    . Lung biopsy  1997   Family History  Problem Relation Age of Onset  . COPD Father     was a smoker  . Arthritis Father   . Asthma Sister   . Cancer Sister    Social History  Substance Use Topics  . Smoking status: Never Smoker   . Smokeless tobacco: Never Used  . Alcohol Use: 0.0 oz/week    0 Standard drinks or equivalent per week    Review of Systems ROS reviewed and all are negative for acute change except as noted in the HPI.  Allergies  Review of patient's allergies indicates no known allergies.  Home Medications   Prior to Admission medications   Medication Sig Start Date End Date Taking? Authorizing Provider  albuterol (PROAIR HFA) 108 (90 Base) MCG/ACT inhaler INHALE 2 PUFFS INTO THE LUNGS EVERY 6 (SIX) HOURS AS NEEDED FOR WHEEZING. 01/02/16  Yes Laurey Morale, MD  Ascorbic Acid (VITAMIN C)  500 MG tablet Take 500 mg by mouth daily.     Yes Historical Provider, MD  ciprofloxacin (CIPRO) 500 MG tablet Take 1 tablet (500 mg total) by mouth 2 (two) times daily. 01/02/16  Yes Laurey Morale, MD  metroNIDAZOLE (FLAGYL) 500 MG tablet Take 1 tablet (500 mg total) by mouth 3 (three) times daily. 01/02/16  Yes Laurey Morale, MD  ULORIC 80 MG TABS Take 80 mg by mouth daily. 11/22/15  Yes Historical Provider, MD  hydrocortisone (ANUSOL-HC) 2.5 % rectal cream Place rectally 2 (two) times daily. Patient not taking: Reported on 01/02/2016 11/04/12   Marletta Lor, MD  STENDRA 200 MG TABS TAKE ONE-HALF TO ONE TABLET BY MOUTH AS NEEDED Patient not taking: Reported on 01/05/2016 06/28/15   Marletta Lor, MD  zolpidem (AMBIEN) 10 MG tablet TAKE ONE (1) TABLET BY MOUTH AT BEDTIME AS NEEDED FOR SLEEP. Patient not taking: Reported on 01/05/2016 06/28/15   Marletta Lor, MD   BP 150/93 mmHg  Pulse 80  Temp(Src) 98.3 F (36.8 C) (Oral)  Resp 18  SpO2 100%   Physical Exam  Constitutional: He is oriented to person, place, and time. He appears well-developed and well-nourished.  HENT:  Head: Normocephalic and atraumatic.  Eyes: EOM are normal. Pupils are equal, round, and reactive to  light.  Neck: Normal range of motion. Neck supple. No tracheal deviation present.  Cardiovascular: Normal rate, regular rhythm and normal heart sounds.   No murmur heard. Pulmonary/Chest: Effort normal and breath sounds normal. No respiratory distress. He has no wheezes. He has no rales. He exhibits no tenderness.  Abdominal: Soft. Normal appearance and bowel sounds are normal. There is tenderness in the left lower quadrant. There is guarding. There is no rebound, no CVA tenderness, no tenderness at McBurney's point and negative Murphy's sign.  Musculoskeletal: Normal range of motion.  Neurological: He is alert and oriented to person, place, and time.  Skin: Skin is warm and dry.  Psychiatric: He has a normal  mood and affect. His behavior is normal. Thought content normal.  Nursing note and vitals reviewed.  ED Course  Procedures (including critical care time) Labs Review Labs Reviewed  COMPREHENSIVE METABOLIC PANEL - Abnormal; Notable for the following:    Glucose, Bld 108 (*)    All other components within normal limits  URINALYSIS, ROUTINE W REFLEX MICROSCOPIC (NOT AT [facility]) - Abnormal; Notable for the following:    Color, Urine AMBER (*)    Leukocytes, UA SMALL (*)    All other components within normal limits  URINE MICROSCOPIC-ADD ON - Abnormal; Notable for the following:    Squamous Epithelial / LPF 0-5 (*)    Bacteria, UA RARE (*)    Crystals CA OXALATE CRYSTALS (*)    All other components within normal limits  LIPASE, BLOOD  CBC   Imaging Review Ct Abdomen Pelvis W Contrast  01/05/2016  CLINICAL DATA:  Left lower quadrant pain. On treatment for diverticulitis. EXAM: CT ABDOMEN AND PELVIS WITH CONTRAST TECHNIQUE: Multidetector CT imaging of the abdomen and pelvis was performed using the standard protocol following bolus administration of intravenous contrast. CONTRAST:  3mL OMNIPAQUE IOHEXOL 300 MG/ML SOLN, 123mL OMNIPAQUE IOHEXOL 300 MG/ML SOLN COMPARISON:  None. FINDINGS: Lower chest and abdominal wall: Fatty enlargement of the bilateral inguinal canal. Probable prior umbilical hernia repair. There is calcified bilateral hilar and subcarinal lymphadenopathy with nodular opacities in the visualized right middle lobe. Subpleural/lymphatic nodules seen over the right diaphragm. Hepatobiliary: No focal liver abnormality.No evidence of biliary obstruction or stone. Pancreas: Unremarkable. Spleen: Unremarkable. Adrenals/Urinary Tract: Negative adrenals. No hydronephrosis or stone. Diverticulitis related inflammation contacts the apex of the bladder without evidence of fistula. Reproductive:No pathologic findings. Stomach/Bowel: Focal left lower quadrant inflammation around 1 of multiple  diverticulum. No abscess or free perforation. No obstruction or reactive ileus. No appendicitis. Vascular/Lymphatic: Prominent atheromatous wall thickening for age. No obstruction or aneurysm. No mass or adenopathy. Peritoneal: No ascites or pneumoperitoneum. Musculoskeletal: Facet arthropathy without acute finding. IMPRESSION: 1. Uncomplicated sigmoid diverticulitis. 2. Mediastinal adenopathy and pulmonary nodularity correlating with history of sarcoidosis. Pulmonary nodularity was not appreciated on 2012 chest x-ray comparison. Please ensure patient has appropriate pulmonary follow-up. Electronically Signed   By: Monte Fantasia M.D.   On: 01/05/2016 22:59   I have personally reviewed and evaluated these images and lab results as part of my medical decision-making.   EKG Interpretation None      MDM  I have reviewed and evaluated the relevant laboratory values. I have reviewed and evaluated the relevant imaging studies. I have reviewed the relevant previous healthcare records. I obtained HPI from historian. Patient discussed with supervising physician  ED Course:  Assessment: Pt is a 12yM with hx Diverticulitis who presents with LLQ Pain. Seen by PCP and Dx Diverticulitis. Given Cipro + Flagyl.  Notes worsening pain. On exam, pt in NAD. Nontoxic/nonseptic appearing. VSS. Afebrile. Lungs CTA. Heart RRR. Abdomen TTP LLQ. Labs show no leukocytosis. CT Imaging shows uncomplicated sigmoid diverticulitis. No perf or abscess. Given NS Bolus and Analgesia in ED. Plan is to DC home with analgesics and follow up with PCP for further management. .At time of discharge, Patient is in no acute distress. Vital Signs are stable. Patient is able to ambulate. Patient able to tolerate PO.   Disposition/Plan:  DC Home Additional Verbal discharge instructions given and discussed with patient.  Pt Instructed to f/u with PCP in the next week for evaluation and treatment of symptoms. Return precautions given Pt  acknowledges and agrees with plan  Supervising Physician Julianne Rice, MD   Final diagnoses:  Diverticulitis of large intestine without perforation or abscess without bleeding     Shary Decamp, PA-C 01/05/16 [id number]  Julianne Rice, MD 01/11/16 2253

## 2016-01-05 NOTE — Telephone Encounter (Signed)
Our nurse spoke to him and told him that I advise he go to the ER ASAP since the pain is worse. He will need labs and probably a CT scan. He agreed to go now.

## 2016-01-05 NOTE — Telephone Encounter (Signed)
Patient Name: Jason Weaver DOB: [birth year] Initial Comment Caller States saw dr. Duke Salvia, got med for diverticulitis. was told to call back if not better. he is having extreme abdominal pains, doubled over, cant sleep Nurse Assessment Nurse: Ronnald Ramp, RN, Miranda Date/Time (Eastern Time): 01/05/2016 10:52:14 AM Confirm and document reason for call. If symptomatic, describe symptoms. You must click the next button to save text entered. ---Caller states he was seen on Monday and treated for Diverticulitis. He states the doctor mentioned ordering a CT scan if symptoms did not improve. He called this morning at 830 am and left a message but has not heard back. He is having severe pain that is causing him to double over and wants to know if he should wait for the call back or go on to the ED. Has the patient traveled out of the country within the last 30 days? ---Not Applicable Does the patient have any new or worsening symptoms? ---Yes Will a triage be completed? ---Yes Related visit to physician within the last 2 weeks? ---Yes Does the PT have any chronic conditions? (i.e. diabetes, asthma, etc.) ---No Is this a behavioral health or substance abuse call? ---No Guidelines Guideline Title Affirmed Question Affirmed Notes Abdominal Pain - Male [1] SEVERE pain (e.g., excruciating) AND [2] present > 1 hour Final Disposition User Go to ED Now Ronnald Ramp, RN, Miranda Referrals GO TO FACILITY REFUSED Disagree/Comply: Disagree Disagree/Comply Reason: Wait and see

## 2016-01-05 NOTE — ED Notes (Signed)
Pt c/o generalized abdominal pain and diarrhea x 4 days.  Pain score 8/10.  Pt reports being seen by PCP x 3 days ago and diagnosed w/ diverticulitis.  Pt reports taking Flagyl and Cipro as prescribed and w/o relief.

## 2016-01-05 NOTE — ED Notes (Signed)
Pt reports understanding of discharge information. No questions at time of discharge. Pt discharged in care of spouse.

## 2016-01-05 NOTE — Discharge Instructions (Signed)
Please read and follow all provided instructions.  Your diagnoses today include:  1. Diverticulitis of large intestine without perforation or abscess without bleeding    Tests performed today include:  Vital signs. See below for your results today.   Medications prescribed:   Take ABX as prescribed by your Primary Care  Home care instructions:  Follow any educational materials contained in this packet.  Follow-up instructions: Please follow-up with your primary care provider in the next Week for further management    Return instructions:   Please return to the Emergency Department if you do not get better, if you get worse, or new symptoms OR  - Fever (temperature greater than 101.11F)  - Bleeding that does not stop with holding pressure to the area    -Severe pain (please note that you may be more sore the day after your accident)  - Chest Pain  - Difficulty breathing  - Severe nausea or vomiting  - Inability to tolerate food and liquids  - Passing out  - Skin becoming red around your wounds  - Change in mental status (confusion or lethargy)  - New numbness or weakness     Please return if you have any other emergent concerns.  Additional Information:  Your vital signs today were: BP 131/85 mmHg   Pulse 70   Temp(Src) 97.4 F (36.3 C) (Oral)   Resp 16   SpO2 95% If your blood pressure (BP) was elevated above 135/85 this visit, please have this repeated by your doctor within one month. ---------------

## 2016-01-05 NOTE — Telephone Encounter (Signed)
Pt saw dr fry on 01-02-16 abd issues. Pt said dr fry mention he would order ct scan if no better in a day or so.

## 2016-01-06 ENCOUNTER — Telehealth: Payer: Self-pay | Admitting: Family Medicine

## 2016-01-06 MED ORDER — MORPHINE SULFATE 15 MG PO TABS: 15.0000 mg | ORAL_TABLET | ORAL | Status: DC | PRN

## 2016-01-06 NOTE — Telephone Encounter (Signed)
I reviewed the labs and the CT report form the ER visit, and it still appears that he has diverticulitis. Stay on the current antibiotics but I have found morphine to be more effective for abdominal pain than oxycodone. I wrote a rx for morphine to use over the weekend as needed. Please go to the ER over the weekend if the pain does not improve.

## 2016-01-06 NOTE — Telephone Encounter (Signed)
I spoke with pt and script is ready for pick up.

## 2016-01-06 NOTE — Telephone Encounter (Signed)
[facility] - Client [place] Call Center  Patient Name: Jason Weaver  DOB: 1956/01/14    Initial Comment Caller states dx w/ diverticulitis. was given an abx--    Nurse Assessment  Nurse: Wayne Sever, RN, Tillie Rung Date/Time (Eastern Time): 01/06/2016 2:48:30 PM  Confirm and document reason for call. If symptomatic, describe symptoms. You must click the next button to save text entered. ---Caller states he was diagnosed on Monday with diverticulitis and is on 2 antibiotics. He was given a CT scan last night and given pain pills. He states he is in a lot of pain. He states the pain is getting worse.  Has the patient traveled out of the country within the last 30 days? ---Not Applicable  Does the patient have any new or worsening symptoms? ---Yes  Will a triage be completed? ---Yes  Related visit to physician within the last 2 weeks? ---Yes  Does the PT have any chronic conditions? (i.e. diabetes, asthma, etc.) ---No  Is this a behavioral health or substance abuse call? ---No     Guidelines    Guideline Title Affirmed Question Affirmed Notes  Abdominal Pain - Male [1] SEVERE pain (e.g., excruciating) AND [2] present > 1 hour    Final Disposition User   Go to ED Now Wayne Sever, RN, [place] refused ED outcome, stated he had already been seen yesterday and it did not do anything for him. Advised him of the importance of going back if he is rating a pain a 9/10 but he denied wanting to go  Spoke with Danae Chen at office to notify her that caller refused ED outcome. She states just send the note they will see it in EPIC   Referrals  [place] REFUSED   Disagree/Comply: Disagree  Disagree/Comply Reason: Wait and see

## 2016-01-06 NOTE — Telephone Encounter (Signed)
Please advise 

## 2016-01-18 ENCOUNTER — Telehealth: Payer: Self-pay | Admitting: Internal Medicine

## 2016-01-18 NOTE — Telephone Encounter (Signed)
[place] [facility]  Patient Name: Jason Weaver  DOB: 11/28/1955    Initial Comment Caller states been on 2 antibiotics and finished them now still having complications with my diverticlosis    Nurse Assessment  Nurse: Harlow Mares, RN, Suanne Marker Date/Time (Eastern Time): 01/18/2016 3:59:23 PM  Confirm and document reason for call. If symptomatic, describe symptoms. You must click the next button to save text entered. ---Caller states been on 2 antibiotics and finished them now still having complications with my diverticulosis. Reports abd cramps that began today. Abx were completed earlier this week. Denies blood in stool. Lower abd cramps, below the belly button.  Has the patient traveled out of the country within the last 30 days? ---No  Does the patient have any new or worsening symptoms? ---Yes  Will a triage be completed? ---Yes  Related visit to physician within the last 2 weeks? ---Yes  Does the PT have any chronic conditions? (i.e. diabetes, asthma, etc.) ---Yes  List chronic conditions. ---hx diverticulitis  Is this a behavioral health or substance abuse call? ---No     Guidelines    Guideline Title Affirmed Question Affirmed Notes  Abdominal Pain - Male [1] SEVERE pain (e.g., excruciating) AND [2] present > 1 hour    Final Disposition User   Go to ED Now Harlow Mares, RN, Suanne Marker    Comments  Caller frustrated with the process of triage, citing that he only called for a prescription refill and he has been getting an administrative run around. Nurse attempted to explain that I can call the MD office with request/possible MD office visit for eval but caller hung up.  Nurse called backline in attempt to speak with Dr. Barbie Banner nurse, but nurse was busy seeing patient's with MD. Nurse copy and pasted reports into Epic with request for nurse/MD to call patient when possible to discuss his request for abx to be called in.   Referrals  GO TO FACILITY REFUSED    Disagree/Comply: Disagree  Disagree/Comply Reason: Disagree with instructions

## 2016-01-18 NOTE — Telephone Encounter (Signed)
Pt calling to get refills of medication

## 2016-01-18 NOTE — Telephone Encounter (Signed)
I spoke with [place] and she recommended calling Dr. Raliegh Ip for further advice, tried that and no answer, she will send him a text.

## 2016-01-18 NOTE — Telephone Encounter (Signed)
I spoke with pt and explained that Dr. Sarajane Jews had already left for the day. I recommended pt follow up with his primary care doctor, he had already went through the triage process. He stated that he sent a email to Dr. Raliegh Ip. Today.

## 2016-01-19 NOTE — Telephone Encounter (Signed)
Discussed with Derinda Late this morning and [facility] sent Dr.K a text last evening. Derinda Late text Estill Bamberg this morning and she said Dr. Raliegh Ip took care of it.

## 2016-02-15 ENCOUNTER — Telehealth: Payer: Self-pay | Admitting: Internal Medicine

## 2016-02-15 NOTE — Telephone Encounter (Signed)
Metronidazole 500 #30 one 3 times a day Generic Cipro 500 #20 one twice a day  Vicodin 5/325 #30 one every 6 hours as needed for pain  Offer office visit tomorrow

## 2016-02-15 NOTE — Telephone Encounter (Signed)
Pt saw dr fry in march was given rx for oxycodone. Pt is having another flare up diverticulitis and would like another rx

## 2016-02-15 NOTE — Telephone Encounter (Signed)
Please see message and advise 

## 2016-02-16 MED ORDER — METRONIDAZOLE 500 MG PO TABS: 500.0000 mg | ORAL_TABLET | Freq: Three times a day (TID) | ORAL | Status: DC

## 2016-02-16 MED ORDER — HYDROCODONE-ACETAMINOPHEN 5-325 MG PO TABS: 1.0000 | ORAL_TABLET | Freq: Four times a day (QID) | ORAL | Status: DC | PRN

## 2016-02-16 NOTE — Telephone Encounter (Signed)
Spoke to pt, told him Dr.K will start you on Metronidazole and Cipro again for flare and also Hydrocodone for pain. Pt verbalized understanding and said he can not take Cipro allergic reaction to it last time. Told pt okay then I will just sent Rx for Metronidazole and the Hydrocodone Rx you will need to pickup here at the office will be at the front desk. Pt verbalized understanding. Asked pt if he can come in today to see Dr.K? Pt said no, he will be going out of town. Told pt okay, if symptoms do not improve after antibiotics please call for an appt. Pt verbalized understanding. Rx printed and signed.

## 2016-02-16 NOTE — Telephone Encounter (Signed)
Told Dr.K  that pt could not take Cipro due to allergic reaction and could not come in today due to going out of town. Dr. Raliegh Ip verbalized understanding.

## 2016-02-23 DIAGNOSIS — K5732 Diverticulitis of large intestine without perforation or abscess without bleeding: Secondary | ICD-10-CM | POA: Diagnosis not present

## 2016-02-23 DIAGNOSIS — K602 Anal fissure, unspecified: Secondary | ICD-10-CM | POA: Diagnosis not present

## 2016-03-16 DIAGNOSIS — M79671 Pain in right foot: Secondary | ICD-10-CM | POA: Diagnosis not present

## 2016-03-16 DIAGNOSIS — M1A09X0 Idiopathic chronic gout, multiple sites, without tophus (tophi): Secondary | ICD-10-CM | POA: Diagnosis not present

## 2016-04-06 DIAGNOSIS — M2041 Other hammer toe(s) (acquired), right foot: Secondary | ICD-10-CM | POA: Diagnosis not present

## 2016-04-06 DIAGNOSIS — M79671 Pain in right foot: Secondary | ICD-10-CM | POA: Diagnosis not present

## 2016-04-06 DIAGNOSIS — M7741 Metatarsalgia, right foot: Secondary | ICD-10-CM | POA: Diagnosis not present

## 2016-05-02 DIAGNOSIS — L309 Dermatitis, unspecified: Secondary | ICD-10-CM | POA: Diagnosis not present

## 2016-05-02 DIAGNOSIS — L814 Other melanin hyperpigmentation: Secondary | ICD-10-CM | POA: Diagnosis not present

## 2016-05-02 DIAGNOSIS — L821 Other seborrheic keratosis: Secondary | ICD-10-CM | POA: Diagnosis not present

## 2016-05-09 ENCOUNTER — Ambulatory Visit (INDEPENDENT_AMBULATORY_CARE_PROVIDER_SITE_OTHER): Payer: BLUE CROSS/BLUE SHIELD | Admitting: Internal Medicine

## 2016-05-09 ENCOUNTER — Encounter: Payer: Self-pay | Admitting: Internal Medicine

## 2016-05-09 VITALS — BP 110/78 | HR 66 | Temp 98.1°F | Ht 71.5 in | Wt 235.0 lb

## 2016-05-09 DIAGNOSIS — E785 Hyperlipidemia, unspecified: Secondary | ICD-10-CM

## 2016-05-09 DIAGNOSIS — R42 Dizziness and giddiness: Secondary | ICD-10-CM

## 2016-05-09 DIAGNOSIS — H905 Unspecified sensorineural hearing loss: Secondary | ICD-10-CM

## 2016-05-09 DIAGNOSIS — D860 Sarcoidosis of lung: Secondary | ICD-10-CM

## 2016-05-09 MED ORDER — EZETIMIBE 10 MG PO TABS: 10.0000 mg | ORAL_TABLET | Freq: Every day | ORAL | Status: DC

## 2016-05-09 MED ORDER — ZOLPIDEM TARTRATE 10 MG PO TABS: 10.0000 mg | ORAL_TABLET | Freq: Every evening | ORAL | Status: DC | PRN

## 2016-05-09 NOTE — Progress Notes (Signed)
Subjective:    Patient ID: Jason Weaver, male    DOB: December 06, 1955, 60 y.o.   MRN: [id number]  HPI  60 year old patient who presents today with a chief complaint of a sense of being off balance for the past several weeks.  Symptoms are aggravated by rapid change in position.  He has had a difficult time with activities such as playing golf due to the sense of imbalance.  In 2010, he experienced left-sided sudden hearing loss associated with vertigo and chronic tinnitus.  He remains deaf on the left with chronic tinnitus.  He has seen Dr. Thornell Mule periodically.  He was prescribed some unclear medicine in the past for vertigo which was not well tolerated  He has history of dyslipidemia with statin associated myalgias, and allergic reaction to Crestor with facial swelling  Past Medical History  Diagnosis Date  . Hyperlipidemia   . ED (erectile dysfunction)   . Gout   . Diverticulosis of colon   . Hx of colonic polyps   . Nasal septal deviation   . Obesity   . Hearing loss   . 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 Marital Status: Married    Spouse Name: N/A  . Number of Children: N/A  . Years of Education: N/A   Occupational History  . Government social research officer    Social History Main Topics  . Smoking status: Never Smoker   . Smokeless tobacco: Never Used  . Alcohol Use: 0.0 oz/week    0 Standard drinks or equivalent per week  . Drug Use: Not on file  . Sexual Activity: Not on file   Other Topics Concern  . Not on file   Social History Narrative    Past Surgical History  Procedure Laterality Date  . Hernia repair  2009  . Tonsillectomy  1979  . Cataract extraction    . Lung biopsy  1997    Family History  Problem Relation Age of Onset  . COPD Father     was a smoker  . Arthritis Father   . Asthma Sister   . Cancer Sister     Allergies  Allergen Reactions  . Ciprofloxacin Rash    Current Outpatient  Prescriptions on File Prior to Visit  Medication Sig Dispense Refill  . albuterol (PROAIR HFA) 108 (90 Base) MCG/ACT inhaler INHALE 2 PUFFS INTO THE LUNGS EVERY 6 (SIX) HOURS AS NEEDED FOR WHEEZING. 8.5 g 5  . Ascorbic Acid (VITAMIN C) 500 MG tablet Take 500 mg by mouth daily.      Marland Kitchen HYDROcodone-acetaminophen (NORCO/VICODIN) 5-325 MG tablet Take 1 tablet by mouth every 6 (six) hours as needed for moderate pain. 30 tablet 0  . hydrocortisone (ANUSOL-HC) 2.5 % rectal cream Place rectally 2 (two) times daily. 30 g 3  . oxyCODONE-acetaminophen (PERCOCET/ROXICET) 5-325 MG tablet Take 2 tablets by mouth every 8 (eight) hours as needed for severe pain. 10 tablet 0  . STENDRA 200 MG TABS TAKE ONE-HALF TO ONE TABLET BY MOUTH AS NEEDED 4 tablet 5  . ULORIC 80 MG TABS Take 80 mg by mouth daily.  1   No current facility-administered medications on file prior to visit.    BP 110/78 mmHg  Pulse 66  Temp(Src) 98.1 F (36.7 C) (Oral)  Ht 5' 11.5" (1.816 m)  Wt 235 lb (106.595 kg)  BMI 32.32 kg/m2  SpO2 98%  Review of Systems  Constitutional: Negative for fever, chills, appetite change and fatigue.  HENT: Positive for hearing loss and tinnitus. Negative for congestion, dental problem, ear pain, sore throat, trouble swallowing and voice change.   Eyes: Negative for pain, discharge and visual disturbance.  Respiratory: Negative for cough, chest tightness, wheezing and stridor.   Cardiovascular: Negative for chest pain, palpitations and leg swelling.  Gastrointestinal: Negative for nausea, vomiting, abdominal pain, diarrhea, constipation, blood in stool and abdominal distention.  Genitourinary: Negative for urgency, hematuria, flank pain, discharge, difficulty urinating and genital sores.  Musculoskeletal: Negative for myalgias, back pain, joint swelling, arthralgias, gait problem and neck stiffness.  Skin: Negative for rash.  Neurological: Positive for dizziness. Negative for syncope, speech  difficulty, weakness, numbness and headaches.  Hematological: Negative for adenopathy. Does not bruise/bleed easily.  Psychiatric/Behavioral: Negative for behavioral problems and dysphoric mood. The patient is not nervous/anxious.        Objective:   Physical Exam  Constitutional: He is oriented to person, place, and time. He appears well-developed.  HENT:  Head: Normocephalic.  Right Ear: External ear normal.  Left Ear: External ear normal.  Eyes: Conjunctivae and EOM are normal.  Neck: Normal range of motion.  Cardiovascular: Normal rate and normal heart sounds.   Pulmonary/Chest: Breath sounds normal.  Abdominal: Bowel sounds are normal.  Musculoskeletal: Normal range of motion. He exhibits no edema or tenderness.  Neurological: He is alert and oriented to person, place, and time.  Normal finger to nose testing No drift Romberg.  Normal Able do a tandem walk Not able to stand on left leg as easily as the right   Psychiatric: He has a normal mood and affect. His behavior is normal.          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  Symptoms over the past few weeks  most consistent with mild vertigo.  Will give a trial of a once a day nonsedating antihistamine.  He was urged to follow with Dr. Thornell Mule  Dyslipidemia.  Will give a trial of Zetia.  CPX as scheduled  Nyoka Cowden, MD

## 2016-05-09 NOTE — Patient Instructions (Signed)
ENT follow-up  Schedule your annual exam as her convenience over the next 3-6 months  Using a nonsedating antihistamine such as Claritin or Allegra once daily

## 2016-05-14 ENCOUNTER — Ambulatory Visit (INDEPENDENT_AMBULATORY_CARE_PROVIDER_SITE_OTHER): Payer: BLUE CROSS/BLUE SHIELD | Admitting: Family Medicine

## 2016-05-14 ENCOUNTER — Encounter: Payer: Self-pay | Admitting: Family Medicine

## 2016-05-14 ENCOUNTER — Emergency Department (HOSPITAL_COMMUNITY)
Admission: EM | Admit: 2016-05-14 | Discharge: 2016-05-14 | Disposition: A | Discharge status: Home / Self Care | Payer: BLUE CROSS/BLUE SHIELD | Attending: Emergency Medicine | Admitting: Emergency Medicine

## 2016-05-14 ENCOUNTER — Encounter (HOSPITAL_COMMUNITY): Payer: Self-pay | Admitting: Emergency Medicine

## 2016-05-14 VITALS — BP 122/88 | HR 67 | Temp 98.0°F | Ht 71.5 in | Wt 238.0 lb

## 2016-05-14 DIAGNOSIS — Y929 Unspecified place or not applicable: Secondary | ICD-10-CM | POA: Diagnosis not present

## 2016-05-14 DIAGNOSIS — T63461A Toxic effect of venom of wasps, accidental (unintentional), initial encounter: Secondary | ICD-10-CM | POA: Insufficient documentation

## 2016-05-14 DIAGNOSIS — Y999 Unspecified external cause status: Secondary | ICD-10-CM | POA: Insufficient documentation

## 2016-05-14 DIAGNOSIS — E785 Hyperlipidemia, unspecified: Secondary | ICD-10-CM | POA: Diagnosis not present

## 2016-05-14 DIAGNOSIS — Z79899 Other long term (current) drug therapy: Secondary | ICD-10-CM | POA: Diagnosis not present

## 2016-05-14 DIAGNOSIS — T63444A Toxic effect of venom of bees, undetermined, initial encounter: Secondary | ICD-10-CM

## 2016-05-14 DIAGNOSIS — R42 Dizziness and giddiness: Secondary | ICD-10-CM

## 2016-05-14 DIAGNOSIS — Z86010 Personal history of colonic polyps: Secondary | ICD-10-CM | POA: Insufficient documentation

## 2016-05-14 DIAGNOSIS — Y939 Activity, unspecified: Secondary | ICD-10-CM | POA: Insufficient documentation

## 2016-05-14 LAB — I-STAT TROPONIN, ED: TROPONIN I, POC: 0 ng/mL (ref 0.00–0.08)

## 2016-05-14 LAB — CBC WITH DIFFERENTIAL/PLATELET
BASOS ABS: 0 10*3/uL (ref 0.0–0.1)
BASOS PCT: 1 %
EOS ABS: 0.6 10*3/uL (ref 0.0–0.7)
Eosinophils Relative: 11 %
HEMATOCRIT: 45.8 % (ref 39.0–52.0)
HEMOGLOBIN: 16.2 g/dL (ref 13.0–17.0)
Lymphocytes Relative: 13 %
Lymphs Abs: 0.8 10*3/uL (ref 0.7–4.0)
MCH: 32.1 pg (ref 26.0–34.0)
MCHC: 35.4 g/dL (ref 30.0–36.0)
MCV: 90.9 fL (ref 78.0–100.0)
MONO ABS: 0.7 10*3/uL (ref 0.1–1.0)
Monocytes Relative: 12 %
NEUTROS ABS: 3.8 10*3/uL (ref 1.7–7.7)
NEUTROS PCT: 63 %
Platelets: 215 10*3/uL (ref 150–400)
RBC: 5.04 MIL/uL (ref 4.22–5.81)
RDW: 13.8 % (ref 11.5–15.5)
WBC: 6 10*3/uL (ref 4.0–10.5)

## 2016-05-14 LAB — BASIC METABOLIC PANEL
ANION GAP: 8 (ref 5–15)
BUN: 14 mg/dL (ref 6–20)
CALCIUM: 9.2 mg/dL (ref 8.9–10.3)
CO2: 24 mmol/L (ref 22–32)
Chloride: 103 mmol/L (ref 101–111)
Creatinine, Ser: 1.07 mg/dL (ref 0.61–1.24)
Glucose, Bld: 96 mg/dL (ref 65–99)
Potassium: 4.3 mmol/L (ref 3.5–5.1)
SODIUM: 135 mmol/L (ref 135–145)

## 2016-05-14 MED ORDER — FAMOTIDINE IN NACL 20-0.9 MG/50ML-% IV SOLN
20.0000 mg | Freq: Once | INTRAVENOUS | Status: AC
  Administered 2016-05-14: 20 mg via INTRAVENOUS
  Filled 2016-05-14: qty 50

## 2016-05-14 MED ORDER — SODIUM CHLORIDE 0.9 % IV BOLUS (SEPSIS)
1000.0000 mL | Freq: Once | INTRAVENOUS | Status: AC
  Administered 2016-05-14: 1000 mL via INTRAVENOUS

## 2016-05-14 MED ORDER — EPINEPHRINE 0.3 MG/0.3ML IJ SOAJ: 0.3000 mg | Freq: Once | INTRAMUSCULAR | 0 refills | Status: AC

## 2016-05-14 MED ORDER — DIPHENHYDRAMINE HCL 50 MG/ML IJ SOLN
50.0000 mg | Freq: Once | INTRAMUSCULAR | Status: AC
  Administered 2016-05-14: 50 mg via INTRAVENOUS
  Filled 2016-05-14: qty 1

## 2016-05-14 MED ORDER — METHYLPREDNISOLONE SODIUM SUCC 125 MG IJ SOLR
125.0000 mg | Freq: Once | INTRAMUSCULAR | Status: AC
  Administered 2016-05-14: 125 mg via INTRAVENOUS
  Filled 2016-05-14: qty 2

## 2016-05-14 NOTE — Discharge Instructions (Signed)
Your workup today was unremarkable. It is unlikely your symptoms are due to an allergic reaction given the delayed timecourse. I will give you a prescription for an epi pen just in case in the future. Follow up with your primary care provider this week.

## 2016-05-14 NOTE — ED Provider Notes (Signed)
Patient was stung by a bee on his right anterior thigh 3 days ago. He reports that his leg was markedly swollen until today. He treated himself with Benadryl prior to coming here. This morning he developed numbness in the back of his throat feeling of swelling in the back of his throat. On exam patient is no distress. Alert hallux secretions well. Voice is normal. Oropharynx is normal. Lungs clear auscultation heart regular rate and rhythm abdomen obese, nontender. All 4 extremities at redness swelling or tenderness neurovascular intact. No stinger is present.   Orlie Dakin, MD 05/14/16 1655

## 2016-05-14 NOTE — ED Triage Notes (Signed)
Patient here with complaints of bee sting to right thigh that happened on Friday. Reports allergic reaction today. Difficulty swallowing. Denies SOB. Maintaining airway.

## 2016-05-14 NOTE — ED Provider Notes (Signed)
[facility] DEPT Provider Note   CSN: [id number] Arrival date & time: 05/14/16  1431  First Provider Contact:  First MD Initiated Contact with Patient 05/14/16 1543     History   Chief Complaint Chief Complaint  Patient presents with  . Allergic Reaction  . Insect Bite   HPI   Jason Weaver is an 60 y.o. male with history of left hearing loss, sarcoidosis, and HLD who presents to the ED for evaluation of multiple complaints including trouble swallowing, dizziness, lightheadedness. He states he thinks he is having an allergic reaction. He states he was stung on the right thigh by a wasp two days ago. He states initially his right thigh got very swollen. He took benadryl at home and the swelling resolved. However, today he was at work and felt like his throat was swelling. He states he started feeling some "indigestion" and got lightheaded/dizzy. He states he does have some chronic issues with dizziness due to some vestibular issues but this feels different. Now denies chest pain, sob, abdominal pain, n/v/d. He states the dizziness comes and goes but he is not dizzy now. Denies drooling. Denies shortness of breath. He states he got stung by a wasp once as a child and had  "a very bad whole body reaction."  Past Medical History:  Diagnosis Date  . Diverticulosis of colon   . ED (erectile dysfunction)   . Gout   . Hearing loss   . Hx of colonic polyps   . Hyperlipidemia   . Nasal septal deviation   . Obesity   . 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 OSA (obstructive sleep apnea) 06/04/2011  . Pulmonary sarcoidosis ([place]) 01/11/2011  . DIZZINESS 12/23/2009  . ALLERGIC REACTION 12/01/2009  . GOUT 11/11/2009  . Diverticulosis of large intestine 11/11/2009  . History of colonic polyps 11/11/2009  . Dyslipidemia 10/24/2009  . NEURAL HEARING LOSS UNILATERAL 08/23/2009    Past Surgical History:  Procedure  Laterality Date  . CATARACT EXTRACTION    . HERNIA REPAIR  2009  . LUNG BIOPSY  1997  . TONSILLECTOMY  1979       Home Medications    Prior to Admission medications   Medication Sig Start Date End Date Taking? Authorizing Provider  albuterol (PROAIR HFA) 108 (90 Base) MCG/ACT inhaler INHALE 2 PUFFS INTO THE LUNGS EVERY 6 (SIX) HOURS AS NEEDED FOR WHEEZING. 01/02/16  Yes Laurey Morale, MD  Ascorbic Acid (VITAMIN C) 500 MG tablet Take 500 mg by mouth daily.     Yes Historical Provider, MD  diphenhydrAMINE (BENADRYL) 25 MG tablet Take 25 mg by mouth every 6 (six) hours as needed for allergies.   Yes Historical Provider, MD  hydrocortisone (ANUSOL-HC) 2.5 % rectal cream Place rectally 2 (two) times daily. Patient taking differently: Place 1 application rectally 2 (two) times daily as needed for hemorrhoids.  11/04/12  Yes Marletta Lor, MD  STENDRA 200 MG TABS TAKE ONE-HALF TO ONE TABLET BY MOUTH AS NEEDED Patient taking differently: TAKE ONE-HALF TO ONE TABLET BY MOUTH AS NEEDED for ED 06/28/15  Yes Marletta Lor, MD  valACYclovir (VALTREX) 1000 MG tablet Take 1 g by mouth as directed. Take 1 tablet daily for 3-4 days as needed for flare ups 05/04/16  Yes Historical Provider, MD  zolpidem (AMBIEN) 10 MG tablet Take 1 tablet (10 mg total) by mouth at bedtime  as needed for sleep. 05/09/16  Yes Marletta Lor, MD  ezetimibe (ZETIA) 10 MG tablet Take 1 tablet (10 mg total) by mouth daily. Patient not taking: Reported on 05/14/2016 05/09/16   Marletta Lor, MD  HYDROcodone-acetaminophen (NORCO/VICODIN) 5-325 MG tablet Take 1 tablet by mouth every 6 (six) hours as needed for moderate pain. Patient not taking: Reported on 05/14/2016 02/16/16   Marletta Lor, MD  oxyCODONE-acetaminophen (PERCOCET/ROXICET) 5-325 MG tablet Take 2 tablets by mouth every 8 (eight) hours as needed for severe pain. Patient not taking: Reported on 05/14/2016 01/05/16   Shary Decamp, PA-C    Family  History Family History  Problem Relation Age of Onset  . COPD Father     was a smoker  . Arthritis Father   . Asthma Sister   . Cancer Sister     Social History Social History  Substance Use Topics  . Smoking status: Never Smoker  . Smokeless tobacco: Never Used  . Alcohol use 0.0 oz/week     Allergies   Ciprofloxacin   Review of Systems Review of Systems  All other systems reviewed and are negative.    Physical Exam Updated Vital Signs BP 148/90 (BP Location: Left Arm)   Pulse 67   Temp 97.9 F (36.6 C) (Oral)   Resp 16   Ht 6' (1.829 m)   Wt 106.6 kg   SpO2 95%   BMI 31.87 kg/m   Physical Exam  Constitutional: He is oriented to person, place, and time.  HENT:  Right Ear: External ear normal.  Left Ear: External ear normal.  Nose: Nose normal.  Mouth/Throat: Oropharynx is clear and moist. No oropharyngeal exudate.  No posterior oropharyngeal edema No trismus No drooling No lip or tongue swelling  Eyes: Conjunctivae and EOM are normal. Pupils are equal, round, and reactive to light.  No nystagmus  Neck: Normal range of motion. Neck supple.  Cardiovascular: Normal rate, regular rhythm, normal heart sounds and intact distal pulses.   Pulmonary/Chest: Effort normal and breath sounds normal. No respiratory distress. He has no wheezes. He exhibits no tenderness.  Abdominal: Soft. Bowel sounds are normal. He exhibits no distension. There is no tenderness. There is no rebound and no guarding.  Musculoskeletal: He exhibits no edema.  Neurological: He is alert and oriented to person, place, and time.  Skin: Skin is warm and dry.  Right thigh with small raised area where bee stung. No stinger present. No edema of surrounding tissue. No erythema.  Psychiatric: He has a normal mood and affect.  Nursing note and vitals reviewed.    ED Treatments / Results  Labs (all labs ordered are listed, but only abnormal results are displayed) Labs Reviewed  BASIC  METABOLIC PANEL  CBC WITH DIFFERENTIAL/PLATELET  Randolm Idol, ED    EKG  EKG Interpretation  Date/Time:  Monday May 14 2016 17:26:29 EDT Ventricular Rate:  60 PR Interval:    QRS Duration: 93 QT Interval:  437 QTC Calculation: 437 R Axis:   51 Text Interpretation:  Sinus rhythm Borderline prolonged PR interval No old tracing to compare Confirmed by [facility] MD, Corene Cornea [phone]) on 05/14/2016 5:34:22 PM       Radiology No results found.  Procedures Procedures (including critical care time)  Medications Ordered in ED Medications  sodium chloride 0.9 % bolus 1,000 mL (0 mLs Intravenous Stopped 05/14/16 1807)  methylPREDNISolone sodium succinate (SOLU-MEDROL) 125 mg/2 mL injection 125 mg (125 mg Intravenous Given 05/14/16 1631)  famotidine (PEPCID)  IVPB 20 mg premix (0 mg Intravenous Stopped 05/14/16 1707)  diphenhydrAMINE (BENADRYL) injection 50 mg (50 mg Intravenous Given 05/14/16 1631)     Initial Impression / Assessment and Plan / ED Course  I have reviewed the triage vital signs and the nursing notes.  Pertinent labs & imaging results that were available during my care of the patient were reviewed by me and considered in my medical decision making (see chart for details).  Given timeline doubt allergic reaction this far out. Per pt's concerns he was given a dose of solu-medrol, benadryl, and pepcid. His workup is otherwise unrevealing. He states he feels reassured and now feels much better in the ED. Suspect some anxietal component to pt's symptoms. Given his history of what sounds like anaphylaxis we will give rx for an epi pen. Otherwise pt is nontoxic appearing and stable for discharge. Instructed close PCP follow up. ER return precautions given.   Final Clinical Impressions(s) / ED Diagnoses   Final diagnoses:  Wasp sting, accidental or unintentional, initial encounter  Dizziness    New Prescriptions Discharge Medication List as of 05/14/2016  7:17 PM    START  taking these medications   Details  EPINEPHrine 0.3 mg/0.3 mL IJ SOAJ injection Inject 0.3 mLs (0.3 mg total) into the muscle once., Starting Mon 05/14/2016, Print         Anne Ng, PA-C 05/14/16 2024    Orlie Dakin, MD 05/15/16 [id number]

## 2016-05-14 NOTE — Progress Notes (Signed)
HPI:  Jason Weaver is a pleasant 60 yo here for an acute walk in for a bee sting. On triage he has history of bees sting allergy as a child. He was stung 3 days ago on his R thigh and had local redness and swelling that he reports involved the entire leg - improved with benadryl. However, he started having some loss of appetite and difficulty swallowing today and went to urgent care and was upset that they made him wait so he came here.  He denies fevers, difficulty breathing, SOB, stridor, vomiting, diarrhea, sinus congestion. He has been taking benadryl. Reports he knows this is an allergic reaction, feels throat is closing like when he had to be hospitalized as a child when had severe allergic reaction to a bee sting. He does not carry an epi pen.  ROS: See pertinent positives and negatives per HPI.  Past Medical History:  Diagnosis Date  . Diverticulosis of colon   . ED (erectile dysfunction)   . Gout   . Hearing loss   . Hx of colonic polyps   . Hyperlipidemia   . Nasal septal deviation   . Obesity   . 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 CATARACT EXTRACTION    . HERNIA REPAIR  2009  . LUNG BIOPSY  1997  . TONSILLECTOMY  1979    Family History  Problem Relation Age of Onset  . COPD Father     was a smoker  . Arthritis Father   . Asthma Sister   . Cancer Sister     Social History   Social History  . Marital status: Married    Spouse name: N/A  . Number of children: N/A  . Years of education: N/A   Occupational History  . Project Therapist, occupational   Social History Main Topics  . Smoking status: Never Smoker  . Smokeless tobacco: Never Used  . Alcohol use 0.0 oz/week  . Drug use: Unknown  . Sexual activity: Not Asked   Other Topics Concern  . None   Social History Narrative  . None    No current facility-administered medications for this visit.   Current Outpatient  Prescriptions:  .  albuterol (PROAIR HFA) 108 (90 Base) MCG/ACT inhaler, INHALE 2 PUFFS INTO THE LUNGS EVERY 6 (SIX) HOURS AS NEEDED FOR WHEEZING., Disp: 8.5 g, Rfl: 5 .  Ascorbic Acid (VITAMIN C) 500 MG tablet, Take 500 mg by mouth daily.  , Disp: , Rfl:  .  ezetimibe (ZETIA) 10 MG tablet, Take 1 tablet (10 mg total) by mouth daily. (Patient not taking: Reported on 05/14/2016), Disp: 90 tablet, Rfl: 3 .  HYDROcodone-acetaminophen (NORCO/VICODIN) 5-325 MG tablet, Take 1 tablet by mouth every 6 (six) hours as needed for moderate pain. (Patient not taking: Reported on 05/14/2016), Disp: 30 tablet, Rfl: 0 .  hydrocortisone (ANUSOL-HC) 2.5 % rectal cream, Place rectally 2 (two) times daily. (Patient taking differently: Place 1 application rectally 2 (two) times daily as needed for hemorrhoids. ), Disp: 30 g, Rfl: 3 .  oxyCODONE-acetaminophen (PERCOCET/ROXICET) 5-325 MG tablet, Take 2 tablets by mouth every 8 (eight) hours as needed for severe pain. (Patient not taking: Reported on 05/14/2016), Disp: 10 tablet, Rfl: 0 .  STENDRA 200 MG TABS, TAKE ONE-HALF TO ONE TABLET BY MOUTH AS NEEDED (Patient taking differently: TAKE ONE-HALF TO ONE TABLET BY MOUTH AS NEEDED for ED), Disp: 4  tablet, Rfl: 5 .  zolpidem (AMBIEN) 10 MG tablet, Take 1 tablet (10 mg total) by mouth at bedtime as needed for sleep., Disp: 30 tablet, Rfl: 5 .  diphenhydrAMINE (BENADRYL) 25 MG tablet, Take 25 mg by mouth every 6 (six) hours as needed for allergies., Disp: , Rfl:  .  valACYclovir (VALTREX) 1000 MG tablet, Take 1 g by mouth as directed. Take 1 tablet daily for 3-4 days as needed for flare ups, Disp: , Rfl: 2  Facility-Administered Medications Ordered in Other Visits:  .  famotidine (PEPCID) IVPB 20 mg premix, 20 mg, Intravenous, Once, Serena Y Sam, PA-C, Last Rate: 100 mL/hr at 05/14/16 1631, 20 mg at 05/14/16 1631  EXAM:  Vitals:   05/14/16 1352  BP: 122/88  Pulse: 67  Temp: 98 F (36.7 C)    Body mass index is 32.73  kg/m.  GENERAL: vitals reviewed and listed above, alert, oriented, appears well hydrated and in no acute distress  HEENT: atraumatic, conjunttiva clear, no obvious abnormalities on inspection of external nose and ears, no appreciable swelling of the tongue, lips or neck, no stridor  NECK: no obvious masses on inspection  LUNGS: clear to auscultation bilaterally, no wheezes, rales or rhonchi, good air movement  CV: HRRR, edema of the R leg > L with some erythema of skin in patchy distribution r thigh  MS: moves all extremities without noticeable abnormality  PSYCH: pleasant and cooperative, no obvious depression or anxiety  ASSESSMENT AND PLAN:  Discussed the following assessment and plan:  Bee sting reaction, undetermined intent, initial encounter  -he appears well, but is describing symptoms concerning for a serious allergic reaction (feels his throat is closing, difficulty swallowing, GI upset) -discussed various types of reactions and treatment, he truly thinks his throat is closing but refuses emergency tx here or EMS transport -he chose instead to go straight to the hospital -Patient advised to return or notify a doctor immediately if symptoms worsen or persist or new concerns arise.  There are no Patient Instructions on file for this visit.  Colin Benton R., DO

## 2016-05-14 NOTE — Progress Notes (Signed)
Pre visit review using our clinic review tool, if applicable. No additional management support is needed unless otherwise documented below in the visit note. 

## 2016-07-03 ENCOUNTER — Telehealth: Payer: Self-pay | Admitting: Internal Medicine

## 2016-07-03 DIAGNOSIS — H9191 Unspecified hearing loss, right ear: Secondary | ICD-10-CM

## 2016-07-03 DIAGNOSIS — H903 Sensorineural hearing loss, bilateral: Secondary | ICD-10-CM | POA: Diagnosis not present

## 2016-07-03 DIAGNOSIS — H6901 Patulous Eustachian tube, right ear: Secondary | ICD-10-CM | POA: Diagnosis not present

## 2016-07-03 DIAGNOSIS — H9312 Tinnitus, left ear: Secondary | ICD-10-CM | POA: Diagnosis not present

## 2016-07-03 NOTE — Telephone Encounter (Signed)
Pt has had a hearing test done and would like to have a referral to a neurologist for his R ear hearing loss.

## 2016-07-03 NOTE — Telephone Encounter (Signed)
Dr.K, please see message and advise if referral is okay.

## 2016-07-03 NOTE — Telephone Encounter (Signed)
Left message on personal voicemail order for referral to Neurology was done and someone will be contacting you to schedule an appt. Any questions please call office.

## 2016-07-03 NOTE — Telephone Encounter (Signed)
Neurology consultation History left-sided hearing loss

## 2016-07-05 DIAGNOSIS — H9312 Tinnitus, left ear: Secondary | ICD-10-CM | POA: Diagnosis not present

## 2016-07-05 DIAGNOSIS — H903 Sensorineural hearing loss, bilateral: Secondary | ICD-10-CM | POA: Diagnosis not present

## 2016-07-05 DIAGNOSIS — H6901 Patulous Eustachian tube, right ear: Secondary | ICD-10-CM | POA: Diagnosis not present

## 2016-07-16 DIAGNOSIS — H6901 Patulous Eustachian tube, right ear: Secondary | ICD-10-CM | POA: Diagnosis not present

## 2016-07-16 DIAGNOSIS — H903 Sensorineural hearing loss, bilateral: Secondary | ICD-10-CM | POA: Diagnosis not present

## 2016-07-16 DIAGNOSIS — H9312 Tinnitus, left ear: Secondary | ICD-10-CM | POA: Diagnosis not present

## 2016-07-17 DIAGNOSIS — M25562 Pain in left knee: Secondary | ICD-10-CM | POA: Diagnosis not present

## 2016-07-17 DIAGNOSIS — M25461 Effusion, right knee: Secondary | ICD-10-CM | POA: Diagnosis not present

## 2016-07-17 DIAGNOSIS — M25561 Pain in right knee: Secondary | ICD-10-CM | POA: Diagnosis not present

## 2016-07-17 DIAGNOSIS — M25462 Effusion, left knee: Secondary | ICD-10-CM | POA: Diagnosis not present

## 2016-07-20 ENCOUNTER — Other Ambulatory Visit: Payer: Self-pay | Admitting: Internal Medicine

## 2016-08-16 ENCOUNTER — Encounter (INDEPENDENT_AMBULATORY_CARE_PROVIDER_SITE_OTHER): Payer: Self-pay | Admitting: Sports Medicine

## 2016-08-16 ENCOUNTER — Ambulatory Visit (INDEPENDENT_AMBULATORY_CARE_PROVIDER_SITE_OTHER): Payer: BLUE CROSS/BLUE SHIELD | Admitting: Sports Medicine

## 2016-08-16 VITALS — BP 138/87 | HR 78 | Ht 72.0 in

## 2016-08-16 DIAGNOSIS — M25561 Pain in right knee: Secondary | ICD-10-CM | POA: Diagnosis not present

## 2016-08-16 NOTE — Patient Instructions (Addendum)
We are ordering an MRI for you today.  The imaging office will be calling you to schedule your appointment after we obtain authorization from your insurance company.  Immediately after you schedule your appointment with them please call our office back ([phone]) to schedule a follow-up appointment with me.  This will need to be at least 24 hours after you have the test done to give radiologist and myself time to review the test.  We will discuss further treatment options at your follow up appointment.   

## 2016-08-16 NOTE — Progress Notes (Signed)
Jason Weaver - 60 y.o. male MRN [id number]  Date of birth: 1956/09/03  Office Visit Note: Visit Date: 08/16/2016 PCP: Nyoka Cowden, MD Referred by: Marletta Lor, MD  Assessment & Plan: Visit Diagnoses:  1. Acute pain of right knee     Plan:   Continue ibuprofen or relafen, bracing and will obtain MRI due to worsening mechanical symptoms. Prior x-rays showed only mild subchondral sclerosis of the medial compartment.   Patient Instructions  We are ordering an MRI for you today.  The imaging office will be calling you to schedule your appointment after we obtain authorization from your insurance company.  Immediately after you schedule your appointment with them please call our office back [phone]) to schedule a follow-up appointment with me.  This will need to be at least 24 hours after you have the test done to give radiologist and myself time to review the test.  We will discuss further treatment options at your follow up appointment.      Meds & Orders: No orders of the defined types were placed in this encounter.   Orders Placed This Encounter  Procedures  . MR KNEE RIGHT W CONTRAST    Follow-up: Return for MRI review.   Procedures: No procedures performed  No notes on file   Clinical History: No additional findings.  He reports that he has never smoked. He has never used smokeless tobacco. No results for input(s): HGBA1C, LABURIC in the last 8760 hours.  Subjective: Chief Complaint  Patient presents with  . Right Knee - Pain  . Follow-up   HPI: Patient is status post intra-articular aspiration & injection 4 weeks ago. His pain is better however he continues to have significant mechanical symptoms with working popping & occasional symptoms of giving way but he has not had any falls due to this. Pain is persistent & occasionally awakens him at night. Not relieved with anti-inflammatories rest, ice or elevation. Denies any true overt locking but  does have occasional catching. Hurts to go up & down steps. Continues to have swelling but is improved prior to injection.  Currently taking Advil and using bio-freeze.    ROS Otherwise per HPI.  Objective:  VS:  HT:6' (182.9 cm)   WT:   BMI:     BP:138/87  HR:78bpm  TEMP: ( )  RESP:  Physical Exam  Constitutional: He appears well-developed and well-nourished. No distress.  Pulmonary/Chest: Effort normal. No respiratory distress.  Skin: He is not diaphoretic.  Psychiatric: He has a normal mood and affect. Judgment normal.    Right Knee Exam   Comments:   Overall well line she does have a moderate effusion. 5 flexion contracture. Tenderness palpation along the medial & lateral joint lines. Positive McMurray with palpable click along the medial joint line.  Extensor mechanism is intact. Ligamentously does have 2-3 mm of opening with varus & valgus testing of the right compared left.     Imaging: No results found.  Past Medical/Family/Surgical/Social History: Patient Active Problem List   Diagnosis Date Noted  . OSA (obstructive sleep apnea) 06/04/2011  . Pulmonary sarcoidosis ([place]) 01/11/2011  . DIZZINESS 12/23/2009  . ALLERGIC REACTION 12/01/2009  . GOUT 11/11/2009  . Diverticulosis of large intestine 11/11/2009  . History of colonic polyps 11/11/2009  . Dyslipidemia 10/24/2009  . NEURAL HEARING LOSS UNILATERAL 08/23/2009   Past Medical History:  Diagnosis Date  . Diverticulosis of colon   . ED (erectile dysfunction)   . Gout   .  Hearing loss   . Hx of colonic polyps   . Hyperlipidemia   . Nasal septal deviation   . Obesity   . Pulmonary sarcoidosis ([place])  1997    biopsy proven   Family History  Problem Relation Age of Onset  . COPD Father     was a smoker  . Arthritis Father   . Asthma Sister   . Cancer Sister    Past Surgical History:  Procedure Laterality Date  . CATARACT EXTRACTION    . HERNIA REPAIR  2009  . LUNG BIOPSY  1997  . TONSILLECTOMY   1979   Social History   Occupational History  . Project Therapist, occupational   Social History Main Topics  . Smoking status: Never Smoker  . Smokeless tobacco: Never Used  . Alcohol use 0.0 oz/week  . Drug use: Unknown  . Sexual activity: Not on file

## 2016-08-17 NOTE — Addendum Note (Signed)
Addended by: Daylene Posey T on: 08/17/2016 02:20 PM   Modules accepted: Orders

## 2016-09-03 ENCOUNTER — Ambulatory Visit
Admission: RE | Admit: 2016-09-03 | Discharge: 2016-09-03 | Disposition: A | Discharge status: Home / Self Care | Payer: BLUE CROSS/BLUE SHIELD | Source: Ambulatory Visit | Attending: Sports Medicine | Admitting: Sports Medicine

## 2016-09-03 DIAGNOSIS — M25561 Pain in right knee: Secondary | ICD-10-CM | POA: Diagnosis not present

## 2016-09-05 ENCOUNTER — Ambulatory Visit (INDEPENDENT_AMBULATORY_CARE_PROVIDER_SITE_OTHER): Payer: BLUE CROSS/BLUE SHIELD | Admitting: Sports Medicine

## 2016-09-05 ENCOUNTER — Encounter (INDEPENDENT_AMBULATORY_CARE_PROVIDER_SITE_OTHER): Payer: Self-pay | Admitting: Sports Medicine

## 2016-09-05 VITALS — BP 146/96 | HR 77 | Ht 72.0 in | Wt 235.0 lb

## 2016-09-05 DIAGNOSIS — M1710 Unilateral primary osteoarthritis, unspecified knee: Secondary | ICD-10-CM

## 2016-09-05 DIAGNOSIS — S83241D Other tear of medial meniscus, current injury, right knee, subsequent encounter: Secondary | ICD-10-CM

## 2016-09-05 DIAGNOSIS — M25561 Pain in right knee: Secondary | ICD-10-CM

## 2016-09-05 NOTE — Progress Notes (Signed)
Jason Weaver - 60 y.o. male MRN [id number]  Date of birth: 03/10/1956  Office Visit Note: Visit Date: 09/05/2016 PCP: Nyoka Cowden, MD Referred by: Marletta Lor, MD  Subjective: Chief Complaint  Patient presents with  . Right Knee - Follow-up  . Follow-up    Patient is here to discuss MRI results of right knee.   HPI: Patient is feeling significantly improved today compared to last office visit. He has regained the majority of his range of motion. He denies any significant mechanical symptoms at this time. Continues to have issues going up & down steps but has not had any giving way although some symptoms of weakening do occur with going down steps. No significant nighttime awakenings at this time but will intermittently have flareups. ROS: Otherwise per HPI.   Clinical History: MRI R Knee 09/03/16: Medial compartment degenerative change with loose cartilage fragment midline just anterior to the proximal tibia measuring 0.5 cm x 0.9 cm x 1.5 cm. Age advanced osteoarthritis within the patellofemoral & medial compartments most notably in the medial. Initial finding of medial meniscus horizontal tear with an intact lateral meniscus.  He reports that he has never smoked. He has never used smokeless tobacco.  No results for input(s): HGBA1C, LABURIC in the last 8760 hours.  Assessment & Plan: Visit Diagnoses:  1. Acute pain of right knee   2. Arthritis of knee   3. Acute medial meniscus tear of right knee, subsequent encounter    Plan: >50% of this 25 minute visit spent in direct patient counseling and/or coordination of care. Discussion was focused on education regarding the in discussing the pathoetiology and anticipated clinical course of the above condition.  Ultimately the patient's mechanical symptoms have significantly improved. Given the large free body on MRI arthroscopic intervention for both diagnostic & therapeutic purposes could be considered & not have to  follow-up with Dr. Ninfa Linden to discuss this. He may the a partial knee arthroplasty candidate given the focal findings but he would like to hold off on doing anything at this time. The degree of arthritis on MRI is markedly advanced compared to the radiographic findings & I did discuss the he is highly likely to have an acute flareup especially with the free body at some point however when that will occur is unknown. Case was briefly discussed with Dr. Ninfa Linden who agrees that arthroscopic intervention could be considered if persistent worsening symptoms. Otherwise we can consider repeat cortisone injections, and/or viscosupplementation. Follow-up: Return if symptoms worsen or fail to improve.  Meds: No orders of the defined types were placed in this encounter.  Procedures: No notes on file   Objective:  VS:  HT:6' (182.9 cm)   WT:235 lb (106.6 kg)  BMI:31.9    BP:(!) 146/96  HR:77bpm  TEMP: ( )  RESP:  Physical Exam: Adult male. In no acute distress. Alert & appropriate. RIGHT knee: Overall well aligned. Small persistent effusion..  2 flexion contracture. Tenderness to palpation along the medial & lateral joint lines.  No pain or mechanical symptoms with McMurray's today. Extensor mechanism is intact. Ligamentously does have 2-3 mm of opening with varus & valgus testing of the right compared left. Imaging: Mr Knee Right W/o Contrast  Result Date: 09/03/2016 CLINICAL DATA:  Anteromedial right knee pain for 3-4 months. No known injury. EXAM: MRI OF THE RIGHT KNEE WITHOUT CONTRAST TECHNIQUE: Multiplanar, multisequence MR imaging of the knee was performed. No intravenous contrast was administered. COMPARISON:  None. FINDINGS: MENISCI Medial  meniscus: Large horizontal tear in the posterior horn reaches the femoral articular surface. The body is extruded peripherally and markedly degenerated and diminutive. Lateral meniscus:  Intact. LIGAMENTS Cruciates:  Intact. Collaterals:  Intact. CARTILAGE  Patellofemoral: Cartilage loss is most notable along the lateral patellar facet in the inferior pole where there is fairly marked subchondral edema. Medial: Marked cartilage loss is present about the medial compartment with underlying subchondral edema in the medial femoral condyle. Lateral:  Appears preserved. Joint: Small joint effusion. A loose cartilage fragment is seen in the midline just anterior to the proximal tibia. The fragment measures 0.5 cm AP by 0.9 cm craniocaudal by 1.5 cm transverse. Popliteal Fossa: No Baker's cyst. A ganglion or synovial cyst anterior to the popliteus tendon measures 2.8 cm craniocaudal by 0.8 cm transverse by 0.9 cm AP. Extensor Mechanism:  Intact. Bones:  No fracture or worrisome lesion. Other: None. IMPRESSION: Dominant finding is advanced for age appearing osteoarthritis in the patellofemoral and medial compartments, worse in the medial compartment. Associated degeneration and tearing of the posterior horn and body of medial the meniscus are present as described above. 1.5 cm in diameter loose cartilage fragment at the level of the joint line medially anterior to the mid aspect of the tibia. Fluid along the popliteus tendon is compatible with a ganglion or synovial cyst. Electronically Signed   By: Inge Rise M.D.   On: 09/03/2016 14:09      Past Medical/Family/Surgical/Social History: Medications & Allergies reviewed per EMR Patient Active Problem List   Diagnosis Date Noted  . Arthritis of ankle or foot, degenerative, left 04/20/2014  . Ankle sprain, left, initial encounter 04/20/2014  . Allergic rhinitis 05/29/2012  . Dysphagia 05/29/2012  . OSA (obstructive sleep apnea) 06/04/2011  . Pulmonary sarcoidosis ([place]) 01/11/2011  . DIZZINESS 12/23/2009  . ALLERGIC REACTION 12/01/2009  . GOUT 11/11/2009  . Diverticulosis of large intestine 11/11/2009  . History of colonic polyps 11/11/2009  . Dyslipidemia 10/24/2009  . NEURAL HEARING LOSS UNILATERAL  08/23/2009   Past Medical History:  Diagnosis Date  . Diverticulosis of colon   . ED (erectile dysfunction)   . Gout   . Hearing loss   . Hx of colonic polyps   . Hyperlipidemia   . Nasal septal deviation   . Obesity   . Pulmonary sarcoidosis ([place])  1997    biopsy proven   Family History  Problem Relation Age of Onset  . COPD Father     was a smoker  . Arthritis Father   . Asthma Sister   . Cancer Sister    Past Surgical History:  Procedure Laterality Date  . CATARACT EXTRACTION    . HERNIA REPAIR  2009  . LUNG BIOPSY  1997  . TONSILLECTOMY  1979   Social History   Occupational History  . Project Therapist, occupational   Social History Main Topics  . Smoking status: Never Smoker  . Smokeless tobacco: Never Used  . Alcohol use 0.0 oz/week  . Drug use: Unknown  . Sexual activity: Not on file

## 2016-09-06 DIAGNOSIS — L218 Other seborrheic dermatitis: Secondary | ICD-10-CM | POA: Diagnosis not present

## 2016-09-06 DIAGNOSIS — L720 Epidermal cyst: Secondary | ICD-10-CM | POA: Diagnosis not present

## 2016-09-06 DIAGNOSIS — L820 Inflamed seborrheic keratosis: Secondary | ICD-10-CM | POA: Diagnosis not present

## 2016-09-06 DIAGNOSIS — L821 Other seborrheic keratosis: Secondary | ICD-10-CM | POA: Diagnosis not present

## 2016-09-07 DIAGNOSIS — M79671 Pain in right foot: Secondary | ICD-10-CM | POA: Diagnosis not present

## 2016-09-07 DIAGNOSIS — M1A09X0 Idiopathic chronic gout, multiple sites, without tophus (tophi): Secondary | ICD-10-CM | POA: Diagnosis not present

## 2016-09-10 DIAGNOSIS — H8101 Meniere's disease, right ear: Secondary | ICD-10-CM | POA: Diagnosis not present

## 2016-09-10 DIAGNOSIS — H9312 Tinnitus, left ear: Secondary | ICD-10-CM | POA: Diagnosis not present

## 2016-09-10 DIAGNOSIS — H903 Sensorineural hearing loss, bilateral: Secondary | ICD-10-CM | POA: Diagnosis not present

## 2016-09-18 ENCOUNTER — Ambulatory Visit
Admission: RE | Admit: 2016-09-18 | Discharge: 2016-09-18 | Disposition: A | Discharge status: Home / Self Care | Payer: BLUE CROSS/BLUE SHIELD | Source: Ambulatory Visit | Attending: Rheumatology | Admitting: Rheumatology

## 2016-09-18 ENCOUNTER — Other Ambulatory Visit: Payer: Self-pay | Admitting: Rheumatology

## 2016-09-18 DIAGNOSIS — M109 Gout, unspecified: Secondary | ICD-10-CM | POA: Diagnosis not present

## 2016-09-18 DIAGNOSIS — D869 Sarcoidosis, unspecified: Secondary | ICD-10-CM | POA: Diagnosis not present

## 2016-09-18 DIAGNOSIS — R0602 Shortness of breath: Secondary | ICD-10-CM

## 2016-09-18 DIAGNOSIS — H8109 Meniere's disease, unspecified ear: Secondary | ICD-10-CM | POA: Diagnosis not present

## 2016-09-18 DIAGNOSIS — Z79899 Other long term (current) drug therapy: Secondary | ICD-10-CM | POA: Diagnosis not present

## 2016-09-24 DIAGNOSIS — H9312 Tinnitus, left ear: Secondary | ICD-10-CM | POA: Diagnosis not present

## 2016-09-24 DIAGNOSIS — H8101 Meniere's disease, right ear: Secondary | ICD-10-CM | POA: Diagnosis not present

## 2016-09-24 DIAGNOSIS — H903 Sensorineural hearing loss, bilateral: Secondary | ICD-10-CM | POA: Diagnosis not present

## 2016-09-27 DIAGNOSIS — D3617 Benign neoplasm of peripheral nerves and autonomic nervous system of trunk, unspecified: Secondary | ICD-10-CM | POA: Diagnosis not present

## 2016-10-23 DIAGNOSIS — H8109 Meniere's disease, unspecified ear: Secondary | ICD-10-CM | POA: Diagnosis not present

## 2016-10-23 DIAGNOSIS — Z79899 Other long term (current) drug therapy: Secondary | ICD-10-CM | POA: Diagnosis not present

## 2016-10-23 DIAGNOSIS — M545 Low back pain: Secondary | ICD-10-CM | POA: Diagnosis not present

## 2016-10-23 DIAGNOSIS — M109 Gout, unspecified: Secondary | ICD-10-CM | POA: Diagnosis not present

## 2016-11-08 DIAGNOSIS — H8101 Meniere's disease, right ear: Secondary | ICD-10-CM | POA: Diagnosis not present

## 2016-11-08 DIAGNOSIS — H903 Sensorineural hearing loss, bilateral: Secondary | ICD-10-CM | POA: Diagnosis not present

## 2016-11-08 DIAGNOSIS — H9312 Tinnitus, left ear: Secondary | ICD-10-CM | POA: Diagnosis not present

## 2016-11-16 DIAGNOSIS — M545 Low back pain: Secondary | ICD-10-CM | POA: Diagnosis not present

## 2016-11-16 DIAGNOSIS — Z79899 Other long term (current) drug therapy: Secondary | ICD-10-CM | POA: Diagnosis not present

## 2016-11-16 DIAGNOSIS — M109 Gout, unspecified: Secondary | ICD-10-CM | POA: Diagnosis not present

## 2016-12-27 DIAGNOSIS — Z79899 Other long term (current) drug therapy: Secondary | ICD-10-CM | POA: Diagnosis not present

## 2016-12-27 DIAGNOSIS — H8109 Meniere's disease, unspecified ear: Secondary | ICD-10-CM | POA: Diagnosis not present

## 2017-01-14 ENCOUNTER — Encounter: Payer: Self-pay | Admitting: Family Medicine

## 2017-01-14 ENCOUNTER — Ambulatory Visit (INDEPENDENT_AMBULATORY_CARE_PROVIDER_SITE_OTHER): Payer: BLUE CROSS/BLUE SHIELD | Admitting: Family Medicine

## 2017-01-14 ENCOUNTER — Ambulatory Visit (INDEPENDENT_AMBULATORY_CARE_PROVIDER_SITE_OTHER)
Admission: RE | Admit: 2017-01-14 | Discharge: 2017-01-14 | Disposition: A | Discharge status: Home / Self Care | Payer: BLUE CROSS/BLUE SHIELD | Source: Ambulatory Visit | Attending: Family Medicine | Admitting: Family Medicine

## 2017-01-14 VITALS — BP 120/84 | HR 84 | Temp 98.3°F | Wt 242.7 lb

## 2017-01-14 DIAGNOSIS — D171 Benign lipomatous neoplasm of skin and subcutaneous tissue of trunk: Secondary | ICD-10-CM

## 2017-01-14 DIAGNOSIS — M546 Pain in thoracic spine: Secondary | ICD-10-CM

## 2017-01-14 DIAGNOSIS — R0781 Pleurodynia: Secondary | ICD-10-CM

## 2017-01-14 LAB — CBC WITH DIFFERENTIAL/PLATELET
BASOS PCT: 1.3 % (ref 0.0–3.0)
Basophils Absolute: 0.1 10*3/uL (ref 0.0–0.1)
EOS PCT: 6.5 % — AB (ref 0.0–5.0)
Eosinophils Absolute: 0.5 10*3/uL (ref 0.0–0.7)
HCT: 45.3 % (ref 39.0–52.0)
Hemoglobin: 15.9 g/dL (ref 13.0–17.0)
LYMPHS ABS: 0.8 10*3/uL (ref 0.7–4.0)
Lymphocytes Relative: 11.4 % — ABNORMAL LOW (ref 12.0–46.0)
MCHC: 35.1 g/dL (ref 30.0–36.0)
MCV: 93.9 fl (ref 78.0–100.0)
MONO ABS: 0.9 10*3/uL (ref 0.1–1.0)
Monocytes Relative: 12.4 % — ABNORMAL HIGH (ref 3.0–12.0)
NEUTROS ABS: 5 10*3/uL (ref 1.4–7.7)
NEUTROS PCT: 68.4 % (ref 43.0–77.0)
PLATELETS: 275 10*3/uL (ref 150.0–400.0)
RBC: 4.83 Mil/uL (ref 4.22–5.81)
RDW: 14.9 % (ref 11.5–15.5)
WBC: 7.3 10*3/uL (ref 4.0–10.5)

## 2017-01-14 LAB — COMPREHENSIVE METABOLIC PANEL
ALT: 21 U/L (ref 0–53)
AST: 17 U/L (ref 0–37)
Albumin: 4.3 g/dL (ref 3.5–5.2)
Alkaline Phosphatase: 64 U/L (ref 39–117)
BUN: 12 mg/dL (ref 6–23)
CALCIUM: 9.8 mg/dL (ref 8.4–10.5)
CHLORIDE: 103 meq/L (ref 96–112)
CO2: 27 meq/L (ref 19–32)
Creatinine, Ser: 1.05 mg/dL (ref 0.40–1.50)
GFR: 76.86 mL/min (ref >60.00)
GLUCOSE: 87 mg/dL (ref 70–99)
POTASSIUM: 4.4 meq/L (ref 3.5–5.1)
Sodium: 136 mEq/L (ref 135–145)
Total Bilirubin: 0.7 mg/dL (ref 0.2–1.2)
Total Protein: 6.8 g/dL (ref 6.0–8.3)

## 2017-01-14 LAB — SEDIMENTATION RATE: SED RATE: 5 mm/h (ref 0–20)

## 2017-01-14 NOTE — Patient Instructions (Signed)
Follow-up for any fever, loss of appetite, shortness of breath, progressive pain, or if symptoms not improving over the next few weeks We'll call you regarding lab work and x-rays that are done now

## 2017-01-14 NOTE — Progress Notes (Signed)
Subjective:     Patient ID: Jason Weaver, male   DOB: [birth year], 61 y.o.   MRN: [id number]  HPI  Patient seen with approximately 4 month history of right thoracic back pain. No injury. He went for massage and  noted to have lipoma but his pain is actually been below that region not corresponding to the lipoma. He states his pain is somewhat intermittent but present about 70% time. He describes achy pain mild/moderate severity usually 5 out of 10. No alleviating factors. Occasionally worse with movement. He denies any appetite or weight change, night sweats, cough, dyspnea, abdominal pain, nausea or vomiting.  Pain is right thoracic region over right posterior ribs.    No history of smoking. Has not really taken any medications for this. No skin rash.  Past Medical History:  Diagnosis Date  . Diverticulosis of colon   . ED (erectile dysfunction)   . Gout   . Hearing loss   . Hx of colonic polyps   . Hyperlipidemia   . Nasal septal deviation   . Obesity   . Pulmonary sarcoidosis ([place])  1997    biopsy proven   Past Surgical History:  Procedure Laterality Date  . CATARACT EXTRACTION    . HERNIA REPAIR  2009  . LUNG BIOPSY  1997  . TONSILLECTOMY  1979    reports that he has never smoked. He has never used smokeless tobacco. He reports that he drinks alcohol. His drug history is not on file. family history includes Arthritis in his father; Asthma in his sister; COPD in his father; Cancer in his sister. Allergies  Allergen Reactions  . Ciprofloxacin Rash    Review of Systems  Constitutional: Negative for appetite change, chills, fever and unexpected weight change.  Respiratory: Negative for cough and shortness of breath.   Cardiovascular: Negative for chest pain.  Gastrointestinal: Negative for abdominal pain, nausea and vomiting.  Genitourinary: Negative for dysuria.  Musculoskeletal: Positive for back pain.  Neurological: Negative for dizziness.  Hematological: Negative for  adenopathy. Does not bruise/bleed easily.       Objective:   Physical Exam  Constitutional: He appears well-developed and well-nourished.  Cardiovascular: Normal rate and regular rhythm.   Pulmonary/Chest: Effort normal and breath sounds normal. No respiratory distress. He has no wheezes. He has no rales.  Abdominal: Soft. He exhibits no distension and no mass. There is no tenderness. There is no rebound and no guarding.  Musculoskeletal: He exhibits no edema.  Patient has approximately 6 cm diameter lipomatous fatty mass right mid thoracic area. This is nontender and mobile. About 3 cm inferior to this he has area of mild rib tenderness. No overlying skin changes.  Skin: No rash noted.       Assessment:     Several month history of right thoracic back pain. He has lipoma but is not tender in this region.  Has not had any red flags such as fever, appetite or weight changes, night sweats, etc. he has some reproducible tenderness right posterior rib cage area    Plan:     -Given duration of symptoms check CBC, comprehensive metabolic panel, sedimentation rate -Obtain right rib films and chest x-ray.  Eulas Post MD [facility]

## 2017-01-14 NOTE — Progress Notes (Signed)
Pre visit review using our clinic review tool, if applicable. No additional management support is needed unless otherwise documented below in the visit note. 

## 2017-01-17 ENCOUNTER — Telehealth: Payer: Self-pay | Admitting: Internal Medicine

## 2017-01-17 NOTE — Telephone Encounter (Signed)
See results note. 

## 2017-01-17 NOTE — Telephone Encounter (Signed)
Pt returning your call concerning lab results °

## 2017-02-01 ENCOUNTER — Other Ambulatory Visit: Payer: Self-pay | Admitting: Internal Medicine

## 2017-02-14 DIAGNOSIS — D171 Benign lipomatous neoplasm of skin and subcutaneous tissue of trunk: Secondary | ICD-10-CM | POA: Diagnosis not present

## 2017-02-20 ENCOUNTER — Encounter: Payer: Self-pay | Admitting: Internal Medicine

## 2017-02-20 ENCOUNTER — Ambulatory Visit (INDEPENDENT_AMBULATORY_CARE_PROVIDER_SITE_OTHER): Payer: BLUE CROSS/BLUE SHIELD | Admitting: Internal Medicine

## 2017-02-20 VITALS — BP 132/78 | HR 85 | Temp 98.5°F | Ht 72.0 in | Wt 241.2 lb

## 2017-02-20 DIAGNOSIS — R202 Paresthesia of skin: Secondary | ICD-10-CM

## 2017-02-20 DIAGNOSIS — R200 Anesthesia of skin: Secondary | ICD-10-CM | POA: Diagnosis not present

## 2017-02-20 DIAGNOSIS — R071 Chest pain on breathing: Secondary | ICD-10-CM

## 2017-02-20 NOTE — Progress Notes (Signed)
Subjective:    Patient ID: Jason Weaver, male    DOB: 1956/10/20, 61 y.o.   MRN: [id number]  HPI 61 year old patient who is seen today with concerns of chest pain.  He describes a history of episodic cramping and tightness in the upper chest thatis paroxysmal and usually occurs at rest.  He states pain is often aggravated by taking a deep breath.  No exertional chest pain He also complains of a sense of being lightheaded and pressure behind the right eye intermittently for a number of months.  He has a history of acute left-sided hearing loss.  ENT felt this was a autoimmune issue and was referred to rheumatology.  He was treated with methotrexate and prednisone for 3 months and gradually tapered and discontinued. He has a history of pulmonary sarcoidosis Other complaints include the knee pain  Past Medical History:  Diagnosis Date  . Diverticulosis of colon   . ED (erectile dysfunction)   . Gout   . Hearing loss   . Hx of colonic polyps   . Hyperlipidemia   . Nasal septal deviation   . Obesity   . Pulmonary sarcoidosis ([place])  1997    biopsy proven     Social History   Social History  . Marital status: Married    Spouse name: N/A  . Number of children: N/A  . Years of education: N/A   Occupational History  . Project Therapist, occupational   Social History Main Topics  . Smoking status: Never Smoker  . Smokeless tobacco: Never Used  . Alcohol use 0.0 oz/week  . Drug use: Unknown  . Sexual activity: Not on file   Other Topics Concern  . Not on file   Social History Narrative  . No narrative on file    Past Surgical History:  Procedure Laterality Date  . CATARACT EXTRACTION    . HERNIA REPAIR  2009  . LUNG BIOPSY  1997  . TONSILLECTOMY  1979    Family History  Problem Relation Age of Onset  . COPD Father     was a smoker  . Arthritis Father   . Asthma Sister   . Cancer Sister     Allergies  Allergen Reactions  . Ciprofloxacin Rash    Current  Outpatient Prescriptions on File Prior to Visit  Medication Sig Dispense Refill  . albuterol (PROAIR HFA) 108 (90 Base) MCG/ACT inhaler INHALE 2 PUFFS INTO THE LUNGS EVERY 6 (SIX) HOURS AS NEEDED FOR WHEEZING. 8.5 g 5  . Ascorbic Acid (VITAMIN C) 500 MG tablet Take 500 mg by mouth daily.      . diphenhydrAMINE (BENADRYL) 25 MG tablet Take 25 mg by mouth every 6 (six) hours as needed for allergies.    Marland Kitchen ezetimibe (ZETIA) 10 MG tablet Take 1 tablet (10 mg total) by mouth daily. 90 tablet 3  . hydrocortisone (ANUSOL-HC) 2.5 % rectal cream Place rectally 2 (two) times daily. 30 g 3  . STENDRA 200 MG TABS TAKE ONE-HALF TO ONE TABLET BY MOUTH AS NEEDED 4 tablet 4  . ULORIC 80 MG TABS   1  . valACYclovir (VALTREX) 1000 MG tablet Take 1 g by mouth as directed. Take 1 tablet daily for 3-4 days as needed for flare ups  2  . zolpidem (AMBIEN) 10 MG tablet TAKE ONE TABLET BY MOUTH AT BEDTIME AS NEEDED FOR SLEEP 30 tablet 4  . HYDROcodone-acetaminophen (NORCO/VICODIN) 5-325 MG tablet Take 1 tablet by mouth every 6 (six)  hours as needed for moderate pain. (Patient not taking: Reported on 02/20/2017) 30 tablet 0  . oxyCODONE-acetaminophen (PERCOCET/ROXICET) 5-325 MG tablet Take 2 tablets by mouth every 8 (eight) hours as needed for severe pain. 10 tablet 0   No current facility-administered medications on file prior to visit.     BP 132/78 (BP Location: Left Arm, Patient Position: Sitting, Cuff Size: Normal)   Pulse 85   Temp 98.5 F (36.9 C) (Oral)   Ht 6' (1.829 m)   Wt 241 lb 3.2 oz (109.4 kg)   SpO2 98%   BMI 32.71 kg/m      Review of Systems  Constitutional: Negative for appetite change, chills, fatigue and fever.  HENT: Positive for hearing loss. Negative for congestion, dental problem, ear pain, sore throat, tinnitus, trouble swallowing and voice change.   Eyes: Negative for pain, discharge and visual disturbance.  Respiratory: Negative for cough, chest tightness, wheezing and stridor.     Cardiovascular: Positive for chest pain. Negative for palpitations and leg swelling.  Gastrointestinal: Negative for abdominal distention, abdominal pain, blood in stool, constipation, diarrhea, nausea and vomiting.  Genitourinary: Negative for difficulty urinating, discharge, flank pain, genital sores, hematuria and urgency.  Musculoskeletal: Negative for arthralgias, back pain, gait problem, joint swelling, myalgias and neck stiffness.  Skin: Negative for rash.  Neurological: Positive for dizziness, light-headedness and numbness. Negative for syncope, speech difficulty, weakness and headaches.  Hematological: Negative for adenopathy. Does not bruise/bleed easily.  Psychiatric/Behavioral: Negative for behavioral problems and dysphoric mood. The patient is not nervous/anxious.        Objective:   Physical Exam  Constitutional: He is oriented to person, place, and time. He appears well-developed.   Weight 241 Blood pressure 130/80  HENT:  Head: Normocephalic.  Right Ear: External ear normal.  Left Ear: External ear normal.  Eyes: Conjunctivae and EOM are normal.  Neck: Normal range of motion.  Cardiovascular: Normal rate and normal heart sounds.   Pulmonary/Chest: Breath sounds normal.  Abdominal: Bowel sounds are normal.  Musculoskeletal: Normal range of motion. He exhibits no edema or tenderness.  Neurological: He is alert and oriented to person, place, and time.  Psychiatric: He has a normal mood and affect. His behavior is normal.          Assessment & Plan:   Noncardiac chest pain.  EKG reviewed and this was unremarkable.  Patient reassured.  Will report any clinical change.  Risk factor modification.  Encouraged.  Will continue the Zetia Pulmonary sarcoidosis History left-sided hearing loss  Patient will schedule CPX later in the year  Nyoka Cowden

## 2017-02-20 NOTE — Patient Instructions (Addendum)
WE NOW OFFER   [facility] FAST TRACK!!!  SAME DAY Appointments for ACUTE CARE  Such as: Sprains, Injuries, cuts, abrasions, rashes, muscle pain, joint pain, back pain Colds, flu, sore throats, headache, allergies, cough, fever  Ear pain, sinus and eye infections Abdominal pain, nausea, vomiting, diarrhea, upset stomach Animal/insect bites  3 Easy Ways to Schedule: Walk-In Scheduling Call in scheduling Mychart Sign-up: [email or web address]   Call or return to clinic prn if these symptoms worsen or fail to improve as anticipated.  Schedule an annual exam later in the year

## 2017-02-25 DIAGNOSIS — M545 Low back pain: Secondary | ICD-10-CM | POA: Diagnosis not present

## 2017-02-25 DIAGNOSIS — R222 Localized swelling, mass and lump, trunk: Secondary | ICD-10-CM | POA: Diagnosis not present

## 2017-03-08 DIAGNOSIS — M546 Pain in thoracic spine: Secondary | ICD-10-CM | POA: Diagnosis not present

## 2017-06-18 ENCOUNTER — Other Ambulatory Visit: Payer: Self-pay | Admitting: Family Medicine

## 2017-08-27 ENCOUNTER — Other Ambulatory Visit: Payer: Self-pay | Admitting: Internal Medicine

## 2017-08-28 NOTE — Telephone Encounter (Signed)
Pt has nto had OV with Dr Raliegh Ip since 2017. Called to inform that he needs a OV to refill medications. Pt was very uneasy about my call... Stating that he did not recognize me and did not feel comfortable giving me his DOB so that I could verify I was speaking to the correct person and due to HIPAA.

## 2017-09-18 ENCOUNTER — Ambulatory Visit: Payer: BLUE CROSS/BLUE SHIELD | Admitting: Internal Medicine

## 2017-09-18 ENCOUNTER — Encounter: Payer: Self-pay | Admitting: Internal Medicine

## 2017-09-18 VITALS — BP 138/80 | HR 80 | Temp 98.2°F | Ht 72.0 in | Wt 240.8 lb

## 2017-09-18 DIAGNOSIS — Z0000 Encounter for general adult medical examination without abnormal findings: Secondary | ICD-10-CM | POA: Diagnosis not present

## 2017-09-18 LAB — LIPID PANEL
Cholesterol: 277 mg/dL — ABNORMAL HIGH (ref 0–200)
HDL: 43.8 mg/dL (ref >39.00)
NonHDL: 233.1
Total CHOL/HDL Ratio: 6
Triglycerides: 274 mg/dL — ABNORMAL HIGH (ref 0.0–149.0)
VLDL: 54.8 mg/dL — ABNORMAL HIGH (ref 0.0–40.0)

## 2017-09-18 LAB — CBC WITH DIFFERENTIAL/PLATELET
BASOS ABS: 0.1 10*3/uL (ref 0.0–0.1)
Basophils Relative: 1.1 % (ref 0.0–3.0)
EOS ABS: 0.5 10*3/uL (ref 0.0–0.7)
Eosinophils Relative: 7.1 % — ABNORMAL HIGH (ref 0.0–5.0)
HCT: 49.1 % (ref 39.0–52.0)
HEMOGLOBIN: 17.1 g/dL — AB (ref 13.0–17.0)
Lymphocytes Relative: 12.4 % (ref 12.0–46.0)
Lymphs Abs: 0.9 10*3/uL (ref 0.7–4.0)
MCHC: 34.9 g/dL (ref 30.0–36.0)
MCV: 92 fl (ref 78.0–100.0)
MONO ABS: 0.9 10*3/uL (ref 0.1–1.0)
Monocytes Relative: 12.4 % — ABNORMAL HIGH (ref 3.0–12.0)
Neutro Abs: 4.8 10*3/uL (ref 1.4–7.7)
Neutrophils Relative %: 67 % (ref 43.0–77.0)
Platelets: 296 10*3/uL (ref 150.0–400.0)
RBC: 5.33 Mil/uL (ref 4.22–5.81)
RDW: 14.1 % (ref 11.5–15.5)
WBC: 7.1 10*3/uL (ref 4.0–10.5)

## 2017-09-18 LAB — COMPREHENSIVE METABOLIC PANEL
ALBUMIN: 4.4 g/dL (ref 3.5–5.2)
ALK PHOS: 72 U/L (ref 39–117)
ALT: 27 U/L (ref 0–53)
AST: 20 U/L (ref 0–37)
BUN: 20 mg/dL (ref 6–23)
CO2: 27 mEq/L (ref 19–32)
CREATININE: 1.04 mg/dL (ref 0.40–1.50)
Calcium: 9.9 mg/dL (ref 8.4–10.5)
Chloride: 103 mEq/L (ref 96–112)
GFR: 77.53 mL/min (ref >60.00)
Glucose, Bld: 98 mg/dL (ref 70–99)
POTASSIUM: 4.8 meq/L (ref 3.5–5.1)
SODIUM: 137 meq/L (ref 135–145)
TOTAL PROTEIN: 7.3 g/dL (ref 6.0–8.3)
Total Bilirubin: 0.8 mg/dL (ref 0.2–1.2)

## 2017-09-18 LAB — LDL CHOLESTEROL, DIRECT: LDL DIRECT: 181 mg/dL

## 2017-09-18 LAB — TSH: TSH: 3.59 u[IU]/mL (ref 0.35–4.50)

## 2017-09-18 MED ORDER — ALBUTEROL SULFATE HFA 108 (90 BASE) MCG/ACT IN AERS: INHALATION_SPRAY | RESPIRATORY_TRACT | 4 refills | Status: DC

## 2017-09-18 MED ORDER — ZOLPIDEM TARTRATE 10 MG PO TABS: ORAL_TABLET | ORAL | 0 refills | Status: DC

## 2017-09-18 MED ORDER — ROSUVASTATIN CALCIUM 10 MG PO TABS: 10.0000 mg | ORAL_TABLET | Freq: Every day | ORAL | 2 refills | Status: DC

## 2017-09-18 NOTE — Patient Instructions (Addendum)
DASH Eating Plan DASH stands for "Dietary Approaches to Stop Hypertension." The DASH eating plan is a healthy eating plan that has been shown to reduce high blood pressure (hypertension). It may also reduce your risk for type 2 diabetes, heart disease, and stroke. The DASH eating plan may also help with weight loss. What are tips for following this plan? General guidelines  Avoid eating more than 2,300 mg (milligrams) of salt (sodium) a day. If you have hypertension, you may need to reduce your sodium intake to 1,500 mg a day.  Limit alcohol intake to no more than 1 drink a day for nonpregnant women and 2 drinks a day for men. One drink equals 12 oz of beer, 5 oz of wine, or 1 oz of hard liquor.  Work with your health care provider to maintain a healthy body weight or to lose weight. Ask what an ideal weight is for you.  Get at least 30 minutes of exercise that causes your heart to beat faster (aerobic exercise) most days of the week. Activities may include walking, swimming, or biking.  Work with your health care provider or diet and nutrition specialist (dietitian) to adjust your eating plan to your individual calorie needs. Reading food labels  Check food labels for the amount of sodium per serving. Choose foods with less than 5 percent of the Daily Value of sodium. Generally, foods with less than 300 mg of sodium per serving fit into this eating plan.  To find whole grains, look for the word "whole" as the first word in the ingredient list. Shopping  Buy products labeled as "low-sodium" or "no salt added."  Buy fresh foods. Avoid canned foods and premade or frozen meals. Cooking  Avoid adding salt when cooking. Use salt-free seasonings or herbs instead of table salt or sea salt. Check with your health care provider or pharmacist before using salt substitutes.  Do not fry foods. Cook foods using healthy methods such as baking, boiling, grilling, and broiling instead.  Cook with  heart-healthy oils, such as olive, canola, soybean, or sunflower oil. Meal planning   Eat a balanced diet that includes: ? 5 or more servings of fruits and vegetables each day. At each meal, try to fill half of your plate with fruits and vegetables. ? Up to 6-8 servings of whole grains each day. ? Less than 6 oz of lean meat, poultry, or fish each day. A 3-oz serving of meat is about the same size as a deck of cards. One egg equals 1 oz. ? 2 servings of low-fat dairy each day. ? A serving of nuts, seeds, or beans 5 times each week. ? Heart-healthy fats. Healthy fats called Omega-3 fatty acids are found in foods such as flaxseeds and coldwater fish, like sardines, salmon, and mackerel.  Limit how much you eat of the following: ? Canned or prepackaged foods. ? Food that is high in trans fat, such as fried foods. ? Food that is high in saturated fat, such as fatty meat. ? Sweets, desserts, sugary drinks, and other foods with added sugar. ? Full-fat dairy products.  Do not salt foods before eating.  Try to eat at least 2 vegetarian meals each week.  Eat more home-cooked food and less restaurant, buffet, and fast food.  When eating at a restaurant, ask that your food be prepared with less salt or no salt, if possible. What foods are recommended? The items listed may not be a complete list. Talk with your dietitian about what   dietary choices are best for you. Grains Whole-grain or whole-wheat bread. Whole-grain or whole-wheat pasta. Brown rice. Oatmeal. Quinoa. Bulgur. Whole-grain and low-sodium cereals. Pita bread. Low-fat, low-sodium crackers. Whole-wheat flour tortillas. Vegetables Fresh or frozen vegetables (raw, steamed, roasted, or grilled). Low-sodium or reduced-sodium tomato and vegetable juice. Low-sodium or reduced-sodium tomato sauce and tomato paste. Low-sodium or reduced-sodium canned vegetables. Fruits All fresh, dried, or frozen fruit. Canned fruit in natural juice (without  added sugar). Meat and other protein foods Skinless chicken or turkey. Ground chicken or turkey. Pork with fat trimmed off. Fish and seafood. Egg whites. Dried beans, peas, or lentils. Unsalted nuts, nut butters, and seeds. Unsalted canned beans. Lean cuts of beef with fat trimmed off. Low-sodium, lean deli meat. Dairy Low-fat (1%) or fat-free (skim) milk. Fat-free, low-fat, or reduced-fat cheeses. Nonfat, low-sodium ricotta or cottage cheese. Low-fat or nonfat yogurt. Low-fat, low-sodium cheese. Fats and oils Soft margarine without trans fats. Vegetable oil. Low-fat, reduced-fat, or light mayonnaise and salad dressings (reduced-sodium). Canola, safflower, olive, soybean, and sunflower oils. Avocado. Seasoning and other foods Herbs. Spices. Seasoning mixes without salt. Unsalted popcorn and pretzels. Fat-free sweets. What foods are not recommended? The items listed may not be a complete list. Talk with your dietitian about what dietary choices are best for you. Grains Baked goods made with fat, such as croissants, muffins, or some breads. Dry pasta or rice meal packs. Vegetables Creamed or fried vegetables. Vegetables in a cheese sauce. Regular canned vegetables (not low-sodium or reduced-sodium). Regular canned tomato sauce and paste (not low-sodium or reduced-sodium). Regular tomato and vegetable juice (not low-sodium or reduced-sodium). Pickles. Olives. Fruits Canned fruit in a light or heavy syrup. Fried fruit. Fruit in cream or butter sauce. Meat and other protein foods Fatty cuts of meat. Ribs. Fried meat. Bacon. Sausage. Bologna and other processed lunch meats. Salami. Fatback. Hotdogs. Bratwurst. Salted nuts and seeds. Canned beans with added salt. Canned or smoked fish. Whole eggs or egg yolks. Chicken or turkey with skin. Dairy Whole or 2% milk, cream, and half-and-half. Whole or full-fat cream cheese. Whole-fat or sweetened yogurt. Full-fat cheese. Nondairy creamers. Whipped toppings.  Processed cheese and cheese spreads. Fats and oils Butter. Stick margarine. Lard. Shortening. Ghee. Bacon fat. Tropical oils, such as coconut, palm kernel, or palm oil. Seasoning and other foods Salted popcorn and pretzels. Onion salt, garlic salt, seasoned salt, table salt, and sea salt. Worcestershire sauce. Tartar sauce. Barbecue sauce. Teriyaki sauce. Soy sauce, including reduced-sodium. Steak sauce. Canned and packaged gravies. Fish sauce. Oyster sauce. Cocktail sauce. Horseradish that you find on the shelf. Ketchup. Mustard. Meat flavorings and tenderizers. Bouillon cubes. Hot sauce and Tabasco sauce. Premade or packaged marinades. Premade or packaged taco seasonings. Relishes. Regular salad dressings. Where to find more information:  National Heart, Lung, and Blood Institute: www.nhlbi.nih.gov  American Heart Association: www.heart.org Summary  The DASH eating plan is a healthy eating plan that has been shown to reduce high blood pressure (hypertension). It may also reduce your risk for type 2 diabetes, heart disease, and stroke.  With the DASH eating plan, you should limit salt (sodium) intake to 2,300 mg a day. If you have hypertension, you may need to reduce your sodium intake to 1,500 mg a day.  When on the DASH eating plan, aim to eat more fresh fruits and vegetables, whole grains, lean proteins, low-fat dairy, and heart-healthy fats.  Work with your health care provider or diet and nutrition specialist (dietitian) to adjust your eating plan to your individual   calorie needs.   Please see your eye doctor yearly   Please check your blood pressure on a regular basis.  If it is consistently greater than 140/80, please make an office appointment.  You need to lose weight.  Consider a lower calorie diet and regular exercise.

## 2017-09-18 NOTE — Progress Notes (Signed)
Subjective:    Patient ID: Jason Weaver, male    DOB: Aug 30, 1956, 61 y.o.   MRN: [id number]  HPI 61 year old patient who is seen today for an annual exam.  He has a history of pulmonary sarcoidosis which has been fairly stable.  He generally takes and at bedtime albuterol dose to prevent nocturnal wheezing.  He has a history of OSA but presently off CPAP.  He has a prior history of left-sided hearing loss and has seen ENT in the past  He does have a history of colonic polyps.  Last colonoscopy 2016. He has a history of dyslipidemia and statin intolerance  Family history father died recently at 43 complications of COPD.  Mother in reasonably good health at 52 1 brother with alcoholism.  One sister with breast cancer.  Past Medical History:  Diagnosis Date  . Diverticulosis of colon   . ED (erectile dysfunction)   . Gout   . Hearing loss   . Hx of colonic polyps   . Hyperlipidemia   . Nasal septal deviation   . Obesity   . Pulmonary sarcoidosis ([place])  1997    biopsy proven     Social History   Socioeconomic History  . Marital status: Married    Spouse name: Not on file  . Number of children: Not on file  . Years of education: Not on file  . Highest education level: Not on file  Social Needs  . Financial resource strain: Not on file  . Food insecurity - worry: Not on file  . Food insecurity - inability: Not on file  . Transportation needs - medical: Not on file  . Transportation needs - non-medical: Not on file  Occupational History  . Occupation: Herbalist: [employer]  Tobacco Use  . Smoking status: Never Smoker  . Smokeless tobacco: Never Used  Substance and Sexual Activity  . Alcohol use: Yes    Alcohol/week: 0.0 oz  . Drug use: Not on file  . Sexual activity: Not on file  Other Topics Concern  . Not on file  Social History Narrative  . Not on file    Past Surgical History:  Procedure Laterality Date  . CATARACT EXTRACTION    .  HERNIA REPAIR  2009  . LUNG BIOPSY  1997  . TONSILLECTOMY  1979    Family History  Problem Relation Age of Onset  . COPD Father        was a smoker  . Arthritis Father   . Asthma Sister   . Cancer Sister     Allergies  Allergen Reactions  . Ciprofloxacin Rash    Current Outpatient Medications on File Prior to Visit  Medication Sig Dispense Refill  . Ascorbic Acid (VITAMIN C) 500 MG tablet Take 500 mg by mouth daily.      . diphenhydrAMINE (BENADRYL) 25 MG tablet Take 25 mg by mouth every 6 (six) hours as needed for allergies.    . hydrocortisone (ANUSOL-HC) 2.5 % rectal cream Place rectally 2 (two) times daily. 30 g 3  . PROAIR HFA 108 (90 Base) MCG/ACT inhaler INHALE 2 PUFFS INTO THE LUNGS EVERY 6 (SIX) HOURS AS NEEDED FOR WHEEZING. 8.5 each 4  . STENDRA 200 MG TABS TAKE ONE-HALF TO ONE TABLET BY MOUTH AS NEEDED 4 tablet 0  . ULORIC 80 MG TABS   1  . valACYclovir (VALTREX) 1000 MG tablet Take 1 g by mouth as directed. Take 1 tablet  daily for 3-4 days as needed for flare ups  2  . zolpidem (AMBIEN) 10 MG tablet TAKE ONE TABLET BY MOUTH AT BEDTIME IF NEEDED FOR SLEEP 30 tablet 0  . ezetimibe (ZETIA) 10 MG tablet Take 1 tablet (10 mg total) by mouth daily. (Patient not taking: Reported on 09/18/2017) 90 tablet 3   No current facility-administered medications on file prior to visit.     BP 138/80 (BP Location: Left Arm, Patient Position: Sitting, Cuff Size: Normal)   Pulse 80   Temp 98.2 F (36.8 C) (Oral)   Ht 6' (1.829 m)   Wt 240 lb 12.8 oz (109.2 kg)   SpO2 97%   BMI 32.66 kg/m    Review of Systems  Constitutional: Negative for activity change, appetite change, chills, fatigue and fever.  HENT: Positive for hearing loss. Negative for congestion, dental problem, ear pain, mouth sores, rhinorrhea, sinus pressure, sneezing, tinnitus, trouble swallowing and voice change.   Eyes: Negative for photophobia, pain, redness and visual disturbance.  Respiratory: Negative for  apnea, cough, choking, chest tightness, shortness of breath and wheezing.   Cardiovascular: Negative for chest pain, palpitations and leg swelling.  Gastrointestinal: Negative for abdominal distention, abdominal pain, anal bleeding, blood in stool, constipation, diarrhea, nausea, rectal pain and vomiting.  Genitourinary: Negative for decreased urine volume, difficulty urinating, discharge, dysuria, flank pain, frequency, genital sores, hematuria, penile swelling, scrotal swelling, testicular pain and urgency.  Musculoskeletal: Negative for arthralgias, back pain, gait problem, joint swelling, myalgias, neck pain and neck stiffness.  Skin: Negative for color change, rash and wound.  Neurological: Positive for light-headedness. Negative for dizziness, tremors, seizures, syncope, facial asymmetry, speech difficulty, weakness, numbness and headaches.  Hematological: Negative for adenopathy. Does not bruise/bleed easily.  Psychiatric/Behavioral: Negative for agitation, behavioral problems, confusion, decreased concentration, dysphoric mood, hallucinations, self-injury, sleep disturbance and suicidal ideas. The patient is not nervous/anxious.        Objective:   Physical Exam  Constitutional: He is oriented to person, place, and time. He appears well-developed.  HENT:  Head: Normocephalic.  Right Ear: External ear normal.  Left Ear: External ear normal.  Hearing loss left ear  Eyes: Conjunctivae and EOM are normal.  Neck: Normal range of motion.  Cardiovascular: Normal rate and normal heart sounds.  Pulmonary/Chest: Breath sounds normal.  Abdominal: Bowel sounds are normal.  Genitourinary: Prostate normal and penis normal. Rectal exam shows guaiac negative stool.  Musculoskeletal: Normal range of motion. He exhibits no edema or tenderness.  Neurological: He is alert and oriented to person, place, and time.  Psychiatric: He has a normal mood and affect. His behavior is normal.            Assessment & Plan:   Preventive health examination Pulmonary sarcoidosis OSA Left-sided sensorineural hearing loss History of colonic polyps Status post cataract surgery.  Annual examination recommended Obesity.  Weight loss encouraged.  Will place on a DASH diet Hypercholesterolemia.  Placed on Crestor 10 mg twice weekly  Review screening lab  [facility]

## 2017-09-19 LAB — HEPATITIS C ANTIBODY
Hepatitis C Ab: NONREACTIVE
SIGNAL TO CUT-OFF: 0.04 (ref <1.00)

## 2017-10-11 ENCOUNTER — Ambulatory Visit: Payer: Self-pay

## 2017-10-11 ENCOUNTER — Ambulatory Visit (INDEPENDENT_AMBULATORY_CARE_PROVIDER_SITE_OTHER): Payer: BLUE CROSS/BLUE SHIELD

## 2017-10-11 ENCOUNTER — Encounter: Payer: Self-pay | Admitting: Sports Medicine

## 2017-10-11 ENCOUNTER — Ambulatory Visit: Payer: BLUE CROSS/BLUE SHIELD | Admitting: Sports Medicine

## 2017-10-11 VITALS — BP 140/98 | HR 74 | Ht 72.0 in | Wt 241.4 lb

## 2017-10-11 DIAGNOSIS — M19072 Primary osteoarthritis, left ankle and foot: Secondary | ICD-10-CM

## 2017-10-11 DIAGNOSIS — M25572 Pain in left ankle and joints of left foot: Secondary | ICD-10-CM

## 2017-10-11 DIAGNOSIS — G8929 Other chronic pain: Secondary | ICD-10-CM

## 2017-10-11 NOTE — Assessment & Plan Note (Signed)
Can consider evaluation for ankle replacement with Dr. due to We will get him set up with custom [facility] Orthotics given the significant changes of the midfoot and longitudinal arch.  Ankle arthritis should be helped by this as well.

## 2017-10-11 NOTE — Procedures (Signed)
PROCEDURE NOTE -  ULTRASOUND GUIDEDInjection: Left ankle Images were obtained and interpreted by myself, Teresa Coombs, DO  Images have been saved and stored to PACS system. Images obtained on: GE S7 Ultrasound machine  ULTRASOUND FINDINGS:  Degenerative spurring, moderate effusion  DESCRIPTION OF PROCEDURE:  The patient's clinical condition is marked by substantial pain and/or significant functional disability. Other conservative therapy has not provided relief, is contraindicated, or not appropriate. There is a reasonable likelihood that injection will significantly improve the patient's pain and/or functional impairment.  After discussing the risks, benefits and expected outcomes of the injection and all questions were reviewed and answered, the patient wished to undergo the above named procedure. Verbal consent was obtained.  The ultrasound was used to identify the target structure and adjacent neurovascular structures. The skin was then prepped in sterile fashion and the target structure was injected under direct visualization using sterile technique as below:  Left PREP: Alcohol, Ethel Chloride,   APPROACH: anteriomedial, single injection, 25g 1.5in. INJECTATE: 2 cc total of a solution of 1cc 1% lidocaine, 1cc 0.5% marcaine, 1cc 80mg /mL DepoMedrol  ASPIRATE: N/A DRESSING: Band-Aid    Post procedural instructions including recommending icing and warning signs for infection were reviewed.  This procedure was well tolerated and there were no complications.   IMPRESSION: Succesful US Guided Injection

## 2017-10-11 NOTE — Patient Instructions (Signed)

## 2017-10-11 NOTE — Progress Notes (Signed)
Jason Weaver. Jason Weaver, [facility] [place]  Harnoor Kohles Weaver - 61 y.o. male MRN [id number]  Date of birth: 12-29-1955  Visit Date: 10/11/2017  PCP: Marletta Lor, MD   Referred by: Marletta Lor, MD   Scribe for today's visit: Wendy Poet, ATC    SUBJECTIVE:  Jason Weaver is here for Initial Assessment (Ankle sprain) .   His L ankle pain and swelling symptoms INITIALLY: Began years ago but feels like he irritated his L ankle when he went hiking in Aug. 2017. Described as 8/10, sharp pain, nonradiating Worsened with weight bearing and L ankle eversion Improved with rest, non-weightbearing activity, Biofreeze and Advil Additional associated symptoms include: no N/T noted into the L foot    At this time symptoms are worsening compared to onset with increased pain and swelling. He has been using Biofreeze and wears a compression wrap and takes IBU intermittently.  Has seen an ortho who wants to fuse it [facility]) and has had PT in the past (about 2 years prior).   ROS Denies night time disturbances. Denies fevers, chills, or night sweats. Denies unexplained weight loss. Denies personal history of cancer. Denies changes in bowel or bladder habits. Denies recent unreported falls. Reports new or worsening dyspnea or wheezing. Yes to wheezing.  Has sarcodosis in his lungs. Denies headaches or dizziness.  Denies numbness, tingling or weakness  In the extremities.  Denies dizziness or presyncopal episodes Denies lower extremity edema     HISTORY & PERTINENT PRIOR DATA:  Prior History reviewed and updated per electronic medical record.  Significant history, findings, studies and interim changes include:  reports that  has never smoked. he has never used smokeless tobacco. No results for input(s): HGBA1C, LABURIC, CREATINE in the last 8760 hours. No specialty comments available. Problem  Arthritis of  Ankle Or Foot, Degenerative, Left   Marked degenerative change of the ankle. Injection 10/11/2017 Using Body Helix, has been recommended for a ankle fusion by Dr. Doran Durand      OBJECTIVE:  VS:  HT:6' (182.9 cm)   WT:241 lb 6.4 oz (109.5 kg)  BMI:32.73    BP:(!) 140/98  HR:74bpm  TEMP: ( )  RESP:97 %  PHYSICAL EXAM: Constitutional: WDWN, Non-toxic appearing. Psychiatric: Alert & appropriately interactive. Not depressed or anxious appearing. Respiratory: No increased work of breathing. Trachea Midline Eyes: Pupils are equal. EOM intact without nystagmus. No scleral icterus Cardiovascular:  Peripheral Pulses: peripheral pulses symmetrical No clubbing or cyanosis appreciated Capillary Refill is normal, less than 2 seconds No signficant generalized edema/anasarca Sensory Exam: intact to light touch  Bilateral feet: Significant breakdown of the transverse arch with splay toe between first and second toes bilaterally.  He has clawing of the second and third toes bilaterally right worse than left.  He has longitudinal arch breakdown as well.  He has significant osteoarthritic bossing of the left ankle.  Range of motion is from 40degrees to 95 degrees.  Plantar flexion, dorsiflexion, inversion and eversion strength is 5 out of 5.  Ankle drawer exam stable  ASSESSMENT & PLAN:   1. Chronic pain of left ankle   2. Arthritis of ankle or foot, degenerative, left    PLAN:    Arthritis of ankle or foot, degenerative, left Can consider evaluation for ankle replacement with Dr. due to We will get him set up with custom [facility] Orthotics given the significant changes of the midfoot and longitudinal arch.  Ankle arthritis should be helped by this as well.    ++++++++++++++++++++++++++++++++++++++++++++ Orders & Meds: Orders Placed This Encounter  Procedures  . DG Ankle Complete Left  . US GUIDED NEEDLE PLACEMENT(NO LINKED CHARGES)    No orders of the defined types were placed in this  encounter.   ++++++++++++++++++++++++++++++++++++++++++++ Follow-up: Return if symptoms worsen or fail to improve, for orthotics appt..   Pertinent documentation may be included in additional procedure notes, imaging studies, problem based documentation and patient instructions. Please see these sections of the encounter for additional information regarding this visit. CMA/ATC served as Education administrator during this visit. History, Physical, and Plan performed by medical provider. Documentation and orders reviewed and attested to.      [name], [place] [facility] Physician

## 2017-10-17 ENCOUNTER — Ambulatory Visit: Payer: BLUE CROSS/BLUE SHIELD | Admitting: Sports Medicine

## 2017-10-17 ENCOUNTER — Encounter: Payer: Self-pay | Admitting: Sports Medicine

## 2017-10-17 VITALS — BP 130/84 | HR 73 | Ht 72.0 in | Wt 239.0 lb

## 2017-10-17 DIAGNOSIS — M19072 Primary osteoarthritis, left ankle and foot: Secondary | ICD-10-CM

## 2017-10-17 DIAGNOSIS — M25572 Pain in left ankle and joints of left foot: Secondary | ICD-10-CM

## 2017-10-17 DIAGNOSIS — R269 Unspecified abnormalities of gait and mobility: Secondary | ICD-10-CM

## 2017-10-17 DIAGNOSIS — G8929 Other chronic pain: Secondary | ICD-10-CM

## 2017-10-17 NOTE — Patient Instructions (Addendum)

## 2017-10-17 NOTE — Progress Notes (Signed)
mri Juanda Bond. [name], [place] [facility] [place]  Mohmmad Saleeby Bilski - 61 y.o. male MRN [id number]  Date of birth: [birth year]  Visit Date: 10/17/2017  PCP: Marletta Lor, MD   Referred by: Marletta Lor, MD   Scribe for today's visit: Josepha Pigg, CMA    SUBJECTIVE:  Jason Weaver is here for Follow-up (LT ankle pain)   Compared to the last office visit, his previously described symptoms are improving but he continues to have pain in the ankle after receiving injection at his last visit. He denies increased warmth or erythema around the injection site.  Current symptoms are moderate & are nonradiating He has been using compression and taking Ibuprofen prn for pain.   ROS Denies night time disturbances. Denies fevers, chills, or night sweats. Denies unexplained weight loss. Denies personal history of cancer. Denies changes in bowel or bladder habits. Denies recent unreported falls. Reports new or worsening dyspnea or wheezing. Denies headaches or dizziness.  Denies numbness, tingling or weakness  In the extremities.  Denies dizziness or presyncopal episodes Denies lower extremity edema     HISTORY & PERTINENT PRIOR DATA:  Prior History reviewed and updated per electronic medical record.  Significant history, findings, studies and interim changes include:  reports that  has never smoked. he has never used smokeless tobacco. No results for input(s): HGBA1C, LABURIC, CREATINE in the last 8760 hours. No specialty comments available. No problems updated.   OBJECTIVE:  VS:  HT:6' (182.9 cm)   WT:239 lb (108.4 kg)  BMI:32.41    BP:130/84  HR:73bpm  TEMP: ( )  RESP:97 %   PHYSICAL EXAM: Constitutional: WDWN, Non-toxic appearing. Psychiatric: Alert & appropriately interactive. Not depressed or anxious appearing. Respiratory: No increased work of breathing. Trachea Midline Eyes: Pupils are equal. EOM  intact without nystagmus. No scleral icterus Cardiovascular:  Peripheral Pulses: peripheral pulses symmetrical No clubbing or cyanosis appreciated Capillary Refill is normal, less than 2 seconds No signficant generalized edema/anasarca Sensory Exam: intact to light touch   Significant breakdown of the transverse arch with splay toe between first and second toes bilaterally.  He has clawing of the second and third toes bilaterally right worse than left.  He has longitudinal arch breakdown as well.  He has significant osteoarthritic bossing of the left ankle.  Range of motion is from 40degrees to 95 degrees.  Plantar flexion, dorsiflexion, inversion and eversion strength is 5 out of 5.  Ankle drawer exam stable  No additional findings.   ASSESSMENT & PLAN:   1. Arthritis of ankle or foot, degenerative, left   2. Chronic pain of left ankle   3. Gait disturbance    PLAN: Custom insoles fabricated today per procedure note.  No charge office visit follow-up for clinical reevaluation as scheduled.   ++++++++++++++++++++++++++++++++++++++++++++ Orders & Meds: Orders Placed This Encounter  Procedures  . Misc procedure    No orders of the defined types were placed in this encounter.   ++++++++++++++++++++++++++++++++++++++++++++ Follow-up: No Follow-up on file.   Pertinent documentation may be included in additional procedure notes, imaging studies, problem based documentation and patient instructions. Please see these sections of the encounter for additional information regarding this visit. CMA/ATC served as Education administrator during this visit. History, Physical, and Plan performed by medical provider. Documentation and orders reviewed and attested to.      [name], [place] [facility] Physician

## 2017-10-17 NOTE — Procedures (Signed)
PROCEDURE: CUSTOM ORTHOTIC FABRICATION Patient's underlying musculoskeletal conditions are directly related to poor biomechanics and will benefit from a functional custom orthotic.  There are no significant foot deformities that complicate the use of a custom orthotic.  The patient was fitted for a standard, cushioned, semi-rigid orthotic. The orthotic was heated & placed on the orthotic stand. The patient was positioned in subtalar neutral position and 10 of ankle dorsiflexion and weight bearing stance on the heated orthotic blank. After completion of the molding a base was applied to the orthotic blank. The orthotic was ground to a stable position for weightbearing. The patient ambulated in these and reported they were comfortable without pressure spots.              BLANK:  Size 13 - Standard Cushioned                 BASE:  Blue EVA      POSTINGS:  n/a

## 2017-10-21 DIAGNOSIS — L570 Actinic keratosis: Secondary | ICD-10-CM | POA: Diagnosis not present

## 2017-10-21 DIAGNOSIS — D045 Carcinoma in situ of skin of trunk: Secondary | ICD-10-CM | POA: Diagnosis not present

## 2017-10-21 DIAGNOSIS — L218 Other seborrheic dermatitis: Secondary | ICD-10-CM | POA: Diagnosis not present

## 2017-10-21 DIAGNOSIS — L821 Other seborrheic keratosis: Secondary | ICD-10-CM | POA: Diagnosis not present

## 2017-10-21 DIAGNOSIS — L820 Inflamed seborrheic keratosis: Secondary | ICD-10-CM | POA: Diagnosis not present

## 2017-10-31 ENCOUNTER — Other Ambulatory Visit: Payer: Self-pay | Admitting: Internal Medicine

## 2017-10-31 ENCOUNTER — Telehealth: Payer: Self-pay | Admitting: Internal Medicine

## 2017-10-31 MED ORDER — SULFAMETHOXAZOLE-TRIMETHOPRIM 800-160 MG PO TABS: 1.0000 | ORAL_TABLET | Freq: Two times a day (BID) | ORAL | 0 refills | Status: DC

## 2017-10-31 MED ORDER — METRONIDAZOLE 500 MG PO TABS: 500.0000 mg | ORAL_TABLET | Freq: Three times a day (TID) | ORAL | 0 refills | Status: DC

## 2017-10-31 MED ORDER — CIPROFLOXACIN HCL 500 MG PO TABS: 500.0000 mg | ORAL_TABLET | Freq: Two times a day (BID) | ORAL | 0 refills | Status: DC

## 2017-10-31 NOTE — Telephone Encounter (Signed)
Pt notified that two prescriptions have been sent to the pharmacy.  Called [facility] and cancelled Cipro per Dr. Raliegh Ip.  No further action required.  Will close note.

## 2017-10-31 NOTE — Telephone Encounter (Signed)
Spoke to the pt and he informed me that he is having a diverticulosis flare.  He states he is having severe abdominal pain and diarrhea. Denied fever, nausea and vomiting.  He stated he felt feverish on Saturday and was sweating. Would like Dr. Raliegh Ip to send in medication without an office visit but will come in if needed. Please advise.

## 2017-10-31 NOTE — Telephone Encounter (Signed)
Copied from [place] [id number]. Topic: General - Other >> Oct 31, 2017  3:09 PM Neva Seat wrote: Pt is having diverticulitis attacks off and on since Sat. 10-26-17.  Pt was prescribed an antibiotic in the past that has helped.  Pt is requesting this Rx again.  Pleae call pt to let him know if this can be done.

## 2017-10-31 NOTE — Telephone Encounter (Signed)
Copied from [place] [id number]. Topic: Quick Communication - See Telephone Encounter >> Oct 31, 2017  2:42 PM Bea Graff, NT wrote: CRM for notification. See Telephone encounter for: Pt would like a call with his lab results.   10/31/17.

## 2017-11-21 ENCOUNTER — Ambulatory Visit (INDEPENDENT_AMBULATORY_CARE_PROVIDER_SITE_OTHER): Payer: BLUE CROSS/BLUE SHIELD | Admitting: Sports Medicine

## 2017-11-21 ENCOUNTER — Encounter: Payer: Self-pay | Admitting: Sports Medicine

## 2017-11-21 VITALS — BP 128/82 | HR 64 | Ht 72.0 in | Wt 237.6 lb

## 2017-11-21 DIAGNOSIS — M25572 Pain in left ankle and joints of left foot: Secondary | ICD-10-CM | POA: Diagnosis not present

## 2017-11-21 DIAGNOSIS — R269 Unspecified abnormalities of gait and mobility: Secondary | ICD-10-CM

## 2017-11-21 DIAGNOSIS — M19072 Primary osteoarthritis, left ankle and foot: Secondary | ICD-10-CM | POA: Diagnosis not present

## 2017-11-21 DIAGNOSIS — G8929 Other chronic pain: Secondary | ICD-10-CM

## 2017-11-21 NOTE — Progress Notes (Signed)
Jason Weaver, Jason Weaver  Jason Weaver - 62 y.o. male MRN [id number]  Date of birth: [birth year]  Visit Date: 11/21/2017  PCP: Marletta Lor, MD   Referred by: Marletta Lor, MD   Scribe for today's visit: Wendy Poet, LAT, ATC     SUBJECTIVE:  Jason Weaver is here for Follow-up (L ankle pain) .   Compared to the last office visit on 10/17/17, his previously described L ankle pain symptoms are improving w/ less pain.  Still has some stiffness.  It's the best his ankle has felt in over a year but he wasn't able to ski. Current symptoms are mild & are nonradiating He has been using his orthotics in his running and golf shoes and likes them so far.   ROS Denies night time disturbances. Denies fevers, chills, or night sweats. Denies unexplained weight loss. Denies personal history of cancer. Denies changes in bowel or bladder habits. Denies recent unreported falls. Reports new or worsening dyspnea or wheezing.  Yes due to a recent cold. Denies headaches or dizziness.  Denies numbness, tingling or weakness  In the extremities.  Denies dizziness or presyncopal episodes Denies lower extremity edema     HISTORY & PERTINENT PRIOR DATA:  Prior History reviewed and updated per electronic medical record.  Significant history, findings, studies and interim changes include:  reports that  has never smoked. he has never used smokeless tobacco. No results for input(s): HGBA1C, LABURIC, CREATINE in the last 8760 hours. No specialty comments available. Problem  Arthritis of Ankle Or Foot, Degenerative, Left   Marked degenerative change of the ankle. Injection 10/11/2017 Using Body Helix, has been recommended for a ankle fusion by Dr. Doran Durand Custom insoles fabricated at the end of December 2018   Sprain of Left Ankle, Initial Encounter    OBJECTIVE:  VS:  HT:6' (182.9 cm)   WT:237 lb 9.6 oz  (107.8 kg)  BMI:32.22    BP:128/82  HR:64bpm  TEMP: ( )  RESP:97 %   PHYSICAL EXAM: Constitutional: WDWN, Non-toxic appearing. Psychiatric: Alert & appropriately interactive.  Not depressed or anxious appearing. Respiratory: No increased work of breathing.  Trachea Midline Eyes: Pupils are equal.  EOM intact without nystagmus.  No scleral icterus  NEUROVASCULAR exam: No clubbing or cyanosis appreciated No significant venous stasis changes Capillary Refill: normal, less than 2 seconds   Left ankle has generalized osteophytic bossing with prominence over the left anterior lateral ankle.  He is ligamentously stable.  His dorsiflexion is to 95 degrees.  He has early claw toe deformities.  Ankle drawer is normal.  He has normal flexion-extension of the bilateral knees.  Walks with a normal gait. No additional findings.   ASSESSMENT & PLAN:   1. Arthritis of ankle or foot, degenerative, left   2. Chronic pain of left ankle   3. Gait disturbance    PLAN:  Arthritis of ankle or foot, degenerative, left He has done well following injection and custom cushioned insoles.  He was unable to return to skiing due to ongoing pain with loading of the joint but day-to-day is feeling significantly improved losing his ankle just walking around.  He is tolerating the orthotics well.  He does feel that they are making a significant difference and is interested in obtaining further pairs sometime down the road.  We will begin working on therapeutic exercises and we will plan to have him follow-up in  3 months to ensure clinical improvement.  Instructions for return for consideration of early repeat injection as indicated.  >50% of this 25 minute visit spent in direct patient counseling and/or coordination of care.  Discussion was focused on education regarding the in discussing the pathoetiology and anticipated clinical course of the above condition.  ++++++++++++++++++++++++++++++++++++++++++++ Orders &  Meds: No orders of the defined types were placed in this encounter.   No orders of the defined types were placed in this encounter.   ++++++++++++++++++++++++++++++++++++++++++++ Follow-up: Return in about 3 months (around 02/18/2018) for repeat clinical exam and will consider custom orthotics if yours begin to show wear.   Pertinent documentation may be included in additional procedure notes, imaging studies, problem based documentation and patient instructions. Please see these sections of the encounter for additional information regarding this visit. CMA/ATC served as Education administrator during this visit. History, Physical, and Plan performed by medical provider. Documentation and orders reviewed and attested to.      [name], [facility] Physician

## 2017-11-21 NOTE — Assessment & Plan Note (Signed)
He has done well following injection and custom cushioned insoles.  He was unable to return to skiing due to ongoing pain with loading of the joint but day-to-day is feeling significantly improved losing his ankle just walking around.  He is tolerating the orthotics well.  He does feel that they are making a significant difference and is interested in obtaining further pairs sometime down the road.  We will begin working on therapeutic exercises and we will plan to have him follow-up in 3 months to ensure clinical improvement.  Instructions for return for consideration of early repeat injection as indicated.

## 2018-01-01 DIAGNOSIS — D171 Benign lipomatous neoplasm of skin and subcutaneous tissue of trunk: Secondary | ICD-10-CM | POA: Diagnosis not present

## 2018-01-03 DIAGNOSIS — M79671 Pain in right foot: Secondary | ICD-10-CM | POA: Diagnosis not present

## 2018-01-03 DIAGNOSIS — M170 Bilateral primary osteoarthritis of knee: Secondary | ICD-10-CM | POA: Diagnosis not present

## 2018-01-03 DIAGNOSIS — M1A09X0 Idiopathic chronic gout, multiple sites, without tophus (tophi): Secondary | ICD-10-CM | POA: Diagnosis not present

## 2018-01-12 ENCOUNTER — Other Ambulatory Visit: Payer: Self-pay | Admitting: Internal Medicine

## 2018-01-16 DIAGNOSIS — M7989 Other specified soft tissue disorders: Secondary | ICD-10-CM | POA: Diagnosis not present

## 2018-01-16 DIAGNOSIS — D171 Benign lipomatous neoplasm of skin and subcutaneous tissue of trunk: Secondary | ICD-10-CM | POA: Diagnosis not present

## 2018-01-24 DIAGNOSIS — Z9889 Other specified postprocedural states: Secondary | ICD-10-CM | POA: Diagnosis not present

## 2018-02-21 ENCOUNTER — Ambulatory Visit: Payer: BLUE CROSS/BLUE SHIELD | Admitting: Sports Medicine

## 2018-02-25 ENCOUNTER — Ambulatory Visit: Payer: BLUE CROSS/BLUE SHIELD | Admitting: Sports Medicine

## 2018-03-07 ENCOUNTER — Encounter: Payer: Self-pay | Admitting: Sports Medicine

## 2018-03-07 ENCOUNTER — Ambulatory Visit: Payer: BLUE CROSS/BLUE SHIELD | Admitting: Sports Medicine

## 2018-03-07 VITALS — BP 118/80 | HR 77 | Ht 72.0 in | Wt 233.4 lb

## 2018-03-07 DIAGNOSIS — M25572 Pain in left ankle and joints of left foot: Secondary | ICD-10-CM | POA: Diagnosis not present

## 2018-03-07 DIAGNOSIS — G8929 Other chronic pain: Secondary | ICD-10-CM

## 2018-03-07 DIAGNOSIS — R269 Unspecified abnormalities of gait and mobility: Secondary | ICD-10-CM | POA: Diagnosis not present

## 2018-03-07 DIAGNOSIS — M19072 Primary osteoarthritis, left ankle and foot: Secondary | ICD-10-CM | POA: Diagnosis not present

## 2018-03-07 NOTE — Progress Notes (Signed)
Jason Weaver. Jason Weaver, [facility]  Jason Weaver - 62 y.o. male MRN [id number]  Date of birth: January 20, 1956  Visit Date: 03/07/2018  PCP: Marletta Lor, MD   Referred by: Marletta Lor, MD  Scribe for today's visit: Wendy Poet, LAT, ATC     SUBJECTIVE:  Rose Fillers Boxer is here for Follow-up (L ankle pain) .   11/21/2017: Compared to the last office visit on 10/17/17, his previously described L ankle pain symptoms are improving w/ less pain.  Still has some stiffness.  It's the best his ankle has felt in over a year but he wasn't able to ski. Current symptoms are mild & are nonradiating He has been using his orthotics in his running and golf shoes and likes them so far.  03/07/2018: Compared to the last office visit on 11/21/17, his previously described L ankle pain symptoms are worsening w/ increased pain and stiffness since this winter and his injection on 10/11/17. Current symptoms are moderate & are nonradiating He has been using his orthotics which he con't to feel are helping and is interested in possibly making another pair today if possible. He is no longer wearing his Body Helix compression sleeve.   ROS Denies night time disturbances. Denies fevers, chills, or night sweats. Denies unexplained weight loss. Denies personal history of cancer. Denies changes in bowel or bladder habits. Denies recent unreported falls. Denies new or worsening dyspnea or wheezing. Denies headaches or dizziness.  Denies numbness, tingling or weakness  In the extremities.  Denies dizziness or presyncopal episodes Denies lower extremity edema    HISTORY & PERTINENT PRIOR DATA:  Prior History reviewed and updated per electronic medical record.  Significant/pertinent history, findings, studies include:  reports that he has never smoked. He has never used smokeless tobacco. No results for input(s): HGBA1C, LABURIC,  CREATINE in the last 8760 hours. No specialty comments available. No problems updated.  OBJECTIVE:  VS:  HT:6' (182.9 cm)   WT:233 lb 6.4 oz (105.9 kg)  BMI:31.65    BP:118/80  HR:77bpm  TEMP: ( )  RESP:96 %   PHYSICAL EXAM: Constitutional: WDWN, Non-toxic appearing. Psychiatric: Alert & appropriately interactive.  Not depressed or anxious appearing. Respiratory: No increased work of breathing.  Trachea Midline Eyes: Pupils are equal.  EOM intact without nystagmus.  No scleral icterus  Vascular Exam: warm to touch no edema  lower extremity neuro exam: unremarkable normal strength normal sensation  MSK Exam: Ankle is a small amount of fluid on it today with some mild tenderness.  Good ankle dorsiflexion and plantarflexion.   ASSESSMENT & PLAN:   1. Arthritis of ankle or foot, degenerative, left   2. Chronic pain of left ankle   3. Gait disturbance     PLAN:  Overall he seems to be doing better with the orthotics although has intermittent symptoms and flareups.  He is taking anti-inflammatories on an intermittent basis.  We will plan to check in with him around 3 months and can consider repeating injection at any time in the interim.  If he is continuing to take anti-inflammatories lab work will be indicated at follow-up.   Follow-up: Return in about 3 months (around 06/07/2018).      Please see additional documentation for Objective, Assessment and Plan sections. Pertinent additional documentation may be included in corresponding procedure notes, imaging studies, problem based documentation and patient instructions. Please see these sections of the encounter for  additional information regarding this visit.  CMA/ATC served as Education administrator during this visit. History, Physical, and Plan performed by medical provider. Documentation and orders reviewed and attested to.      [name], [facility] Physician

## 2018-03-07 NOTE — Patient Instructions (Signed)
Look into CEP compression socks.  They make standard cut socks that are compression and would likely help control some of the swelling.    Let me know if you need me before August.

## 2018-05-05 ENCOUNTER — Ambulatory Visit: Payer: Self-pay | Admitting: *Deleted

## 2018-05-05 ENCOUNTER — Ambulatory Visit: Payer: BLUE CROSS/BLUE SHIELD | Admitting: Internal Medicine

## 2018-05-05 ENCOUNTER — Encounter: Payer: Self-pay | Admitting: Internal Medicine

## 2018-05-05 VITALS — BP 110/64 | HR 75 | Temp 98.2°F | Wt 234.8 lb

## 2018-05-05 DIAGNOSIS — E785 Hyperlipidemia, unspecified: Secondary | ICD-10-CM

## 2018-05-05 DIAGNOSIS — Z789 Other specified health status: Secondary | ICD-10-CM

## 2018-05-05 MED ORDER — EZETIMIBE 10 MG PO TABS: 10.0000 mg | ORAL_TABLET | Freq: Every day | ORAL | 4 refills | Status: DC

## 2018-05-05 MED ORDER — LANSOPRAZOLE 30 MG PO CPDR: 30.0000 mg | DELAYED_RELEASE_CAPSULE | Freq: Every day | ORAL | 3 refills | Status: DC

## 2018-05-05 NOTE — Telephone Encounter (Signed)
Pt called with feeling "pressure on his lungs" like he is in high altitude. He says maybe like short of breath, not sure.  He denies fever, or any cold symptoms. He feels this when he gets up in the morning from sleeping. And also has some lightheadedness. He has a hx of sarcoidosis and he does wheeze with this dx. Appointment scheduled per pt request. Flow notified of pt's triage and will route to them Advised to call back for worsening symptoms.  Reason for Disposition . [1] MILD difficulty breathing (e.g., minimal/no SOB at rest, SOB with walking, pulse <100) AND [2] NEW-onset or WORSE than normal  Answer Assessment - Initial Assessment Questions 1. RESPIRATORY STATUS: "Describe your breathing?" (e.g., wheezing, shortness of breath, unable to speak, severe coughing)      Maybe short of breath, like pressure on his chest 2. ONSET: "When did this breathing problem begin?"      A week 3. PATTERN "Does the difficult breathing come and go, or has it been constant since it started?"      At night and in the morning after awaken the first time 4. SEVERITY: "How bad is your breathing?" (e.g., mild, moderate, severe)    - MILD: No SOB at rest, mild SOB with walking, speaks normally in sentences, can lay down, no retractions, pulse < 100.    - MODERATE: SOB at rest, SOB with minimal exertion and prefers to sit, cannot lie down flat, speaks in phrases, mild retractions, audible wheezing, pulse 100-120.    - SEVERE: Very SOB at rest, speaks in single words, struggling to breathe, sitting hunched forward, retractions, pulse > 120      mild 5. RECURRENT SYMPTOM: "Have you had difficulty breathing before?" If so, ask: "When was the last time?" and "What happened that time?"      no 6. CARDIAC HISTORY: "Do you have any history of heart disease?" (e.g., heart attack, angina, bypass surgery, angioplasty)      no 7. LUNG HISTORY: "Do you have any history of lung disease?"  (e.g., pulmonary embolus, asthma,  emphysema)     Sarcoidosis. 8. CAUSE: "What do you think is causing the breathing problem?"      Not sure 9. OTHER SYMPTOMS: "Do you have any other symptoms? (e.g., dizziness, runny nose, cough, chest pain, fever)     Sometimes pain there  11. TRAVEL: "Have you traveled out of the country in the last month?" (e.g., travel history, exposures) no  Protocols used: BREATHING DIFFICULTY-A-AH

## 2018-05-05 NOTE — Telephone Encounter (Signed)
Noted  

## 2018-05-05 NOTE — Patient Instructions (Signed)
Avoids foods high in acid such as tomatoes citrus juices, and spicy foods.  Avoid eating within two hours of lying down or before exercising.  Do not overheat.  Try smaller more frequent meals.  If symptoms persist, elevate the head of her bed 12 inches while sleeping.  Return in 4 to 6 months for your annual exam

## 2018-05-05 NOTE — Progress Notes (Signed)
Subjective:    Patient ID: Jason Weaver, male    DOB: [birth year], 62 y.o.   MRN: [id number]  HPI  62 year old patient who presents with a one-week history of pressure in the chest area.  He has had this intermittently in the past.  He basically describes a pressure and tightness in the upper chest and neck area that usually occurs at night.  He denies any exertional symptoms and is quite active with golf workouts at the gym and snow skiing. He also states that at work last month he had a exercise stress test performed with normal results.  He does have a history of dyslipidemia and statin intolerance.  He was treated in the past for GERD.A  Past Medical History:  Diagnosis Date  . Diverticulosis of colon   . ED (erectile dysfunction)   . Gout   . Hearing loss   . Hx of colonic polyps   . Hyperlipidemia   . Nasal septal deviation   . Obesity   . 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 Marital status: Married    Spouse name: Not on file  . Number of children: Not on file  . Years of education: Not on file  . Highest education level: Not on file  Occupational History  . Occupation: Herbalist: [employer]  . Financial resource strain: Not on file  . Food insecurity:    Worry: Not on file    Inability: Not on file  . Transportation needs:    Medical: Not on file    Non-medical: Not on file  Tobacco Use  . Smoking status: Never Smoker  . Smokeless tobacco: Never Used  Substance and Sexual Activity  . Alcohol use: Yes    Alcohol/week: 0.0 oz  . Drug use: Not on file  . Sexual activity: Not on file  Lifestyle  . Physical activity:    Days per week: Not on file    Minutes per session: Not on file  . Stress: Not on file  Relationships  . Social connections:    Talks on phone: Not on file    Gets together: Not on file    Attends religious service: Not on file    Active  member of club or organization: Not on file    Attends meetings of clubs or organizations: Not on file    Relationship status: Not on file  . Intimate partner violence:    Fear of current or ex partner: Not on file    Emotionally abused: Not on file    Physically abused: Not on file    Forced sexual activity: Not on file  Other Topics Concern  . Not on file  Social History Narrative  . Not on file    Past Surgical History:  Procedure Laterality Date  . CATARACT EXTRACTION    . HERNIA REPAIR  2009  . LUNG BIOPSY  1997  . TONSILLECTOMY  1979    Family History  Problem Relation Age of Onset  . COPD Father        was a smoker  . Arthritis Father   . Asthma Sister   . Cancer Sister     Allergies  Allergen Reactions  . Ciprofloxacin Rash    Current Outpatient Medications on File Prior to Visit  Medication Sig Dispense Refill  . albuterol (PROAIR HFA) 108 (  90 Base) MCG/ACT inhaler INHALE 2 PUFFS INTO THE LUNGS EVERY 6 (SIX) HOURS AS NEEDED FOR WHEEZING. 8.5 each 4  . Ascorbic Acid (VITAMIN C) 500 MG tablet Take 500 mg by mouth daily.      . diphenhydrAMINE (BENADRYL) 25 MG tablet Take 25 mg by mouth every 6 (six) hours as needed for allergies.    . hydrocortisone (ANUSOL-HC) 2.5 % rectal cream Place rectally 2 (two) times daily. 30 g 3  . metroNIDAZOLE (FLAGYL) 500 MG tablet Take 1 tablet (500 mg total) by mouth 3 (three) times daily. 30 tablet 0  . rosuvastatin (CRESTOR) 10 MG tablet Take 1 tablet (10 mg total) by mouth daily. 90 tablet 2  . STENDRA 200 MG TABS TAKE ONE-HALF TO ONE TABLET BY MOUTH AS NEEDED 4 tablet 0  . sulfamethoxazole-trimethoprim (BACTRIM DS,SEPTRA DS) 800-160 MG tablet Take 1 tablet by mouth 2 (two) times daily. 20 tablet 0  . ULORIC 80 MG TABS   1  . valACYclovir (VALTREX) 1000 MG tablet Take 1 g by mouth as directed. Take 1 tablet daily for 3-4 days as needed for flare ups  2  . zolpidem (AMBIEN) 10 MG tablet TAKE ONE TABLET BY MOUTH AT BEDTIME IF  NEEDED FOR SLEEP 30 tablet 0   No current facility-administered medications on file prior to visit.     BP 110/64 (BP Location: Right Arm, Patient Position: Sitting, Cuff Size: Large)   Pulse 75   Temp 98.2 F (36.8 C) (Oral)   Wt 234 lb 12.8 oz (106.5 kg)   SpO2 98%   BMI 31.84 kg/m     Review of Systems  Constitutional: Negative for appetite change, chills, fatigue and fever.  HENT: Negative for congestion, dental problem, ear pain, hearing loss, sore throat, tinnitus, trouble swallowing and voice change.   Eyes: Negative for pain, discharge and visual disturbance.  Respiratory: Negative for cough, chest tightness, wheezing and stridor.   Cardiovascular: Positive for chest pain. Negative for palpitations and leg swelling.  Gastrointestinal: Negative for abdominal distention, abdominal pain, blood in stool, constipation, diarrhea, nausea and vomiting.  Genitourinary: Negative for difficulty urinating, discharge, flank pain, genital sores, hematuria and urgency.  Musculoskeletal: Negative for arthralgias, back pain, gait problem, joint swelling, myalgias and neck stiffness.  Skin: Negative for rash.  Neurological: Negative for dizziness, syncope, speech difficulty, weakness, numbness and headaches.  Hematological: Negative for adenopathy. Does not bruise/bleed easily.  Psychiatric/Behavioral: Negative for behavioral problems and dysphoric mood. The patient is not nervous/anxious.        Objective:   Physical Exam  Constitutional: He is oriented to person, place, and time. He appears well-developed.  HENT:  Head: Normocephalic.  Right Ear: External ear normal.  Left Ear: External ear normal.  Eyes: Conjunctivae and EOM are normal.  Neck: Normal range of motion.  Cardiovascular: Normal rate, normal heart sounds and intact distal pulses.  Pedal pulses full  Pulmonary/Chest: Breath sounds normal.  Abdominal: Bowel sounds are normal.  Musculoskeletal: Normal range of motion.  He exhibits no edema or tenderness.  Neurological: He is alert and oriented to person, place, and time.  Psychiatric: He has a normal mood and affect. His behavior is normal.          Assessment & Plan:   Atypical chest pain/nonexertional/history of negative ETT/ History of reflux.  Will give an empiric trial of PPI therapy for 30 days.  Antireflux regimen encouraged Dyslipidemia. Statin intolerance.  Will give a trial of Zetia  Return in  4 to 6 months for annual exam with new provider  Marletta Lor

## 2018-05-07 ENCOUNTER — Other Ambulatory Visit: Payer: Self-pay | Admitting: Internal Medicine

## 2018-05-07 DIAGNOSIS — Z0000 Encounter for general adult medical examination without abnormal findings: Secondary | ICD-10-CM

## 2018-06-06 ENCOUNTER — Ambulatory Visit: Payer: BLUE CROSS/BLUE SHIELD | Admitting: Sports Medicine

## 2018-06-06 ENCOUNTER — Encounter: Payer: Self-pay | Admitting: Sports Medicine

## 2018-06-06 ENCOUNTER — Ambulatory Visit: Payer: Self-pay

## 2018-06-06 VITALS — BP 130/82 | HR 67 | Ht 72.0 in | Wt 233.6 lb

## 2018-06-06 DIAGNOSIS — M1A09X0 Idiopathic chronic gout, multiple sites, without tophus (tophi): Secondary | ICD-10-CM | POA: Diagnosis not present

## 2018-06-06 DIAGNOSIS — G8929 Other chronic pain: Secondary | ICD-10-CM | POA: Diagnosis not present

## 2018-06-06 DIAGNOSIS — M25572 Pain in left ankle and joints of left foot: Secondary | ICD-10-CM | POA: Diagnosis not present

## 2018-06-06 DIAGNOSIS — M19072 Primary osteoarthritis, left ankle and foot: Secondary | ICD-10-CM

## 2018-06-06 NOTE — Patient Instructions (Signed)

## 2018-06-06 NOTE — Progress Notes (Signed)
Jason Weaver. Jason Weaver, [facility] at [place]  Jason Weaver - 62 y.o. male MRN [id number]  Date of birth: 1956/05/20  Visit Date: 06/06/2018  PCP: Marletta Lor, MD   Referred by: Marletta Lor, MD  Scribe(s) for today's visit: Wendy Poet, LAT, ATC  SUBJECTIVE:  Jason Weaver is here for Follow-up (L ankle pain) .    11/21/2017: Compared to the last office visit on 10/17/17, his previously described L ankle pain symptoms are improving w/ less pain.  Still has some stiffness.  It's the best his ankle has felt in over a year but he wasn't able to ski. Current symptoms are mild & are nonradiating He has been using his orthotics in his running and golf shoes and likes them so far.  03/07/2018: Compared to the last office visit on 11/21/17, his previously described L ankle pain symptoms are worsening w/ increased pain and stiffness since this winter and his injection on 10/11/17. Current symptoms are moderate & are nonradiating He has been using his orthotics which he con't to feel are helping and is interested in possibly making another pair today if possible. He is no longer wearing his Body Helix compression sleeve.  06/06/2018: Compared to the last office visit on 03/07/18, his previously described L ankle pain symptoms are equivocal and come and go depending on the day.  Majority of L ankle pain is lateral and feels a lot of tightness when DF and PFing his L ankle. Current symptoms are moderate & are nonradiating He has been wearing his custom orthotics.  He uses NSAIDs prn.  He's had a prior L ankle injection on 10/11/17 which did help and he would like another injection today.  L ankle XR - 10/11/17   REVIEW OF SYSTEMS: Denies night time disturbances. Denies fevers, chills, or night sweats. Denies unexplained weight loss. Denies personal history of cancer. Denies changes in bowel or bladder  habits. Denies recent unreported falls. Denies new or worsening dyspnea or wheezing. Denies headaches or dizziness.  Denies numbness, tingling or weakness  In the extremities.  Denies dizziness or presyncopal episodes Reports lower extremity edema - L lateral ankle   HISTORY & PERTINENT PRIOR DATA:  Prior History reviewed and updated per electronic medical record.  Significant/pertinent history, findings, studies include:  reports that he has never smoked. He has never used smokeless tobacco. No results for input(s): HGBA1C, LABURIC, CREATINE in the last 8760 hours. No specialty comments available. No problems updated.  OBJECTIVE:  VS:  HT:6' (182.9 cm)   WT:233 lb 9.6 oz (106 kg)  BMI:31.67    BP:130/82  HR:67bpm  TEMP: ( )  RESP:96 %   PHYSICAL EXAM: CONSTITUTIONAL: Well-developed, Well-nourished and In no acute distress Alert & appropriately interactive. and Not depressed or anxious appearing. Respiratory: No increased work of breathing.  Trachea Midline EYES: Pupils are equal., EOM intact without nystagmus. and No scleral icterus.  Lower EXTREMITY EXAM: Warm and well perfused NEURO: unremarkable  MSK Exam: Left ankle is slightly swollen with tenderness over the anterior lateral ankle.  He is stable at times.  Pain with talar tilting and Kleiger testing testing.  PROCEDURES & DATA REVIEWED:  See ultrasound note  ASSESSMENT   1. Arthritis of ankle or foot, degenerative, left   2. Chronic pain of left ankle   3. Idiopathic chronic gout of multiple sites without tophus     PLAN:  Repeat ultrasound injection performed today.  We did briefly discuss Uloric use and recommend follow-up with his rheumatologist to discuss ongoing management with this.  He is reported expensive since his discount coupon is no longer working.   Follow-up: Return if symptoms worsen or fail to improve.      Please see additional documentation for Objective, Assessment and Plan  sections. Pertinent additional documentation may be included in corresponding procedure notes, imaging studies, problem based documentation and patient instructions. Please see these sections of the encounter for additional information regarding this visit.  CMA/ATC served as Education administrator during this visit. History, Physical, and Plan performed by medical provider. Documentation and orders reviewed and attested to.      [name], [facility] Physician

## 2018-06-06 NOTE — Procedures (Signed)
PROCEDURE NOTE:  Ultrasound Guided: Injection: Left ankle Images were obtained and interpreted by myself, Teresa Coombs, DO  Images have been saved and stored to PACS system. Images obtained on: GE S7 Ultrasound machine    ULTRASOUND FINDINGS:  Degenerative changes.  Small effusion.  DESCRIPTION OF PROCEDURE:  The patient's clinical condition is marked by substantial pain and/or significant functional disability. Other conservative therapy has not provided relief, is contraindicated, or not appropriate. There is a reasonable likelihood that injection will significantly improve the patient's pain and/or functional impairment.   After discussing the risks, benefits and expected outcomes of the injection and all questions were reviewed and answered, the patient wished to undergo the above named procedure.  Verbal consent was obtained.  The ultrasound was used to identify the target structure and adjacent neurovascular structures. The skin was then prepped in sterile fashion and the target structure was injected under direct visualization using sterile technique as below:  Single injection performed as below: PREP: Alcohol and Ethel Chloride APPROACH:anteriolateral, single injection, 25g 1.5 in. INJECTATE: 1 cc 0.5% Marcaine and 1 cc 40mg /mL DepoMedrol ASPIRATE: None DRESSING: Band-Aid  Post procedural instructions including recommending icing and warning signs for infection were reviewed.    This procedure was well tolerated and there were no complications.   IMPRESSION: Succesful Ultrasound Guided: Injection

## 2018-07-21 DIAGNOSIS — L218 Other seborrheic dermatitis: Secondary | ICD-10-CM | POA: Diagnosis not present

## 2018-07-21 DIAGNOSIS — L738 Other specified follicular disorders: Secondary | ICD-10-CM | POA: Diagnosis not present

## 2018-09-03 DIAGNOSIS — L820 Inflamed seborrheic keratosis: Secondary | ICD-10-CM | POA: Diagnosis not present

## 2018-09-03 DIAGNOSIS — L821 Other seborrheic keratosis: Secondary | ICD-10-CM | POA: Diagnosis not present

## 2018-09-03 DIAGNOSIS — L218 Other seborrheic dermatitis: Secondary | ICD-10-CM | POA: Diagnosis not present

## 2018-09-03 DIAGNOSIS — D225 Melanocytic nevi of trunk: Secondary | ICD-10-CM | POA: Diagnosis not present

## 2018-09-05 ENCOUNTER — Encounter: Payer: Self-pay | Admitting: Sports Medicine

## 2018-09-05 ENCOUNTER — Ambulatory Visit: Payer: BLUE CROSS/BLUE SHIELD | Admitting: Sports Medicine

## 2018-09-05 VITALS — BP 134/86 | HR 71 | Ht 72.0 in | Wt 235.4 lb

## 2018-09-05 DIAGNOSIS — M19072 Primary osteoarthritis, left ankle and foot: Secondary | ICD-10-CM | POA: Diagnosis not present

## 2018-09-05 DIAGNOSIS — G8929 Other chronic pain: Secondary | ICD-10-CM | POA: Diagnosis not present

## 2018-09-05 DIAGNOSIS — M25572 Pain in left ankle and joints of left foot: Secondary | ICD-10-CM

## 2018-09-05 DIAGNOSIS — R269 Unspecified abnormalities of gait and mobility: Secondary | ICD-10-CM | POA: Diagnosis not present

## 2018-09-05 NOTE — Progress Notes (Signed)
  Jason Weaver. [name], [name]  Jason Weaver - 62 y.o. male MRN [id number]  Date of birth: 10/02/1956  Visit Date:   PCP: Marletta Lor, MD   Referred by: Marletta Lor, MD  SUBJECTIVE:  Jason Weaver is here for custom orthotic fabrication.  OBJECTIVE:  PHYSICAL EXAM: Please see previous exam notes for full evaluation of foot and gait exam. MSK Exam: Left ankle overall well aligned without significant deformity but generalized osteoarthritic bossing.  Pain with palpation of the anterior lateral and posterior medial ankle. Limited ankle dorsiflexion  ASSESSMENT  1. Arthritis of ankle or foot, degenerative, left   2. Chronic pain of left ankle   3. Gait disturbance     PLAN & PROCEDURES:   . Custom orthotics fabricated today as below     PROCEDURE: CUSTOM ORTHOTIC FABRICATION Patient's underlying musculoskeletal conditions are directly related to poor biomechanics and will benefit from a functional custom orthotic.  There are no significant foot deformities that complicate the use of a custom orthotic.  The patient was fitted for a standard, cushioned, semi-rigid orthotic. The orthotic was heated & placed on the orthotic stand. The patient was positioned in subtalar neutral position and 10 of ankle dorsiflexion and weight bearing stance on the heated orthotic blank. After completion of the molding a base was applied to the orthotic blank. The orthotic was ground to a stable position for weightbearing. The patient ambulated in these and reported they were comfortable without pressure spots.              BLANK:  Size 13 - Standard Cushioned               BASE:  Blue EVA      POSTINGS:  none          [name], [facility] Physician

## 2018-09-05 NOTE — Patient Instructions (Addendum)
We are going to be setting you up to perform a PRP (platelet rich plasma) injection. The cost of this is $495 out of pocket.  We will plan to have you come back at your convenience.  You will need to be off of anti-inflammatories for approximately 2 weeks prior to the injection and continue to avoid these medications (ibuprofen, Aleeve(naproxen), Meloxicam(mobic), Voltaren(diclofenac) during the initial 6 weeks following your injection. It is okay to take Tylenol if needed.  Please go ahead and fill your tramadol prescription so you have it available for after the procedure.

## 2018-09-05 NOTE — Progress Notes (Signed)
Jason Weaver, [name]  Jason Weaver - 62 y.o. male MRN [id number]  Date of birth: 1956/09/09  Visit Date: 09/05/2018  PCP: Marletta Lor, MD   Referred by: Marletta Lor, MD  Scribe(s) for today's visit: Wendy Poet, LAT, ATC  SUBJECTIVE:  Jason Weaver is here for Follow-up (L ankle) .    11/21/2017: Compared to the last office visit on 10/17/17, his previously described L ankle pain symptoms are improving w/ less pain.  Still has some stiffness.  It's the best his ankle has felt in over a year but he wasn't able to ski. Current symptoms are mild & are nonradiating He has been using his orthotics in his running and golf shoes and likes them so far.  03/07/2018: Compared to the last office visit on 11/21/17, his previously described L ankle pain symptoms are worsening w/ increased pain and stiffness since this winter and his injection on 10/11/17. Current symptoms are moderate & are nonradiating He has been using his orthotics which he con't to feel are helping and is interested in possibly making another pair today if possible. He is no longer wearing his Body Helix compression sleeve.  06/06/2018: Compared to the last office visit on 03/07/18, his previously described L ankle pain symptoms are equivocal and come and go depending on the day.  Majority of L ankle pain is lateral and feels a lot of tightness when DF and PFing his L ankle. Current symptoms are moderate & are nonradiating He has been wearing his custom orthotics.  He uses NSAIDs prn.  He's had a prior L ankle injection on 10/11/17 which did help and he would like another injection today.  09/05/2018: Compared to the last office visit on 06/06/18, his previously described L ankle symptoms show no change.  Pt states that he didn't have as much improvement from his last injection compared to his first injection.  He states that he  did a lot of walking and hiking during his trip to [place] and would have increased L ankle pain and swelling at the end of the day. Current symptoms are moderate & are nonradiating He has been wearing his custom orthotics.  He uses NSAIDs prn.  He's had 2 prior ankle injections on 10/11/17 and 06/06/18.  He states that he wears the OTC Dr. Felicie Morn inserts in his other shoes and they help but are not as helpful as the custom orthotics.  He states that he would like another pair of custom orthotics made if possible.  L ankle XR - 10/11/17  REVIEW OF SYSTEMS: Denies night time disturbances. Denies fevers, chills, or night sweats. Denies unexplained weight loss. Denies personal history of cancer. Denies changes in bowel or bladder habits. Denies recent unreported falls. Denies new or worsening dyspnea or wheezing. Denies headaches or dizziness.  Denies numbness, tingling or weakness  In the extremities.  Denies dizziness or presyncopal episodes Reports lower extremity edema - L lateral ankle   HISTORY & PERTINENT PRIOR DATA:  Prior History reviewed and updated per electronic medical record.  Significant/pertinent history, findings, studies include:  reports that he has never smoked. He has never used smokeless tobacco. No results for input(s): HGBA1C, LABURIC, CREATINE in the last 8760 hours. No specialty comments available. No problems updated.  OBJECTIVE:  VS:  HT:6' (182.9 cm)   WT:235 lb 6.4 oz (106.8 kg)  BMI:31.92    BP:134/86  HR:71bpm  TEMP: ( )  RESP:98 %   PHYSICAL EXAM: CONSTITUTIONAL: Well-developed, Well-nourished and In no acute distress Psychiatric: Alert & appropriately interactive. and Not depressed or anxious appearing. Respiratory: No increased work of breathing.  Trachea Midline EYES: Pupils are equal., EOM intact without nystagmus. and No scleral icterus.  Lower EXTREMITY EXAM: EXTREMITY EXAM: Warm and well perfused NEURO: unremarkable  MSK  Exam: Left ankle has generalized osteoarthritic bossing.  He is an antalgic gait.  Slight restriction and plantarflexion.   ASSESSMENT   1. Arthritis of ankle or foot, degenerative, left   2. Chronic pain of left ankle   3. Gait disturbance     PLAN:  Pertinent additional documentation may be included in corresponding procedure notes, imaging studies, problem based documentation and patient instructions.  Procedures:  . Custom cushioned insoles performed today.  Medications:  No orders of the defined types were placed in this encounter.  Discussion/Instructions: Arthritis of ankle or foot, degenerative, left New custom cushion insoles performed today given aware that on his prior pair.  We did discuss the option for PRP particularly as a possible option.  He is going to consider this and let us know if he is interested.  We discussed the entirely conclusive but he is aware this would be an out-of-pocket expense.    Discussed red flag symptoms that warrant earlier emergent evaluation and patient voices understanding. Activity modifications and the importance of avoiding exacerbating activities (limiting pain to no more than a 4 / 10 during or following activity) recommended and discussed.  Follow-up:  . Return if symptoms worsen or fail to improve, for PRP.  . If any lack of improvement: consider referral to Orthopedics for Ankle fusion and/or total ankle replacement .     CMA/ATC served as Education administrator during this visit. History, Physical, and Plan performed by medical provider. Documentation and orders reviewed and attested to.      [name], [facility] Physician

## 2018-09-06 ENCOUNTER — Encounter: Payer: Self-pay | Admitting: Sports Medicine

## 2018-09-06 NOTE — Assessment & Plan Note (Signed)
New custom cushion insoles performed today given aware that on his prior pair.  We did discuss the option for PRP particularly as a possible option.  He is going to consider this and let us know if he is interested.  We discussed the entirely conclusive but he is aware this would be an out-of-pocket expense.

## 2018-10-16 ENCOUNTER — Other Ambulatory Visit: Payer: Self-pay | Admitting: Internal Medicine

## 2018-10-16 DIAGNOSIS — Z0000 Encounter for general adult medical examination without abnormal findings: Secondary | ICD-10-CM

## 2018-10-20 ENCOUNTER — Ambulatory Visit: Payer: BLUE CROSS/BLUE SHIELD | Admitting: Family Medicine

## 2018-10-20 ENCOUNTER — Encounter: Payer: Self-pay | Admitting: Family Medicine

## 2018-10-20 VITALS — BP 124/76 | HR 76 | Temp 98.3°F | Ht 72.0 in | Wt 237.4 lb

## 2018-10-20 DIAGNOSIS — D860 Sarcoidosis of lung: Secondary | ICD-10-CM | POA: Diagnosis not present

## 2018-10-20 DIAGNOSIS — M1A09X0 Idiopathic chronic gout, multiple sites, without tophus (tophi): Secondary | ICD-10-CM | POA: Diagnosis not present

## 2018-10-20 DIAGNOSIS — N529 Male erectile dysfunction, unspecified: Secondary | ICD-10-CM

## 2018-10-20 DIAGNOSIS — E785 Hyperlipidemia, unspecified: Secondary | ICD-10-CM

## 2018-10-20 DIAGNOSIS — K5730 Diverticulosis of large intestine without perforation or abscess without bleeding: Secondary | ICD-10-CM

## 2018-10-20 DIAGNOSIS — K602 Anal fissure, unspecified: Secondary | ICD-10-CM

## 2018-10-20 DIAGNOSIS — R200 Anesthesia of skin: Secondary | ICD-10-CM

## 2018-10-20 DIAGNOSIS — B001 Herpesviral vesicular dermatitis: Secondary | ICD-10-CM

## 2018-10-20 DIAGNOSIS — R079 Chest pain, unspecified: Secondary | ICD-10-CM | POA: Insufficient documentation

## 2018-10-20 DIAGNOSIS — G4700 Insomnia, unspecified: Secondary | ICD-10-CM

## 2018-10-20 DIAGNOSIS — R202 Paresthesia of skin: Secondary | ICD-10-CM

## 2018-10-20 MED ORDER — SILDENAFIL CITRATE 20 MG PO TABS: 20.0000 mg | ORAL_TABLET | Freq: Every day | ORAL | 3 refills | Status: DC | PRN

## 2018-10-20 MED ORDER — METRONIDAZOLE 500 MG PO TABS
500.0000 mg | ORAL_TABLET | Freq: Three times a day (TID) | ORAL | 0 refills | Status: DC
Start: 2018-10-20 — End: 2018-11-15

## 2018-10-20 MED ORDER — LEVOFLOXACIN 750 MG PO TABS: 750.0000 mg | ORAL_TABLET | Freq: Every day | ORAL | 0 refills | Status: DC

## 2018-10-20 NOTE — Assessment & Plan Note (Signed)
Stable. Continue albuterol as needed.  

## 2018-10-20 NOTE — Assessment & Plan Note (Signed)
Stop Stendra.  Try sildenafil 20-100 mg as needed.  May need referral to urology if symptoms not improved with sildenafil.

## 2018-10-20 NOTE — Assessment & Plan Note (Signed)
No current flare.  Continue Valtrex as needed for outbreaks.

## 2018-10-20 NOTE — Assessment & Plan Note (Signed)
Sent in "pocket prescription" for Levaquin and Flagyl.  Discussed reasons to return to care and seek emergent care.

## 2018-10-20 NOTE — Assessment & Plan Note (Signed)
Stable.  Continue topical hydrocortisone as needed.

## 2018-10-20 NOTE — Patient Instructions (Signed)
It was very nice to see you today!  Please try the viagra.  I will also send in two antibiotics for you to use when your diverticulitis flares up.  Please try taking melatonin for sleep.  Come back soon for your physical.  Take care, Dr Jerline Pain  Please try to incorporate the following into your daily routine:  1. Sleep only long enough to feel rested and then get out of bed  2. Go to bed and get up at the same time every day  3. Do not try to force yourself to sleep. If you can't sleep, get out of bed and try again later.  4. Have coffee, tea, and other foods that have caffeine only in the morning  5. Avoid alcohol in the late afternoon, evening, and bedtime  6. Avoid smoking, especially in the evening  7. Keep your bedroom dark, cool, quiet, and free of reminders of work or other things that cause you stress  8. Solve problems you have before you go to bed  9. Exercise several days a week, but not right before bed  10. Avoid looking at phones or reading devices ("e-books") that give off light before bed. This can make it harder to fall asleep.

## 2018-10-20 NOTE — Assessment & Plan Note (Signed)
Likely secondary to mild radicular symptoms from his low back.  Recent TSH and blood glucose within normal limits.  Patient takes vitamin B12 supplement and eats meat-doubt B12 deficiency.  His exam is normal.  Recommended follow-up with PT or sports medicine to discuss his lumbar back arthritis.

## 2018-10-20 NOTE — Progress Notes (Signed)
Subjective:  Jason Weaver is a 62 y.o. male who presents today with a chief complaint of foot numbness and to transfer care to this office.   HPI:  Foot numbness Started several months ago.  Located in bilateral first 3 toes.  Symptoms only occur at night when sleeping.  Does not have any symptoms throughout the day.  No obvious precipitating events.  No treatments tried.  Chest pain Started a couple of years ago.  Will intermittently get sharp, shooting left-sided chest pain while driving.  Pain lasts for a few seconds and then subsides.  No associated shortness of breath.  No nausea or vomiting.  No symptoms with exertion.  Reportedly had a stress test be his work which was normal recently.  His other chronic medical conditions are outlined below:  Dyslipidemia Stable. Diet controlled - has statin intolerance.   Insomnia Stable. Takes ambien 10mg  as needed.   OSA Stable on CPAP.  ED  Stable. Takes stendra 100-200mg  as needed.  He feels like it does not work as well as he used to.  Having no difficulty recently.  He has never tried anything else for this.  Gout Not currently on any medications.  No flareup in years.  Lung Sarcoidosis   Takes albuterol as needed.  No recent flares.  Anal fissure Uses hydrocortisone as needed.  Symptoms are well controlled.  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   Diverticulosis No current symptoms.  Has not had a flare in several years.  ROS: Per HPI   PMH: He reports that he has never smoked. He has never used smokeless tobacco. He reports current alcohol use of about 2.0 standard drinks of alcohol per week. He reports that he does not use drugs.  Objective:  Physical Exam: BP 124/76 (BP Location: Left Arm, Patient Position: Sitting, Cuff Size: Large)   Pulse 76   Temp 98.3 F (36.8 C) (Oral)   Ht 6' (1.829 m)   Wt 237 lb 6.1 oz (107.7 kg)   SpO2 98%   BMI 32.19 kg/m   Wt Readings from Last 3 Encounters:    10/20/18 237 lb 6.1 oz (107.7 kg)  09/05/18 235 lb 6.4 oz (106.8 kg)  06/06/18 233 lb 9.6 oz (106 kg)  Gen: NAD, resting comfortably CV: RRR with no murmurs appreciated Pulm: NWOB, CTAB with no crackles, wheezes, or rhonchi GI: Normal bowel sounds present. Soft, Nontender, Nondistended. MSK: No edema, cyanosis, or clubbing noted -Back: No deformities.  Nontender to palpation.  Range of motion throughout. -Lower extremities: No deformities.  Sensation light touch intact throughout.  DP and PT pulses 2+ and symmetric bilaterally.  Tinel sign negative at bilateral medial malleoli.  Straight leg raise negative bilaterally. Skin: Warm, dry Neuro: Grossly normal, moves all extremities Psych: Normal affect and thought content  Assessment/Plan:  Chest pain No red flags.  Reassuring that he has had a recent normal stress test and EKG.  History not consistent with cardiac etiology.  Discussed warning signs and reasons to return to care.  Numbness and tingling of foot Likely secondary to mild radicular symptoms from his low back.  Recent TSH and blood glucose within normal limits.  Patient takes vitamin B12 supplement and eats meat-doubt B12 deficiency.  His exam is normal.  Recommended follow-up with PT or sports medicine to discuss his lumbar back arthritis.  Gout Stable.  Follows with rheumatology.  Dyslipidemia with statin intolerance Continue dietary modifications.  Check lipid panel with next  blood draw.  Diverticulosis of large intestine Sent in "pocket prescription" for Levaquin and Flagyl.  Discussed reasons to return to care and seek emergent care.  Insomnia Stable.  Continue Ambien 10 mg nightly as needed.  Discussed sleep hygiene.  Handout given.  Also recommended over-the-counter melatonin as needed.  Erectile dysfunction Stop Stendra.  Try sildenafil 20-100 mg as needed.  May need referral to urology if symptoms not improved with sildenafil.  Pulmonary sarcoidosis  (HCC) Stable.  Continue albuterol as needed.  Anal fissure Stable.  Continue topical hydrocortisone as needed.  Cold sore No current flare.  Continue Valtrex as needed for outbreaks.  Time Spent: I spent >40 minutes face-to-face with the patient, with more than half spent on counseling for management plan for his numbness/tingling, chest pain, diverticulosis, insomnia, erectile dysfunction, pulmonary sarcoidosis, anal fissure, cold sores, and dyslipidemia.  Preventative Healthcare Patient was instructed to return soon for CPE. Health Maintenance Due  Topic Date Due  . HIV Screening  02/13/1973   Algis Greenhouse. Jerline Pain, MD 10/20/2018 12:45 PM

## 2018-10-20 NOTE — Assessment & Plan Note (Addendum)
Continue dietary modifications.  Check lipid panel with next blood draw.

## 2018-10-20 NOTE — Assessment & Plan Note (Signed)
Stable.  Follows with rheumatology.

## 2018-10-20 NOTE — Assessment & Plan Note (Signed)
No red flags.  Reassuring that he has had a recent normal stress test and EKG.  History not consistent with cardiac etiology.  Discussed warning signs and reasons to return to care.

## 2018-10-20 NOTE — Assessment & Plan Note (Signed)
Stable.  Continue Ambien 10 mg nightly as needed.  Discussed sleep hygiene.  Handout given.  Also recommended over-the-counter melatonin as needed.

## 2018-11-05 ENCOUNTER — Ambulatory Visit (INDEPENDENT_AMBULATORY_CARE_PROVIDER_SITE_OTHER): Payer: BLUE CROSS/BLUE SHIELD | Admitting: Family Medicine

## 2018-11-05 ENCOUNTER — Encounter: Payer: Self-pay | Admitting: Family Medicine

## 2018-11-05 VITALS — BP 116/72 | HR 65 | Temp 98.7°F | Ht 72.0 in | Wt 234.4 lb

## 2018-11-05 DIAGNOSIS — R202 Paresthesia of skin: Secondary | ICD-10-CM | POA: Diagnosis not present

## 2018-11-05 DIAGNOSIS — M170 Bilateral primary osteoarthritis of knee: Secondary | ICD-10-CM | POA: Diagnosis not present

## 2018-11-05 DIAGNOSIS — E785 Hyperlipidemia, unspecified: Secondary | ICD-10-CM | POA: Diagnosis not present

## 2018-11-05 DIAGNOSIS — M1A09X0 Idiopathic chronic gout, multiple sites, without tophus (tophi): Secondary | ICD-10-CM

## 2018-11-05 DIAGNOSIS — R200 Anesthesia of skin: Secondary | ICD-10-CM | POA: Diagnosis not present

## 2018-11-05 DIAGNOSIS — Z6831 Body mass index (BMI) 31.0-31.9, adult: Secondary | ICD-10-CM

## 2018-11-05 DIAGNOSIS — M79671 Pain in right foot: Secondary | ICD-10-CM | POA: Diagnosis not present

## 2018-11-05 DIAGNOSIS — G4700 Insomnia, unspecified: Secondary | ICD-10-CM | POA: Diagnosis not present

## 2018-11-05 DIAGNOSIS — Z0001 Encounter for general adult medical examination with abnormal findings: Secondary | ICD-10-CM

## 2018-11-05 DIAGNOSIS — N529 Male erectile dysfunction, unspecified: Secondary | ICD-10-CM

## 2018-11-05 DIAGNOSIS — D860 Sarcoidosis of lung: Secondary | ICD-10-CM

## 2018-11-05 DIAGNOSIS — Z125 Encounter for screening for malignant neoplasm of prostate: Secondary | ICD-10-CM | POA: Diagnosis not present

## 2018-11-05 DIAGNOSIS — Z0000 Encounter for general adult medical examination without abnormal findings: Secondary | ICD-10-CM | POA: Diagnosis not present

## 2018-11-05 LAB — PSA: PSA: 1.88 ng/mL (ref 0.10–4.00)

## 2018-11-05 LAB — COMPREHENSIVE METABOLIC PANEL
ALBUMIN: 4.4 g/dL (ref 3.5–5.2)
ALK PHOS: 63 U/L (ref 39–117)
ALT: 30 U/L (ref 0–53)
AST: 19 U/L (ref 0–37)
BUN: 20 mg/dL (ref 6–23)
CALCIUM: 10 mg/dL (ref 8.4–10.5)
CHLORIDE: 103 meq/L (ref 96–112)
CO2: 29 mEq/L (ref 19–32)
Creatinine, Ser: 1.09 mg/dL (ref 0.40–1.50)
GFR: 73.16 mL/min (ref >60.00)
Glucose, Bld: 94 mg/dL (ref 70–99)
POTASSIUM: 4.4 meq/L (ref 3.5–5.1)
SODIUM: 138 meq/L (ref 135–145)
Total Bilirubin: 0.8 mg/dL (ref 0.2–1.2)
Total Protein: 7 g/dL (ref 6.0–8.3)

## 2018-11-05 LAB — LIPID PANEL
CHOLESTEROL: 253 mg/dL — AB (ref 0–200)
HDL: 45 mg/dL (ref >39.00)
NonHDL: 207.79
TRIGLYCERIDES: 239 mg/dL — AB (ref 0.0–149.0)
Total CHOL/HDL Ratio: 6
VLDL: 47.8 mg/dL — AB (ref 0.0–40.0)

## 2018-11-05 LAB — CBC
HEMATOCRIT: 48.3 % (ref 39.0–52.0)
HEMOGLOBIN: 17 g/dL (ref 13.0–17.0)
MCHC: 35.3 g/dL (ref 30.0–36.0)
MCV: 91.3 fl (ref 78.0–100.0)
Platelets: 234 10*3/uL (ref 150.0–400.0)
RBC: 5.29 Mil/uL (ref 4.22–5.81)
RDW: 14.2 % (ref 11.5–15.5)
WBC: 6.5 10*3/uL (ref 4.0–10.5)

## 2018-11-05 LAB — VITAMIN B12: Vitamin B-12: 453 pg/mL (ref 211–911)

## 2018-11-05 LAB — LDL CHOLESTEROL, DIRECT: Direct LDL: 160 mg/dL

## 2018-11-05 LAB — URIC ACID: URIC ACID, SERUM: 9.7 mg/dL — AB (ref 4.0–7.8)

## 2018-11-05 LAB — TSH: TSH: 3.37 u[IU]/mL (ref 0.35–4.50)

## 2018-11-05 MED ORDER — ZOLPIDEM TARTRATE 10 MG PO TABS: 10.0000 mg | ORAL_TABLET | Freq: Every evening | ORAL | 0 refills | Status: DC | PRN

## 2018-11-05 MED ORDER — ZOLPIDEM TARTRATE 10 MG PO TABS: ORAL_TABLET | ORAL | 0 refills | Status: DC

## 2018-11-05 NOTE — Assessment & Plan Note (Signed)
Check lipid panel  

## 2018-11-05 NOTE — Assessment & Plan Note (Signed)
Stable.  Has rheumatology follow-up later today.  Check uric acid level.

## 2018-11-05 NOTE — Patient Instructions (Signed)
It was very nice to see you today!  I will refill your meds.  Please let me know if you need further assistance for your foot numbness.  We will check blood work.  Come back to see me in 1 year for your next physical, or sooner as needed.   Take care, Dr Jerline Pain   Preventive Care 40-64 Years, Male Preventive care refers to lifestyle choices and visits with your health care provider that can promote health and wellness. What does preventive care include?   A yearly physical exam. This is also called an annual well check.  Dental exams once or twice a year.  Routine eye exams. Ask your health care provider how often you should have your eyes checked.  Personal lifestyle choices, including: ? Daily care of your teeth and gums. ? Regular physical activity. ? Eating a healthy diet. ? Avoiding tobacco and drug use. ? Limiting alcohol use. ? Practicing safe sex. ? Taking low-dose aspirin every day starting at age 90. What happens during an annual well check? The services and screenings done by your health care provider during your annual well check will depend on your age, overall health, lifestyle risk factors, and family history of disease. Counseling Your health care provider may ask you questions about your:  Alcohol use.  Tobacco use.  Drug use.  Emotional well-being.  Home and relationship well-being.  Sexual activity.  Eating habits.  Work and work Statistician. Screening You may have the following tests or measurements:  Height, weight, and BMI.  Blood pressure.  Lipid and cholesterol levels. These may be checked every 5 years, or more frequently if you are over 38 years old.  Skin check.  Lung cancer screening. You may have this screening every year starting at age 25 if you have a 30-pack-year history of smoking and currently smoke or have quit within the past 15 years.  Colorectal cancer screening. All adults should have this screening starting at  age 61 and continuing until age 65. Your health care provider may recommend screening at age 64. You will have tests every 1-10 years, depending on your results and the type of screening test. People at increased risk should start screening at an earlier age. Screening tests may include: ? Guaiac-based fecal occult blood testing. ? Fecal immunochemical test (FIT). ? Stool DNA test. ? Virtual colonoscopy. ? Sigmoidoscopy. During this test, a flexible tube with a tiny camera (sigmoidoscope) is used to examine your rectum and lower colon. The sigmoidoscope is inserted through your anus into your rectum and lower colon. ? Colonoscopy. During this test, a long, thin, flexible tube with a tiny camera (colonoscope) is used to examine your entire colon and rectum.  Prostate cancer screening. Recommendations will vary depending on your family history and other risks.  Hepatitis C blood test.  Hepatitis B blood test.  Sexually transmitted disease (STD) testing.  Diabetes screening. This is done by checking your blood sugar (glucose) after you have not eaten for a while (fasting). You may have this done every 1-3 years. Discuss your test results, treatment options, and if necessary, the need for more tests with your health care provider. Vaccines Your health care provider may recommend certain vaccines, such as:  Influenza vaccine. This is recommended every year.  Tetanus, diphtheria, and acellular pertussis (Tdap, Td) vaccine. You may need a Td booster every 10 years.  Varicella vaccine. You may need this if you have not been vaccinated.  Zoster vaccine. You may need this  after age 61.  Measles, mumps, and rubella (MMR) vaccine. You may need at least one dose of MMR if you were born in 1957 or later. You may also need a second dose.  Pneumococcal 13-valent conjugate (PCV13) vaccine. You may need this if you have certain conditions and have not been vaccinated.  Pneumococcal polysaccharide  (PPSV23) vaccine. You may need one or two doses if you smoke cigarettes or if you have certain conditions.  Meningococcal vaccine. You may need this if you have certain conditions.  Hepatitis A vaccine. You may need this if you have certain conditions or if you travel or work in places where you may be exposed to hepatitis A.  Hepatitis B vaccine. You may need this if you have certain conditions or if you travel or work in places where you may be exposed to hepatitis B.  Haemophilus influenzae type b (Hib) vaccine. You may need this if you have certain risk factors. Talk to your health care provider about which screenings and vaccines you need and how often you need them. This information is not intended to replace advice given to you by your health care provider. Make sure you discuss any questions you have with your health care provider. Document Released: 11/04/2015 Document Revised: 11/28/2017 Document Reviewed: 08/09/2015 Elsevier Interactive Patient Education  2019 [employer].

## 2018-11-05 NOTE — Assessment & Plan Note (Signed)
Stable. Continue albuterol as needed.  

## 2018-11-05 NOTE — Progress Notes (Signed)
Subjective:  Jason Weaver is a 63 y.o. male who presents today for his annual comprehensive physical exam.    HPI:  He has no acute complaints today.   His stable, chronic medical conditions are outlined below:  #Gout -Follows with rheumatology. No recent flares  #Dyslipidemia with statin intolerance - Diet controlled.  # Insomnia - Stable on ambien 10mg  nightly as needed  # Erectile dysfunction - On sildenafil as needed  # Pulmonary sarcoidosis (HCC) - On albuterol inhaler prn  # history of cold sores - Valtrex as needed  Lifestyle Diet: No specific diets.  Exercise: Goes to gym regularly. 30 minutes at a time.   Depression screen PHQ 2/9 10/20/2018  Decreased Interest 0  Down, Depressed, Hopeless 0  PHQ - 2 Score 0    Health Maintenance Due  Topic Date Due  . HIV Screening  02/13/1971     ROS: Per HPI, otherwise a complete review of systems was negative.   PMH:  The following were reviewed and entered/updated in epic: Past Medical History:  Diagnosis Date  . Diverticulosis of colon   . ED (erectile dysfunction)   . Gout   . Hearing loss   . Hx of colonic polyps   . Hyperlipidemia   . Nasal septal deviation   . Obesity   . Pulmonary sarcoidosis ([place])  1997    biopsy proven   Patient Active Problem List   Diagnosis Date Noted  . Numbness and tingling of foot 10/20/2018  . Insomnia 10/20/2018  . Erectile dysfunction 10/20/2018  . Anal fissure 10/20/2018  . Cold sore 10/20/2018  . Arthritis of ankle or foot, degenerative, left 04/20/2014  . Allergic rhinitis 05/29/2012  . OSA (obstructive sleep apnea) 06/04/2011  . Pulmonary sarcoidosis ([place]) 01/11/2011  . Gout 11/11/2009  . Diverticulosis of large intestine 11/11/2009  . History of colonic polyps 11/11/2009  . Dyslipidemia with statin intolerance 10/24/2009  . Deafness, left 08/23/2009   Past Surgical History:  Procedure Laterality Date  . CATARACT EXTRACTION    . HERNIA  REPAIR  2009  . LUNG BIOPSY  1997  . TONSILLECTOMY  1979    Family History  Problem Relation Age of Onset  . COPD Father        was a smoker  . Arthritis Father   . Asthma Sister   . Cancer Sister     Medications- reviewed and updated Current Outpatient Medications  Medication Sig Dispense Refill  . albuterol (PROAIR HFA) 108 (90 Base) MCG/ACT inhaler INHALE 2 PUFFS INTO THE LUNGS EVERY 6 (SIX) HOURS AS NEEDED FOR WHEEZING. 8.5 each 4  . Ascorbic Acid (VITAMIN C) 500 MG tablet Take 500 mg by mouth daily.      . diphenhydrAMINE (BENADRYL) 25 MG tablet Take 25 mg by mouth every 6 (six) hours as needed for allergies.    . hydrocortisone (ANUSOL-HC) 2.5 % rectal cream Place rectally 2 (two) times daily. 30 g 3  . levofloxacin (LEVAQUIN) 750 MG tablet Take 1 tablet (750 mg total) by mouth daily. 7 tablet 0  . metroNIDAZOLE (FLAGYL) 500 MG tablet Take 1 tablet (500 mg total) by mouth 3 (three) times daily. 21 tablet 0  . sildenafil (REVATIO) 20 MG tablet Take 1-5 tablets (20-100 mg total) by mouth daily as needed (erectile dysfunction). 90 tablet 3  . valACYclovir (VALTREX) 1000 MG tablet Take 1 g by mouth as directed. Take 1 tablet daily for 3-4 days as needed for flare ups  2  .  zolpidem (AMBIEN) 10 MG tablet Take 1 tablet (10 mg total) by mouth at bedtime as needed for up to 30 days for sleep. 30 tablet 0   No current facility-administered medications for this visit.     Allergies-reviewed and updated Allergies  Allergen Reactions  . Ciprofloxacin Rash    Social History   Socioeconomic History  . Marital status: Married    Spouse name: Not on file  . Number of children: Not on file  . Years of education: Not on file  . Highest education level: Not on file  Occupational History  . Occupation: Herbalist: [employer]  . Financial resource strain: Not on file  . Food insecurity:    Worry: Not on file    Inability: Not on file  .  Transportation needs:    Medical: Not on file    Non-medical: Not on file  Tobacco Use  . Smoking status: Never Smoker  . Smokeless tobacco: Never Used  Substance and Sexual Activity  . Alcohol use: Yes    Alcohol/week: 2.0 standard drinks    Types: 2 Glasses of wine per week  . Drug use: Never  . Sexual activity: Yes  Lifestyle  . Physical activity:    Days per week: Not on file    Minutes per session: Not on file  . Stress: Not on file  Relationships  . Social connections:    Talks on phone: Not on file    Gets together: Not on file    Attends religious service: Not on file    Active member of club or organization: Not on file    Attends meetings of clubs or organizations: Not on file    Relationship status: Not on file  Other Topics Concern  . Not on file  Social History Narrative  . Not on file    Objective:  Physical Exam: BP 116/72 (BP Location: Left Arm, Patient Position: Sitting, Cuff Size: Large)   Pulse 65   Temp 98.7 F (37.1 C) (Oral)   Ht 6' (1.829 m)   Wt 234 lb 6.1 oz (106.3 kg)   SpO2 96%   BMI 31.79 kg/m   Body mass index is 31.79 kg/m. Wt Readings from Last 3 Encounters:  11/05/18 234 lb 6.1 oz (106.3 kg)  10/20/18 237 lb 6.1 oz (107.7 kg)  09/05/18 235 lb 6.4 oz (106.8 kg)   Gen: NAD, resting comfortably HEENT: TMs normal bilaterally. OP clear. No thyromegaly noted.  CV: RRR with no murmurs appreciated Pulm: NWOB, CTAB with no crackles, wheezes, or rhonchi GI: Normal bowel sounds present. Soft, Nontender, Nondistended. MSK: no edema, cyanosis, or clubbing noted Skin: warm, dry Neuro: CN2-12 grossly intact. Strength 5/5 in upper and lower extremities. Reflexes symmetric and intact bilaterally.  Psych: Normal affect and thought content  Assessment/Plan:  Numbness and tingling of foot Possibly related to also transverse arch vs lumbar back arthritis.  Will be following up with rheumatology later today.  Check TSH and B12 to rule out other  possible causes.   Insomnia Stable.  Ambien refilled.  Gout Stable.  Has rheumatology follow-up later today.  Check uric acid level.  Erectile dysfunction Stable.  Continue sildenafil as needed.  Dyslipidemia with statin intolerance Check lipid panel.  Pulmonary sarcoidosis (HCC) Stable.  Continue albuterol as needed.  Preventative Healthcare: Check PSA.   Patient Counseling(The following topics were reviewed and/or handout was given):  -Nutrition: Stressed importance of moderation in sodium/caffeine  intake, saturated fat and cholesterol, caloric balance, sufficient intake of fresh fruits, vegetables, and fiber.  -Stressed the importance of regular exercise.   -Substance Abuse: Discussed cessation/primary prevention of tobacco, alcohol, or other drug use; driving or other dangerous activities under the influence; availability of treatment for abuse.   -Injury prevention: Discussed safety belts, safety helmets, smoke detector, smoking near bedding or upholstery.   -Sexuality: Discussed sexually transmitted diseases, partner selection, use of condoms, avoidance of unintended pregnancy and contraceptive alternatives.   -Dental health: Discussed importance of regular tooth brushing, flossing, and dental visits.  -Health maintenance and immunizations reviewed. Please refer to Health maintenance section.  Return to care in 1 year for next preventative visit.   Algis Greenhouse. Jerline Pain, MD 11/05/2018 10:05 AM

## 2018-11-05 NOTE — Assessment & Plan Note (Signed)
Stable. Continue sildenafil as needed. 

## 2018-11-05 NOTE — Assessment & Plan Note (Signed)
Stable.  Ambien refilled. 

## 2018-11-05 NOTE — Assessment & Plan Note (Addendum)
Possibly related to also transverse arch vs lumbar back arthritis.  Will be following up with rheumatology later today.  Check TSH and B12 to rule out other possible causes.

## 2018-11-06 ENCOUNTER — Telehealth: Payer: Self-pay | Admitting: Family Medicine

## 2018-11-06 NOTE — Telephone Encounter (Signed)
See note  Copied from [place] [id number]. Topic: Quick Communication - Lab Results (Clinic Use ONLY) >> Nov 06, 2018  1:23 PM Lennox Solders wrote: Pt would like blood work results. Pt had labs drawn yesterday

## 2018-11-06 NOTE — Telephone Encounter (Signed)
LM for patient to return call.  CRM started.

## 2018-11-06 NOTE — Progress Notes (Signed)
Please inform patient of the following:  His uric acid level is up. He would benefit from starting uric acid lowering agent such as allopurinol. We can call this in or his rheumatologist can.   His cholesterol is elevated, but better than it was a year ago. He should keep up the good work with diet and exercise and we can recheck in a year.  All of his other labs were all normal, including his PSA.   Did not see any obvious causes for his numbness based on the blood work - looks like he will be following up with neuro for this issue.  Will see him back in a year for his next physical, or sooner as needed.

## 2018-11-09 ENCOUNTER — Emergency Department (HOSPITAL_COMMUNITY): Payer: BLUE CROSS/BLUE SHIELD

## 2018-11-09 ENCOUNTER — Emergency Department (HOSPITAL_COMMUNITY)
Admission: EM | Admit: 2018-11-09 | Discharge: 2018-11-09 | Disposition: A | Discharge status: Home / Self Care | Payer: BLUE CROSS/BLUE SHIELD | Attending: Emergency Medicine | Admitting: Emergency Medicine

## 2018-11-09 ENCOUNTER — Encounter (HOSPITAL_COMMUNITY): Payer: Self-pay | Admitting: *Deleted

## 2018-11-09 ENCOUNTER — Other Ambulatory Visit: Payer: Self-pay

## 2018-11-09 DIAGNOSIS — R200 Anesthesia of skin: Secondary | ICD-10-CM | POA: Diagnosis not present

## 2018-11-09 DIAGNOSIS — M79601 Pain in right arm: Secondary | ICD-10-CM | POA: Diagnosis not present

## 2018-11-09 DIAGNOSIS — R030 Elevated blood-pressure reading, without diagnosis of hypertension: Secondary | ICD-10-CM | POA: Diagnosis not present

## 2018-11-09 DIAGNOSIS — M79621 Pain in right upper arm: Secondary | ICD-10-CM | POA: Diagnosis not present

## 2018-11-09 LAB — COMPREHENSIVE METABOLIC PANEL
ALBUMIN: 4.5 g/dL (ref 3.5–5.0)
ALT: 33 U/L (ref 0–44)
AST: 23 U/L (ref 15–41)
Alkaline Phosphatase: 61 U/L (ref 38–126)
Anion gap: 8 (ref 5–15)
BUN: 16 mg/dL (ref 6–20)
CALCIUM: 9.7 mg/dL (ref 8.9–10.3)
CHLORIDE: 107 mmol/L (ref 98–111)
CO2: 22 mmol/L (ref 22–32)
CREATININE: 0.88 mg/dL (ref 0.61–1.24)
GFR calc Af Amer: >60 mL/min (ref >60)
GFR calc non Af Amer: >60 mL/min (ref >60)
Glucose, Bld: 92 mg/dL (ref 70–99)
Potassium: 4.1 mmol/L (ref 3.5–5.1)
Sodium: 137 mmol/L (ref 135–145)
Total Bilirubin: 1 mg/dL (ref 0.3–1.2)
Total Protein: 7.4 g/dL (ref 6.5–8.1)

## 2018-11-09 LAB — CBC WITH DIFFERENTIAL/PLATELET
ABS IMMATURE GRANULOCYTES: 0.03 10*3/uL (ref 0.00–0.07)
BASOS ABS: 0.1 10*3/uL (ref 0.0–0.1)
Basophils Relative: 1 %
Eosinophils Absolute: 0.4 10*3/uL (ref 0.0–0.5)
Eosinophils Relative: 8 %
HCT: 48 % (ref 39.0–52.0)
Hemoglobin: 16.5 g/dL (ref 13.0–17.0)
Immature Granulocytes: 1 %
Lymphocytes Relative: 11 %
Lymphs Abs: 0.6 10*3/uL — ABNORMAL LOW (ref 0.7–4.0)
MCH: 31.2 pg (ref 26.0–34.0)
MCHC: 34.4 g/dL (ref 30.0–36.0)
MCV: 90.7 fL (ref 80.0–100.0)
Monocytes Absolute: 0.6 10*3/uL (ref 0.1–1.0)
Monocytes Relative: 12 %
NEUTROS ABS: 3.6 10*3/uL (ref 1.7–7.7)
Neutrophils Relative %: 67 %
Platelets: 229 10*3/uL (ref 150–400)
RBC: 5.29 MIL/uL (ref 4.22–5.81)
RDW: 13.1 % (ref 11.5–15.5)
WBC: 5.4 10*3/uL (ref 4.0–10.5)
nRBC: 0 % (ref 0.0–0.2)

## 2018-11-09 NOTE — ED Triage Notes (Signed)
Pt c/o right arm numbness that started x 1 hour ago.  Pt denies injury/trauma or n/v.  +radial pulse, skin warm & dry.

## 2018-11-09 NOTE — ED Notes (Signed)
Pt requested to wait to see if the provider would like him to get in a gown.

## 2018-11-10 ENCOUNTER — Encounter: Payer: Self-pay | Admitting: Family Medicine

## 2018-11-10 ENCOUNTER — Ambulatory Visit: Payer: BLUE CROSS/BLUE SHIELD | Admitting: Family Medicine

## 2018-11-10 ENCOUNTER — Encounter: Payer: Self-pay | Admitting: Neurology

## 2018-11-10 ENCOUNTER — Telehealth: Payer: Self-pay | Admitting: Family Medicine

## 2018-11-10 VITALS — BP 122/78 | HR 69 | Temp 97.9°F | Ht 72.0 in | Wt 232.2 lb

## 2018-11-10 DIAGNOSIS — R29818 Other symptoms and signs involving the nervous system: Secondary | ICD-10-CM

## 2018-11-10 MED ORDER — BACLOFEN 20 MG PO TABS: 20.0000 mg | ORAL_TABLET | Freq: Three times a day (TID) | ORAL | 0 refills | Status: DC

## 2018-11-10 NOTE — ED Provider Notes (Signed)
Emergency Department Provider Note   I have reviewed the triage vital signs and the nursing notes.   HISTORY  Chief Complaint right arm numbness   HPI Jason Weaver is a 63 y.o. male with medical problems document below the presents the emergency department today with abnormal feeling of his right arm.  Patient states that he woke up this morning and noticed that his right arm felt odd.  He said it feels kind of numb but that is only working used to explain it.  He states he does not have any tingling or pain and can feel normally just at the arm itself feels strange in the anterior shoulder area.  No other associated symptoms.  No other weakness or numbness anywhere else.  No tingling.  No chest pain, shortness of breath, cough or other associated symptoms.  No fevers.  No recent illnesses. No other associated or modifying symptoms.    Past Medical History:  Diagnosis Date  . Diverticulosis of colon   . ED (erectile dysfunction)   . Gout   . Hearing loss   . Hx of colonic polyps   . Hyperlipidemia   . Nasal septal deviation   . Obesity   . Pulmonary sarcoidosis ([place])  1997    biopsy proven    Patient Active Problem List   Diagnosis Date Noted  . Numbness and tingling of foot 10/20/2018  . Insomnia 10/20/2018  . Erectile dysfunction 10/20/2018  . Anal fissure 10/20/2018  . Cold sore 10/20/2018  . Arthritis of ankle or foot, degenerative, left 04/20/2014  . Allergic rhinitis 05/29/2012  . OSA (obstructive sleep apnea) 06/04/2011  . Pulmonary sarcoidosis ([place]) 01/11/2011  . Gout 11/11/2009  . Diverticulosis of large intestine 11/11/2009  . History of colonic polyps 11/11/2009  . Dyslipidemia with statin intolerance 10/24/2009  . Deafness, left 08/23/2009    Past Surgical History:  Procedure Laterality Date  . CATARACT EXTRACTION    . HERNIA REPAIR  2009  . LUNG BIOPSY  1997  . TONSILLECTOMY  1979    Current Outpatient Rx  . Order #: [id number] Class: Normal   . Order #: [id number] Class: Historical Med  . Order #: [id number] Class: Historical Med  . Order #: [id number] Class: Normal  . Order #: [id number] Class: Normal  . Order #: [id number] Class: Normal  . Order #: [id number] Class: Normal  . Order #: [id number] Class: Historical Med  . Order #: [id number] Class: Normal    Allergies Ciprofloxacin  Family History  Problem Relation Age of Onset  . COPD Father        was a smoker  . Arthritis Father   . Asthma Sister   . Cancer Sister     Social History Social History   Tobacco Use  . Smoking status: Never Smoker  . Smokeless tobacco: Never Used  Substance Use Topics  . Alcohol use: Yes    Alcohol/week: 2.0 standard drinks    Types: 2 Glasses of wine per week  . Drug use: Never    Review of Systems  All other systems negative except as documented in the HPI. All pertinent positives and negatives as reviewed in the HPI. ____________________________________________   PHYSICAL EXAM:  VITAL SIGNS: ED Triage Vitals  Enc Vitals Group     BP 11/09/18 0453 (!) 154/105     Pulse Rate 11/09/18 0453 88     Resp 11/09/18 0453 18     Temp 11/09/18 0453 97.9 F (36.6 C)     Temp Source 11/09/18 0453 Oral  SpO2 11/09/18 0453 98 %     Weight 11/09/18 0453 234 lb (106.1 kg)     Height 11/09/18 0453 6' (1.829 m)     Head Circumference --      Peak Flow --      Pain Score 11/09/18 0501 0     Pain Loc --      Pain Edu? --      Excl. in [place]? --     Constitutional: Alert and oriented. Well appearing and in no acute distress. Eyes: Conjunctivae are normal. PERRL. EOMI. Head: Atraumatic. Nose: No congestion/rhinnorhea. Mouth/Throat: Mucous membranes are moist.  Oropharynx non-erythematous. Neck: No stridor.  No meningeal signs.   Cardiovascular: Normal rate, regular rhythm. Good peripheral circulation. Grossly normal heart sounds.   Respiratory: Normal respiratory effort.  No retractions. Lungs CTAB. Gastrointestinal: Soft and nontender.  No distention.  Musculoskeletal: No lower extremity tenderness nor edema. No gross deformities of extremities. Neurologic:  Normal speech and language. No gross focal neurologic deficits are appreciated. No altered mental status, able to give full seemingly accurate history.  Face is symmetric, EOM's intact, pupils equal and reactive, vision intact, tongue and uvula midline without deviation. Upper and Lower extremity motor 5/5, intact pain perception in distal extremities, 2+ reflexes in biceps, patella and achilles tendons. Able to perform finger to nose normal with both hands. Walks without assistance or evident ataxia.  Skin:  Skin is warm, dry and intact. No rash noted.   ____________________________________________   LABS (all labs ordered are listed, but only abnormal results are displayed)  Labs Reviewed  CBC WITH DIFFERENTIAL/PLATELET - Abnormal; Notable for the following components:      Result Value   Lymphs Abs 0.6 (*)    All other components within normal limits  COMPREHENSIVE METABOLIC PANEL   ____________________________________________  EKG   EKG Interpretation  Date/Time:    Ventricular Rate:    PR Interval:    QRS Duration:   QT Interval:    QTC Calculation:   R Axis:     Text Interpretation:         ____________________________________________  RADIOLOGY  Ct Head Wo Contrast  Result Date: 11/09/2018 CLINICAL DATA:  Initial evaluation for acute right arm numbness and weakness. EXAM: CT HEAD WITHOUT CONTRAST TECHNIQUE: Contiguous axial images were obtained from the base of the skull through the vertex without intravenous contrast. COMPARISON:  Prior MRI from 02/25/2008. FINDINGS: Brain: Cerebral volume within normal limits. Mild chronic small vessel ischemic disease. No acute intracranial hemorrhage. No acute large vessel territory infarct. No mass lesion, midline shift or mass effect. No hydrocephalus. No extra-axial fluid collection. Vascular: No  hyperdense vessel. Scattered vascular calcifications noted within the carotid siphons. Skull: Scalp soft tissues and calvarium within normal limits. Sinuses/Orbits: Globes and orbital soft tissues normal. Chronic mucosal thickening throughout the paranasal sinuses. Mastoid air cells are clear. Other: None. IMPRESSION: 1. No acute intracranial abnormality. 2. Mild chronic microvascular ischemic disease. Electronically Signed   By: Jeannine Boga M.D.   On: 11/09/2018 06:59    ____________________________________________   PROCEDURES  Procedure(s) performed:   Procedures   ____________________________________________   INITIAL IMPRESSION / ASSESSMENT AND PLAN / ED COURSE  Unclear etiology for the patient's symptoms.  Very inconsistent with CVA.  I doubt that.  EKG without any evidence of ischemia.  Electrolytes within normal limits.  Consider possible peripheral nerve issue however does not really fit a dermatomal pattern as it is only in his right anterior shoulder that he  has a sensation.  His exam is perfect sensation to light touch to pain and two-point discrimination in the right arm in multiple dermatomes.  No weakness.  Unclear what the etiology is but patient will continue following up as an outpatient.     Pertinent labs & imaging results that were available during my care of the patient were reviewed by me and considered in my medical decision making (see chart for details).  ____________________________________________  FINAL CLINICAL IMPRESSION(S) / ED DIAGNOSES  Final diagnoses:  Pain of right upper extremity     MEDICATIONS GIVEN DURING THIS VISIT:  Medications - No data to display   NEW OUTPATIENT MEDICATIONS STARTED DURING THIS VISIT:  Discharge Medication List as of 11/09/2018  8:21 AM      Note:  This note was prepared with assistance of Dragon voice recognition software. Occasional wrong-word or sound-a-like substitutions may have occurred due to the  inherent limitations of voice recognition software.   Gurnoor Sloop, Corene Cornea, MD 11/10/18 0010

## 2018-11-10 NOTE — Patient Instructions (Addendum)
Your imaging referral has been sent to [facility].  Their phone number is [phone].  Please wait 2 business days before calling them to schedule your appointment.   -will do trial of muscle relaxer and heating pad for your neck  -you are having an issue with proprioception. Getting Mri and f/u with neuro.

## 2018-11-10 NOTE — Telephone Encounter (Signed)
Unfortunately, our schedule is full today and we are not able to work him in.  Possible for him to see another provider her or at another office, if not, patient will have to come in tomorrow morning or return to the ED.

## 2018-11-10 NOTE — Progress Notes (Signed)
Patient: Jason Weaver MRN: [id number] DOB: 01-23-1956 PCP: Vivi Barrack, MD     Subjective:  Chief Complaint  Patient presents with  . Numbness    Right Arm     HPI: The patient is a 63 y.o. male who presents today for right arm pain and numbness. Seen in ER yesterday with negative CT/EKG and lab work up. He states he woke up yesterday AM around 4am and noticed his right arm was not moving correctly. He felt like it was asleep, but with no tingling. He can move it all around, but it seems disconnected. He states he went to get a drink a water and he really had to concentrate on getting the cup to his mouth. He went to [facility] to make sure no stroke. He states nothing has changed since yesterday. His only thought is he has a stiff neck. He states another example is he was hanging a curtain rod and couldn't get the screw driver to the screw. Again, not a strength issue, but more of a guidance/proprioception issue. He then went down to get a screw and noticed that he felt like he was off balance. He has a slight dizziness. No vision changes, no headaches, no numbness, no tingling. No change with hearing issues. He actually has appointment with Neurology scheduled on 11/28/18. He is right handed.   Review of Systems  Constitutional: Positive for appetite change. Negative for fatigue and fever.  HENT: Positive for hearing loss (no hearing in left ear. baseline. ). Negative for congestion, ear pain, rhinorrhea, sinus pressure, sinus pain and tinnitus.   Eyes: Negative for visual disturbance.  Respiratory: Negative for cough, shortness of breath and wheezing.   Cardiovascular: Negative for chest pain and palpitations.  Gastrointestinal: Negative for abdominal pain, diarrhea, nausea and vomiting.  Musculoskeletal: Positive for neck stiffness. Negative for myalgias and neck pain.  Skin: Negative for rash.  Neurological: Negative for dizziness, seizures, numbness and headaches.   Proprioception issue with left upper extremity     Allergies Patient is allergic to ciprofloxacin.  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  Surgical History Patient  has a past surgical history that includes Hernia repair (2009); Tonsillectomy (1979); Cataract extraction; and Lung biopsy (1997).  Family History Pateint's family history includes Arthritis in his father; Asthma in his sister; COPD in his father; Cancer in his sister.  Social History Patient  reports that he has never smoked. He has never used smokeless tobacco. He reports current alcohol use of about 2.0 standard drinks of alcohol per week. He reports that he does not use drugs.    Objective: Vitals:   11/10/18 1450  BP: 122/78  Pulse: 69  Temp: 97.9 F (36.6 C)  TempSrc: Oral  SpO2: 99%  Weight: 232 lb 3.2 oz (105.3 kg)  Height: 6' (1.829 m)    Body mass index is 31.49 kg/m.  Physical Exam Vitals signs reviewed.  Constitutional:      Appearance: Normal appearance.  HENT:     Head: Normocephalic and atraumatic.     Right Ear: Tympanic membrane, ear canal and external ear normal.     Left Ear: Tympanic membrane, ear canal and external ear normal.  Eyes:     Extraocular Movements: Extraocular movements intact.     Pupils: Pupils are equal, round, and reactive to light.  Neck:     Musculoskeletal: Normal range of motion and neck supple. No neck rigidity or muscular tenderness.     Comments: He has stiffness with head movement to the left and right , but has movement.  Cardiovascular:     Rate and Rhythm: Normal rate and regular rhythm.     Heart sounds: Normal heart sounds.  Pulmonary:     Effort: Pulmonary effort is normal.     Breath sounds: Normal breath sounds.  Abdominal:     General: Abdomen is  flat. Bowel sounds are normal.     Palpations: Abdomen is soft.  Lymphadenopathy:     Cervical: No cervical adenopathy.  Neurological:     General: No focal deficit present.     Mental Status: He is alert and oriented to person, place, and time.     Cranial Nerves: No cranial nerve deficit.     Motor: No weakness.     Coordination: Coordination normal.     Gait: Gait normal.     Deep Tendon Reflexes: Reflexes normal.     Comments: Normal diadochokinesis on right and left UE Sensation intact with normal pin prick Strength 5/5 in bilateral UE Proprioception a little off.    Psychiatric:        Mood and Affect: Mood normal.        Behavior: Behavior normal.        Assessment/plan: 1. Other symptoms and signs involving the nervous system History seems to be a more proprioception issue. Exam reassuring, but he has to try and really think about where he is putting his RUE/hand. I don't feel like this is a neck issue as he has no nerve impingement symptoms. Will do trial of muscle relaxer, heating pad and stretching, but feel like neck issue is likely independent of this; however, they could be related. Will move to MRI of his head to take a closer look. He already has f/u with neuro on 11/28/18 and can discus further with them.  - MR Brain W Wo Contrast; Future  2. Impaired proprioception  See above.   Return if symptoms worsen or fail to improve.     Orma Flaming, MD [place]  11/10/2018

## 2018-11-10 NOTE — Telephone Encounter (Signed)
See note  Copied from [place] [id number]. Topic: Appointment Scheduling - Scheduling Inquiry for Clinic >> Nov 10, 2018  9:50 AM Rutherford Nail, NT wrote: Reason for CRM: Patient calling and states that he was in the ER yesterday (11/09/2018) and states that they didn't do anything for him. States that he is having some upper body muscle memory issues. Offered patient Dr Marigene Ehlers first available hospital follow up appt (11/11/2018 at 8am) and patient states that he "doesn't think he can wait another 24 hours." Please advise.  CB#: [phone]

## 2018-11-11 ENCOUNTER — Ambulatory Visit: Payer: Self-pay

## 2018-11-11 ENCOUNTER — Inpatient Hospital Stay: Payer: BLUE CROSS/BLUE SHIELD | Admitting: Family Medicine

## 2018-11-11 ENCOUNTER — Encounter (HOSPITAL_COMMUNITY): Payer: Self-pay | Admitting: Emergency Medicine

## 2018-11-11 ENCOUNTER — Emergency Department (HOSPITAL_COMMUNITY)
Admission: EM | Admit: 2018-11-11 | Discharge: 2018-11-12 | Discharge status: Against Medical Advice | Payer: BLUE CROSS/BLUE SHIELD

## 2018-11-11 NOTE — ED Triage Notes (Signed)
Pt reports took muscle relaxer this morning around 8am (started yesterday for pinched nerve) and 20 minutes later began having right sided facial numbness.  No neuro deficits, states sensation is the same on both sides of face at this time but still feels "numb".

## 2018-11-11 NOTE — Telephone Encounter (Signed)
Pt called to say that he is having numbness and tingling in the right side of his face. It is only on the right side.He states that he was seen yesterday in the office for stiff neck and difficulty moving his right arm. He was place on a muscle relaxer. He took two doses yesterday and took his morning dose and approximately 30 minutes later started to experience the right side of face numbness and tingling. He denies other neurological symptoms. Per protocol pt will be go to the ER for evaluation of his symptoms. Care advice read to patient. Patient verbalized understanding.  Reason for Disposition . [1] Numbness (i.e., loss of sensation) of the face, arm / hand, or leg / foot on one side of the body AND [2] sudden onset AND [3] brief (now gone)  Answer Assessment - Initial Assessment Questions 1. SYMPTOM: "What is the main symptom you are concerned about?" (e.g., weakness, numbness)     Numbness tinging rt side of face 2. ONSET: "When did this start?" (minutes, hours, days; while sleeping)     This Am after took muscle relaxer 3. LAST NORMAL: "When was the last time you were normal (no symptoms)?"     Last night at bedtime 4. PATTERN "Does this come and go, or has it been constant since it started?"  "Is it present now?"    constant 5. CARDIAC SYMPTOMS: "Have you had any of the following symptoms: chest pain, difficulty breathing, palpitations?"     no 6. NEUROLOGIC SYMPTOMS: "Have you had any of the following symptoms: headache, dizziness, vision loss, double vision, changes in speech, unsteady on your feet?"     no 7. OTHER SYMPTOMS: "Do you have any other symptoms?"     Stiff neckdifficult to move right arm 8. PREGNANCY: "Is there any chance you are pregnant?" "When was your last menstrual period?"    N/A  Protocols used: NEUROLOGIC DEFICIT-A-AH

## 2018-11-12 NOTE — ED Notes (Signed)
Pt wants to leave complaining about wait. Pt encouraged to stay and explained the risks of leaving. Pt refuses. Labels removed. Pt seen leaving ED entrance.

## 2018-11-12 NOTE — Telephone Encounter (Signed)
See note

## 2018-11-12 NOTE — Telephone Encounter (Signed)
FYI

## 2018-11-14 ENCOUNTER — Inpatient Hospital Stay (HOSPITAL_COMMUNITY)
Admission: EM | Admit: 2018-11-14 | Discharge: 2018-11-15 | DRG: 065 | Disposition: A | Discharge status: Home / Self Care | Payer: BLUE CROSS/BLUE SHIELD | Attending: Internal Medicine | Admitting: Internal Medicine

## 2018-11-14 ENCOUNTER — Telehealth: Payer: Self-pay | Admitting: Family Medicine

## 2018-11-14 ENCOUNTER — Encounter (HOSPITAL_COMMUNITY): Payer: Self-pay | Admitting: Family Medicine

## 2018-11-14 ENCOUNTER — Other Ambulatory Visit: Payer: Self-pay

## 2018-11-14 ENCOUNTER — Ambulatory Visit
Admission: RE | Admit: 2018-11-14 | Discharge: 2018-11-14 | Disposition: A | Discharge status: Home / Self Care | Payer: BLUE CROSS/BLUE SHIELD | Source: Ambulatory Visit | Attending: Family Medicine | Admitting: Family Medicine

## 2018-11-14 DIAGNOSIS — I63532 Cerebral infarction due to unspecified occlusion or stenosis of left posterior cerebral artery: Secondary | ICD-10-CM | POA: Diagnosis not present

## 2018-11-14 DIAGNOSIS — G8191 Hemiplegia, unspecified affecting right dominant side: Secondary | ICD-10-CM | POA: Diagnosis present

## 2018-11-14 DIAGNOSIS — R29818 Other symptoms and signs involving the nervous system: Secondary | ICD-10-CM

## 2018-11-14 DIAGNOSIS — I6389 Other cerebral infarction: Secondary | ICD-10-CM | POA: Diagnosis not present

## 2018-11-14 DIAGNOSIS — Z9849 Cataract extraction status, unspecified eye: Secondary | ICD-10-CM | POA: Diagnosis not present

## 2018-11-14 DIAGNOSIS — G4700 Insomnia, unspecified: Secondary | ICD-10-CM | POA: Diagnosis not present

## 2018-11-14 DIAGNOSIS — H918X2 Other specified hearing loss, left ear: Secondary | ICD-10-CM | POA: Diagnosis not present

## 2018-11-14 DIAGNOSIS — I639 Cerebral infarction, unspecified: Secondary | ICD-10-CM | POA: Diagnosis not present

## 2018-11-14 DIAGNOSIS — G4733 Obstructive sleep apnea (adult) (pediatric): Secondary | ICD-10-CM | POA: Diagnosis not present

## 2018-11-14 DIAGNOSIS — J309 Allergic rhinitis, unspecified: Secondary | ICD-10-CM | POA: Diagnosis present

## 2018-11-14 DIAGNOSIS — Z825 Family history of asthma and other chronic lower respiratory diseases: Secondary | ICD-10-CM

## 2018-11-14 DIAGNOSIS — R2689 Other abnormalities of gait and mobility: Secondary | ICD-10-CM | POA: Diagnosis not present

## 2018-11-14 DIAGNOSIS — Z6831 Body mass index (BMI) 31.0-31.9, adult: Secondary | ICD-10-CM

## 2018-11-14 DIAGNOSIS — Z888 Allergy status to other drugs, medicaments and biological substances status: Secondary | ICD-10-CM

## 2018-11-14 DIAGNOSIS — Z8261 Family history of arthritis: Secondary | ICD-10-CM

## 2018-11-14 DIAGNOSIS — E669 Obesity, unspecified: Secondary | ICD-10-CM | POA: Diagnosis not present

## 2018-11-14 DIAGNOSIS — Z9089 Acquired absence of other organs: Secondary | ICD-10-CM

## 2018-11-14 DIAGNOSIS — R29702 NIHSS score 2: Secondary | ICD-10-CM | POA: Diagnosis not present

## 2018-11-14 DIAGNOSIS — I63412 Cerebral infarction due to embolism of left middle cerebral artery: Secondary | ICD-10-CM | POA: Diagnosis not present

## 2018-11-14 DIAGNOSIS — E785 Hyperlipidemia, unspecified: Secondary | ICD-10-CM | POA: Diagnosis not present

## 2018-11-14 DIAGNOSIS — Z87891 Personal history of nicotine dependence: Secondary | ICD-10-CM

## 2018-11-14 DIAGNOSIS — D860 Sarcoidosis of lung: Secondary | ICD-10-CM | POA: Diagnosis not present

## 2018-11-14 DIAGNOSIS — Z79899 Other long term (current) drug therapy: Secondary | ICD-10-CM | POA: Diagnosis not present

## 2018-11-14 DIAGNOSIS — Z86010 Personal history of colonic polyps: Secondary | ICD-10-CM | POA: Diagnosis not present

## 2018-11-14 DIAGNOSIS — R531 Weakness: Secondary | ICD-10-CM | POA: Diagnosis not present

## 2018-11-14 LAB — ETHANOL: Alcohol, Ethyl (B): <10 mg/dL (ref <10)

## 2018-11-14 LAB — APTT: APTT: 35 s (ref 24–36)

## 2018-11-14 LAB — PROTIME-INR
INR: 0.94
Prothrombin Time: 12.5 seconds (ref 11.4–15.2)

## 2018-11-14 LAB — TROPONIN I: Troponin I: <0.03 ng/mL (ref <0.03)

## 2018-11-14 MED ORDER — ACETAMINOPHEN 650 MG RE SUPP: 650.0000 mg | RECTAL | Status: DC | PRN

## 2018-11-14 MED ORDER — GADOBENATE DIMEGLUMINE 529 MG/ML IV SOLN
20.0000 mL | Freq: Once | INTRAVENOUS | Status: AC | PRN
  Administered 2018-11-14: 20 mL via INTRAVENOUS

## 2018-11-14 MED ORDER — ALBUTEROL SULFATE (2.5 MG/3ML) 0.083% IN NEBU: 2.5000 mg | INHALATION_SOLUTION | Freq: Four times a day (QID) | RESPIRATORY_TRACT | Status: DC | PRN

## 2018-11-14 MED ORDER — ACETAMINOPHEN 325 MG PO TABS: 650.0000 mg | ORAL_TABLET | ORAL | Status: DC | PRN

## 2018-11-14 MED ORDER — ZOLPIDEM TARTRATE 5 MG PO TABS: 10.0000 mg | ORAL_TABLET | Freq: Every evening | ORAL | Status: DC | PRN

## 2018-11-14 MED ORDER — ASPIRIN 325 MG PO TABS
325.0000 mg | ORAL_TABLET | Freq: Once | ORAL | Status: AC
  Administered 2018-11-14: 325 mg via ORAL
  Filled 2018-11-14: qty 1

## 2018-11-14 MED ORDER — ACETAMINOPHEN 160 MG/5ML PO SOLN: 650.0000 mg | ORAL | Status: DC | PRN

## 2018-11-14 MED ORDER — STROKE: EARLY STAGES OF RECOVERY BOOK
Freq: Once | Status: AC
  Administered 2018-11-14: 22:00:00
  Filled 2018-11-14 (×2): qty 1

## 2018-11-14 MED ORDER — ENOXAPARIN SODIUM 40 MG/0.4ML ~~LOC~~ SOLN
40.0000 mg | SUBCUTANEOUS | Status: DC
  Administered 2018-11-14: 40 mg via SUBCUTANEOUS
  Filled 2018-11-14 (×2): qty 0.4

## 2018-11-14 MED ORDER — SENNOSIDES-DOCUSATE SODIUM 8.6-50 MG PO TABS: 1.0000 | ORAL_TABLET | Freq: Every evening | ORAL | Status: DC | PRN

## 2018-11-14 NOTE — ED Provider Notes (Signed)
[facility] DEPT Provider Note   CSN: [id number] Arrival date & time: 11/14/18  1642     History   Chief Complaint Chief Complaint  Patient presents with  . Weakness    Confirmed MRI stroke    HPI Jason Weaver is a 63 y.o. male.  HPI Patient presents after an MRI proven stroke.  Today is Friday and on Sunday began to have difficulty using his right arm.  Was seen in the ER and had a negative CT and was discharged for outpatient follow-up.  Came back to the ER and had seen primary care.  Primary care started muscle relaxer thinking it could have been cervical cause but also ordered MRI.  MRI done and does show a subacute stroke.  States the arm is actually little improved today.  States it was more difficulty just using it.  Had good strength.  Just a little difficulty with coordination.  No headache.  No confusion.  No chest pain or trouble breathing.  No difficulty walking.  States that he did have problems with his left ear previously years ago without a clear cause and I thought it could have been either related to medicines or stroke.  Patient is right-handed. Past Medical History:  Diagnosis Date  . Diverticulosis of colon   . ED (erectile dysfunction)   . Gout   . Hearing loss   . Hx of colonic polyps   . Hyperlipidemia   . Nasal septal deviation   . Obesity   . Pulmonary sarcoidosis ([place])  1997    biopsy proven    Patient Active Problem List   Diagnosis Date Noted  . CVA (cerebrovascular accident) ([place]) 11/14/2018  . Numbness and tingling of foot 10/20/2018  . Insomnia 10/20/2018  . Erectile dysfunction 10/20/2018  . Anal fissure 10/20/2018  . Cold sore 10/20/2018  . Arthritis of ankle or foot, degenerative, left 04/20/2014  . Allergic rhinitis 05/29/2012  . OSA (obstructive sleep apnea) 06/04/2011  . Pulmonary sarcoidosis ([place]) 01/11/2011  . Gout 11/11/2009  . Diverticulosis of large intestine 11/11/2009  . History of colonic  polyps 11/11/2009  . Dyslipidemia with statin intolerance 10/24/2009  . Deafness, left 08/23/2009    Past Surgical History:  Procedure Laterality Date  . CATARACT EXTRACTION    . HERNIA REPAIR  2009  . LUNG BIOPSY  1997  . TONSILLECTOMY  1979        Home Medications    Prior to Admission medications   Medication Sig Start Date End Date Taking? Authorizing Provider  Ascorbic Acid (VITAMIN C) 1000 MG tablet Take 1,000 mg by mouth daily.   Yes [provider]  Cyanocobalamin (VITAMIN B 12 PO) Take 1 tablet by mouth daily.   Yes [provider]  diphenhydrAMINE (BENADRYL) 25 MG tablet Take 25 mg by mouth every 6 (six) hours as needed for allergies.   Yes [provider]  Pyridoxine HCl (VITAMIN B-6 PO) Take 1 tablet by mouth daily.   Yes [provider]  vitamin E 100 UNIT capsule Take 100 Units by mouth daily.   Yes [provider]  zolpidem (AMBIEN) 10 MG tablet Take 1 tablet (10 mg total) by mouth at bedtime as needed for up to 30 days for sleep. 11/05/18 12/05/18 Yes Vivi Barrack, MD  albuterol [facility] HFA) [phone] Base) MCG/ACT inhaler INHALE 2 PUFFS INTO THE LUNGS EVERY 6 (SIX) HOURS AS NEEDED FOR WHEEZING. 09/18/17   Marletta Lor, MD  hydrocortisone (ANUSOL-HC) 2.5 % rectal cream Place rectally 2 (two) times daily. Patient taking differently: Place 1 application rectally 2 (two) times daily as needed for hemorrhoids (itching).  11/04/12   Marletta Lor, MD  metroNIDAZOLE (FLAGYL) 500 MG tablet Take 1 tablet (500 mg total) by mouth 3 (three) times daily. Patient taking differently: Take 500 mg by mouth 3 (three) times daily as needed (flareup).  10/20/18   Vivi Barrack, MD  sildenafil (REVATIO) 20 MG tablet Take 1-5 tablets (20-100 mg total) by mouth daily as needed (erectile dysfunction). 10/20/18   Vivi Barrack, MD  valACYclovir (VALTREX) 1000 MG tablet Take 1 g by mouth as directed. Take 1 tablet daily for 3-4 days  as needed for flare ups 05/04/16   [provider]    Family History Family History  Problem Relation Age of Onset  . COPD Father        was a smoker  . Arthritis Father   . Asthma Sister   . Cancer Sister     Social History Social History   Tobacco Use  . Smoking status: Never Smoker  . Smokeless tobacco: Never Used  Substance Use Topics  . Alcohol use: Yes    Alcohol/week: 2.0 standard drinks    Types: 2 Glasses of wine per week  . Drug use: Never     Allergies   Ciprofloxacin   Review of Systems Review of Systems  Constitutional: Negative for appetite change.  HENT: Negative for congestion.   Respiratory: Negative for chest tightness.   Cardiovascular: Negative for chest pain.  Gastrointestinal: Negative for abdominal distention.  Genitourinary: Negative for flank pain.  Musculoskeletal: Negative for back pain.  Neurological: Negative for syncope, facial asymmetry, speech difficulty, light-headedness and headaches.       Difficulty with coordination.  Psychiatric/Behavioral: Negative for confusion.     Physical Exam Updated Vital Signs BP 114/78 (BP Location: Left Arm)   Pulse 65   Temp 98 F (36.7 C) (Oral)   Resp 18   Ht 6' (1.829 m)   Wt 104.3 kg   SpO2 96%   BMI 31.19 kg/m   Physical Exam Constitutional:      Appearance: Normal appearance.  HENT:     Head: Atraumatic.     Mouth/Throat:     Mouth: Mucous membranes are moist.  Eyes:     Extraocular Movements: Extraocular movements intact.  Neck:     Musculoskeletal: Normal range of motion and neck supple.  Cardiovascular:     Rate and Rhythm: Regular rhythm.  Pulmonary:     Effort: Pulmonary effort is normal.  Abdominal:     General: Abdomen is flat.  Skin:    General: Skin is warm.     Capillary Refill: Capillary refill takes less than 2 seconds.  Neurological:     Mental Status: He is alert and oriented to person, place, and time.     Comments: Finger-nose intact  bilaterally.  Heel shin intact bilaterally.  Regular sensation over radial median ulnar distribution.  Good strength to flexion and extension in hands wrist elbows and shoulders.  Face symmetric.  Normal speech.      ED Treatments / Results  Labs (all labs ordered are listed, but only abnormal results are displayed) Labs Reviewed  ETHANOL  PROTIME-INR  APTT  TROPONIN I  HIV ANTIBODY (ROUTINE TESTING W REFLEX)  HEMOGLOBIN A1C  LIPID PANEL    EKG None  Radiology [name] Wo Contrast  Result  Date: 11/14/2018 CLINICAL DATA:  63 y/o M; Neuro deficit(s), subacute proprioception issue on right UE. Stroke symptoms since 11/09/2018. EXAM: MRI HEAD WITHOUT AND WITH CONTRAST TECHNIQUE: Multiplanar, multiecho pulse sequences of the brain and surrounding structures were obtained without and with intravenous contrast. CONTRAST:  51mL MULTIHANCE GADOBENATE DIMEGLUMINE 529 MG/ML IV SOLN COMPARISON:  11/09/2018 CT head.  02/25/2008 MRI of the head. FINDINGS: Brain: Group of small foci of mildly reduced diffusion, stippled enhancement, and T2 FLAIR hyperintense signal spanning 7 x 16 mm (AP by ML series 6, image 83) within the left superior precentral gyrus compatible with early subacute infarction. Additional punctate focus of subacute infarction is present within the left anterosuperior frontal cortex (series 6, image 78). No associated hemorrhage or mass effect. Scattered punctate nonspecific T2 FLAIR hyperintensities in subcortical and periventricular white matter are compatible with mild chronic microvascular ischemic changes. Mild volume loss of the brain. Mild progression of volume loss and microvascular ischemic changes from 2009. No extra-axial collection, hydrocephalus, mass effect, or herniation. Vascular: Normal flow voids. Skull and upper cervical spine: Normal marrow signal. Sinuses/Orbits: Mild diffuse paranasal sinus mucosal thickening. No abnormal signal of mastoid air cells. Right  intra-ocular lens replacement. Other: None. IMPRESSION: 1. Small early subacute infarction within the left superior precentral gyrus and additional punctate focus in the left anterosuperior frontal cortex. No associated hemorrhage or mass effect. 2. Mild chronic microvascular ischemic changes and volume loss of the brain. Mild progression from 2009. These results will be called to the ordering clinician or representative by the Radiologist Assistant, and communication documented in the PACS or zVision Dashboard. Electronically Signed   By: Kristine Garbe M.D.   On: 11/14/2018 14:49    Procedures Procedures (including critical care time)  Medications Ordered in ED Medications  acetaminophen (TYLENOL) tablet 650 mg (has no administration in time range)    Or  acetaminophen (TYLENOL) solution 650 mg (has no administration in time range)    Or  acetaminophen (TYLENOL) suppository 650 mg (has no administration in time range)  senna-docusate (Senokot-S) tablet 1 tablet (has no administration in time range)  enoxaparin (LOVENOX) injection 40 mg (40 mg Subcutaneous Given 11/14/18 2200)  albuterol (PROVENTIL) (2.5 MG/3ML) 0.083% nebulizer solution 2.5 mg (has no administration in time range)  zolpidem (AMBIEN) tablet 10 mg (has no administration in time range)  aspirin tablet 325 mg (325 mg Oral Given 11/14/18 1748)   stroke: mapping our early stages of recovery book ( Does not apply Given 11/14/18 2201)     Initial Impression / Assessment and Plan / ED Course  I have reviewed the triage vital signs and the nursing notes.  Pertinent labs & imaging results that were available during my care of the patient were reviewed by me and considered in my medical decision making (see chart for details).     Patient with subacute stroke.  Symptoms started on Sunday.  MRI today showed stroke.  Discussed with Dr. Cheral Marker from neurology.  Will transfer to [facility] for further work-up.  Labs reviewed  from 5 days ago and 9 days ago.  Will add some but appears stable for admission to hospital.  Admit to hospitalist  Final Clinical Impressions(s) / ED Diagnoses   Final diagnoses:  Cerebrovascular accident (CVA), unspecified mechanism [facility])    ED Discharge Orders    None       Davonna Belling, MD 11/15/18 0010

## 2018-11-14 NOTE — ED Notes (Signed)
Attempted to call report x 1  

## 2018-11-14 NOTE — Progress Notes (Signed)
Please inform patient of the following:  His MRI showed that he had a stroke. Think that this is the reason for his symptoms. Recommend that he go to the ED for stroke evaluation.  Jason Weaver. Jerline Pain, MD 11/14/2018 3:36 PM

## 2018-11-14 NOTE — ED Notes (Signed)
ED TO INPATIENT HANDOFF REPORT  Name/Age/Gender Jason Weaver 63 y.o. male  Code Status   Home/SNF/Other Home  Chief Complaint stroke  Level of Care/Admitting Diagnosis ED Disposition    ED Disposition Condition Comment   Admit  Hospital Area: [facility] [[id number]]  Level of Care: Medical Telemetry [104]  Diagnosis: CVA (cerebrovascular accident) [facility]) [[id number]]  Admitting Physician: Patrecia Pour [phone]  Attending Physician: Patrecia Pour [phone]  Estimated length of stay: past midnight tomorrow  Certification:: I certify this patient will need inpatient services for at least 2 midnights  PT Class (Do Not Modify): Inpatient [101]  PT Acc Code (Do Not Modify): Private [1]       Medical History Past Medical History:  Diagnosis Date  . Diverticulosis of colon   . ED (erectile dysfunction)   . Gout   . Hearing loss   . Hx of colonic polyps   . Hyperlipidemia   . Nasal septal deviation   . Obesity   . Pulmonary sarcoidosis ([place])  1997    biopsy proven    Allergies Allergies  Allergen Reactions  . Ciprofloxacin Rash    IV Location/Drains/Wounds Patient Lines/Drains/Airways Status   Active Line/Drains/Airways    None          Labs/Imaging No results found for this or any previous visit (from the past 48 hour(s)). Jason Weaver Wo Contrast  Result Date: 11/14/2018 CLINICAL DATA:  63 y/o M; Neuro deficit(s), subacute proprioception issue on right UE. Stroke symptoms since 11/09/2018. EXAM: MRI HEAD WITHOUT AND WITH CONTRAST TECHNIQUE: Multiplanar, multiecho pulse sequences of the brain and surrounding structures were obtained without and with intravenous contrast. CONTRAST:  69mL MULTIHANCE GADOBENATE DIMEGLUMINE 529 MG/ML IV SOLN COMPARISON:  11/09/2018 CT head.  02/25/2008 MRI of the head. FINDINGS: Brain: Group of small foci of mildly reduced diffusion, stippled enhancement, and T2 FLAIR hyperintense signal spanning 7 x 16 mm (AP by ML series  6, image 83) within the left superior precentral gyrus compatible with early subacute infarction. Additional punctate focus of subacute infarction is present within the left anterosuperior frontal cortex (series 6, image 78). No associated hemorrhage or mass effect. Scattered punctate nonspecific T2 FLAIR hyperintensities in subcortical and periventricular white matter are compatible with mild chronic microvascular ischemic changes. Mild volume loss of the brain. Mild progression of volume loss and microvascular ischemic changes from 2009. No extra-axial collection, hydrocephalus, mass effect, or herniation. Vascular: Normal flow voids. Skull and upper cervical spine: Normal marrow signal. Sinuses/Orbits: Mild diffuse paranasal sinus mucosal thickening. No abnormal signal of mastoid air cells. Right intra-ocular lens replacement. Other: None. IMPRESSION: 1. Small early subacute infarction within the left superior precentral gyrus and additional punctate focus in the left anterosuperior frontal cortex. No associated hemorrhage or mass effect. 2. Mild chronic microvascular ischemic changes and volume loss of the brain. Mild progression from 2009. These results will be called to the ordering clinician or representative by the Radiologist Assistant, and communication documented in the PACS or zVision Dashboard. Electronically Signed   By: Kristine Garbe M.D.   On: 11/14/2018 14:49   None  Pending Labs Unresulted Labs (From admission, onward)    Start     Ordered   11/14/18 1704  Ethanol  ONCE - STAT,   STAT     11/14/18 1703   11/14/18 1704  Protime-INR  (Stroke Panel (PNL))  ONCE - STAT,   STAT     11/14/18 1703   11/14/18 1704  APTT  (Stroke Panel (PNL))  ONCE - STAT,   STAT     11/14/18 1703   11/14/18 1704  Urine rapid drug screen (hosp performed)  ONCE - STAT,   STAT     11/14/18 1703   11/14/18 1704  Urinalysis, Routine w reflex microscopic  ONCE - STAT,   STAT     11/14/18 1703           Vitals/Pain Today's Vitals   11/14/18 1651 11/14/18 1700  BP:  (!) 143/98  Pulse:  76  Resp:  19  Temp:  98.2 F (36.8 C)  TempSrc:  Oral  SpO2:  97%  Weight: 104.3 kg 104.3 kg  Height: 6' (1.829 m) 6' (1.829 m)    Isolation Precautions No active isolations  Medications Medications  aspirin tablet 325 mg (has no administration in time range)    Mobility walks

## 2018-11-14 NOTE — ED Notes (Signed)
Pt denies any weakness, is able to ambulate, pt sts the back of his head feels cool and tingling. #20 ga IV started right fore arm x 1 attempt.

## 2018-11-14 NOTE — Telephone Encounter (Signed)
We have received radiology results.

## 2018-11-14 NOTE — ED Triage Notes (Signed)
Pt reports he has been having stroke like symptoms since last Sunday morning. Pt reports he was having strange motion problems with his right arm and has been having facial numbness and other symptoms. Pt reports he had a CT scan that did not show a stroke. Pt reports he went and had an MRI and was confirmed for stroke today.

## 2018-11-14 NOTE — H&P (Signed)
History and Physical   Jason Weaver QIO:[id number] DOB: 10/26/1955 DOA: 11/14/2018  Referring MD/NP/PA: Dr. Alvino Chapel, EDP PCP: Vivi Barrack, MD Outpatient Specialists: Dr. Paulla Fore, Sports Medicine; Dr. Girard Cooter, Pulmonology; Dr. Shelia Media, Neurology Patient coming from: Home  Chief Complaint: Right arm neurological deficits with confirmed stroke on MRI  HPI: Jason Weaver is a RHD 63 y.o. male with a history of pulmonary sarcoidosis, dyslipidemia, obesity, OSA not on CPAP who presented to the ED on the advice of PCP after outpatient MRI showed early subacute strokes. He woke up 5 days ago (1/19) to an abnormal heaviness to the right arm with concomitant loss of dexterity that was constant, not changed with movements, and abrupt for which he presented to the ED where CT head was negative. Due to persistent symptoms he presented to his PCP who's office who felt he had proprioceptive deficits, prescribed trial of antispasmodic and ordered MRI. Symptoms persisted, constant, waxing and waning (seemed worst in the early mornings), no better or worse with medications and he called to expedite MRI to today where small foci of infarcts appearing early sub-acute in the left superior precentral gyrus and left anterosuperior frontal cortex. He was urged to present to the ED where subtle deficit in coordination in the RUE was noted. Neurologist contacted, recommended hospitalist admission and transfer to [facility] for stroke neurology evaluation.   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 All others reviewed and are negative.   Past Medical History:    Diagnosis Date  . Diverticulosis of colon   . ED (erectile dysfunction)   . Gout   . Hearing loss   . Hx of colonic polyps   . Hyperlipidemia   . Nasal septal deviation   . Obesity   . Pulmonary sarcoidosis ([place])  1997    biopsy proven   Past Surgical History:  Procedure Laterality Date  . CATARACT EXTRACTION    . HERNIA REPAIR  2009  . LUNG BIOPSY  1997  . TONSILLECTOMY  1979   - Social alcohol use, remote h/o tobacco use, none recently. Lives with wife, keeps an active lifestyle.  Allergies  Allergen Reactions  . Ciprofloxacin Rash   Family History  Problem Relation Age of Onset  . COPD Father        was a smoker  . Arthritis Father   . Asthma Sister   . Cancer Sister    - Family history otherwise reviewed and not pertinent.  Prior to Admission medications   Medication Sig Start Date End Date Taking? Authorizing Provider  Ascorbic Acid (VITAMIN C) 1000 MG tablet Take 1,000 mg by mouth daily.   Yes [provider]  Cyanocobalamin (VITAMIN B 12 PO) Take 1 tablet by mouth daily.   Yes [provider]  diphenhydrAMINE (BENADRYL) 25 MG tablet Take 25 mg by mouth every 6 (six) hours as needed for allergies.   Yes [provider]  Pyridoxine HCl (VITAMIN B-6 PO) Take 1 tablet by mouth daily.   Yes [provider]  vitamin E 100 UNIT capsule Take 100 Units by mouth daily.   Yes [provider]  zolpidem (AMBIEN) 10 MG tablet Take 1 tablet (10 mg total)  by mouth at bedtime as needed for up to 30 days for sleep. 11/05/18 12/05/18 Yes Vivi Barrack, MD  albuterol [facility] HFA) 108 [phone] Base) MCG/ACT inhaler INHALE 2 PUFFS INTO THE LUNGS EVERY 6 (SIX) HOURS AS NEEDED FOR WHEEZING. 09/18/17   Marletta Lor, MD  hydrocortisone (ANUSOL-HC) 2.5 % rectal cream Place rectally 2 (two) times daily. Patient taking differently: Place 1 application rectally 2 (two) times daily as needed for hemorrhoids (itching).  11/04/12   Marletta Lor, MD  metroNIDAZOLE (FLAGYL) 500 MG tablet Take 1 tablet (500 mg total) by mouth 3 (three) times daily. Patient taking differently: Take 500 mg by mouth 3 (three) times daily as needed (flareup).  10/20/18   Vivi Barrack, MD  sildenafil (REVATIO) 20 MG tablet Take 1-5 tablets (20-100 mg total) by mouth daily as needed (erectile dysfunction). 10/20/18   Vivi Barrack, MD  valACYclovir (VALTREX) 1000 MG tablet Take 1 g by mouth as directed. Take 1 tablet daily for 3-4 days as needed for flare ups 05/04/16   [provider]   Physical Exam: Vitals:   11/14/18 1651 11/14/18 1700  BP:  (!) 143/98  Pulse:  76  Resp:  19  Temp:  98.2 F (36.8 C)  TempSrc:  Oral  SpO2:  97%  Weight: 104.3 kg 104.3 kg  Height: 6' (1.829 m) 6' (1.829 m)   Constitutional: 63 y.o. male in no distress, calm demeanor Eyes: Lids and conjunctivae normal, PERRL ENMT: Mucous membranes are moist. Rightward septal deviation. Posterior pharynx clear of any exudate or lesions. Fair dentition. TMs grey, normal position bilaterally. Neck: Supple, no masses, no thyromegaly Respiratory: Non-labored breathing room air without accessory muscle use. Clear breath sounds to auscultation bilaterally Cardiovascular: Regular rate and rhythm, no murmurs, rubs, or gallops. No carotid bruits. No JVD. No LE edema. + distal pulses. Abdomen: Normoactive bowel sounds. No tenderness, non-distended, and no masses palpated. No hepatosplenomegaly. GU: No indwelling catheter Musculoskeletal: No clubbing / cyanosis. No joint deformity upper and lower extremities. Good ROM, no contractures. Normal muscle tone.  Skin: Warm, dry. No rashes, wounds, or ulcers. No significant lesions noted.  Neurologic: CN II-XII grossly intact, visual fields full to confrontation. Right nasal bridge deviation, but no other facial asymmetry. Gait narrow-based. Speech without aphasia or dysarthria. Strength 5/5 in extremities x4, no dysdiadochokinesia  in UE's or dysmetria in 4 extremities. Reported heavy/abnormal sensation worst in distal right forearm and radial digits/palm. Psychiatric: Alert and oriented x3. Normal judgment and insight. Mood euthymic with congruent affect.   Labs on Admission: I have personally reviewed following labs and imaging studies from prior visits.  CBC: Recent Labs  Lab 11/09/18 0602  WBC 5.4  NEUTROABS 3.6  HGB 16.5  HCT 48.0  MCV 90.7  PLT 267   Basic Metabolic Panel: Recent Labs  Lab 11/09/18 0602  NA 137  K 4.1  CL 107  CO2 22  GLUCOSE 92  BUN 16  CREATININE 0.88  CALCIUM 9.7   Liver Function Tests: Recent Labs  Lab 11/09/18 0602  AST 23  ALT 33  ALKPHOS 61  BILITOT 1.0  PROT 7.4  ALBUMIN 4.5   Radiological Exams on Admission: [name] Wo Contrast  Result Date: 11/14/2018 CLINICAL DATA:  63 y/o M; Neuro deficit(s), subacute proprioception issue on right UE. Stroke symptoms since 11/09/2018. EXAM: MRI HEAD WITHOUT AND WITH CONTRAST TECHNIQUE: Multiplanar, multiecho pulse sequences of the brain and surrounding structures were obtained without and with  intravenous contrast. CONTRAST:  66mL MULTIHANCE GADOBENATE DIMEGLUMINE 529 MG/ML IV SOLN COMPARISON:  11/09/2018 CT head.  02/25/2008 MRI of the head. FINDINGS: Brain: Group of small foci of mildly reduced diffusion, stippled enhancement, and T2 FLAIR hyperintense signal spanning 7 x 16 mm (AP by ML series 6, image 83) within the left superior precentral gyrus compatible with early subacute infarction. Additional punctate focus of subacute infarction is present within the left anterosuperior frontal cortex (series 6, image 78). No associated hemorrhage or mass effect. Scattered punctate nonspecific T2 FLAIR hyperintensities in subcortical and periventricular white matter are compatible with mild chronic microvascular ischemic changes. Mild volume loss of the brain. Mild progression of volume loss and microvascular ischemic changes from  2009. No extra-axial collection, hydrocephalus, mass effect, or herniation. Vascular: Normal flow voids. Skull and upper cervical spine: Normal marrow signal. Sinuses/Orbits: Mild diffuse paranasal sinus mucosal thickening. No abnormal signal of mastoid air cells. Right intra-ocular lens replacement. Other: None. IMPRESSION: 1. Small early subacute infarction within the left superior precentral gyrus and additional punctate focus in the left anterosuperior frontal cortex. No associated hemorrhage or mass effect. 2. Mild chronic microvascular ischemic changes and volume loss of the brain. Mild progression from 2009. These results will be called to the ordering clinician or representative by the Radiologist Assistant, and communication documented in the PACS or zVision Dashboard. Electronically Signed   By: Kristine Garbe M.D.   On: 11/14/2018 14:49    EKG: Independently reviewed. NSR with minimal artifact in early tracing. No ischemic changes or typical LVH findings.  Assessment/Plan Active Problems:   CVA (cerebrovascular accident) ([place])   Early subacute CVA: - Cardiac monitoring, denies any palpitations. - 2D echocardiogram  - Carotid dopplers  - Neuro checks per protocol - PT/OT/SLP ordered - Aspirin given, defer further antiplatelet recommendations to neurology, none PTA.  - Permissive HTN - Plan to start high-intensity statin if can tolerate (reported statin intolerance in EMR, though none in allergies). Checking FLP and A1c in AM.   Healthcare maintenance:  - Check HIV in AM  Dyslipidemia:  - Checking FLP and A1c in AM.   Pulmonary sarcoidosis: No flare.  - Continue prn albuterol  OSA: Denies using CPAP recently.  - Recommend formal sleep study as outpatient. Check for pulm HTN on echo.  Insomnia:  - Continue home ambien  DVT prophylaxis: Lovenox  Code Status: Full , confirmed at admission Family Communication: Wife at bedside Disposition Plan: Home once work up  completed. Consults called: Neurology, Dr. Cheral Marker by EDP  Admission status: Inpatient due to confirmed stroke.    Patrecia Pour, MD [facility] [email or web address] Password [facility] 11/14/2018, 5:56 PM

## 2018-11-14 NOTE — Telephone Encounter (Signed)
See note  Copied from [place] [id number]. Topic: General - Other >> Nov 14, 2018  3:04 PM Keene Breath wrote: Reason for CRM: Olivia Mackie with [facility] call to give MRI results.  CB# [phone]

## 2018-11-15 ENCOUNTER — Inpatient Hospital Stay (HOSPITAL_COMMUNITY): Payer: BLUE CROSS/BLUE SHIELD

## 2018-11-15 DIAGNOSIS — Z9989 Dependence on other enabling machines and devices: Secondary | ICD-10-CM

## 2018-11-15 DIAGNOSIS — I6389 Other cerebral infarction: Secondary | ICD-10-CM

## 2018-11-15 DIAGNOSIS — I639 Cerebral infarction, unspecified: Secondary | ICD-10-CM

## 2018-11-15 DIAGNOSIS — G4733 Obstructive sleep apnea (adult) (pediatric): Secondary | ICD-10-CM

## 2018-11-15 DIAGNOSIS — I63412 Cerebral infarction due to embolism of left middle cerebral artery: Principal | ICD-10-CM

## 2018-11-15 LAB — CREATININE, SERUM
CREATININE: 0.96 mg/dL (ref 0.61–1.24)
GFR calc Af Amer: >60 mL/min (ref >60)

## 2018-11-15 LAB — HEMOGLOBIN A1C
Hgb A1c MFr Bld: 4.9 % (ref 4.8–5.6)
Mean Plasma Glucose: 93.93 mg/dL

## 2018-11-15 LAB — ECHOCARDIOGRAM COMPLETE
HEIGHTINCHES: 72 in
Weight: 3680 oz

## 2018-11-15 MED ORDER — METRONIDAZOLE 500 MG PO TABS: 500.0000 mg | ORAL_TABLET | Freq: Three times a day (TID) | ORAL | Status: DC | PRN

## 2018-11-15 MED ORDER — EZETIMIBE 10 MG PO TABS: 10.0000 mg | ORAL_TABLET | Freq: Every day | ORAL | Status: DC

## 2018-11-15 MED ORDER — ATORVASTATIN CALCIUM 40 MG PO TABS: 40.0000 mg | ORAL_TABLET | Freq: Every day | ORAL | 0 refills | Status: DC

## 2018-11-15 MED ORDER — ATORVASTATIN CALCIUM 40 MG PO TABS: 40.0000 mg | ORAL_TABLET | Freq: Every day | ORAL | Status: DC

## 2018-11-15 MED ORDER — ASPIRIN EC 81 MG PO TBEC
81.0000 mg | DELAYED_RELEASE_TABLET | Freq: Every day | ORAL | Status: DC
  Administered 2018-11-15: 81 mg via ORAL
  Filled 2018-11-15: qty 1

## 2018-11-15 MED ORDER — HYDROCORTISONE 2.5 % RE CREA: 1.0000 "application " | TOPICAL_CREAM | Freq: Two times a day (BID) | RECTAL | Status: DC | PRN

## 2018-11-15 MED ORDER — CLOPIDOGREL BISULFATE 75 MG PO TABS: 75.0000 mg | ORAL_TABLET | Freq: Every day | ORAL | Status: DC

## 2018-11-15 MED ORDER — CLOPIDOGREL BISULFATE 75 MG PO TABS: 75.0000 mg | ORAL_TABLET | Freq: Every day | ORAL | 0 refills | Status: DC

## 2018-11-15 MED ORDER — CLOPIDOGREL BISULFATE 75 MG PO TABS: 300.0000 mg | ORAL_TABLET | Freq: Once | ORAL | Status: DC

## 2018-11-15 MED ORDER — CLOPIDOGREL BISULFATE 75 MG PO TABS
75.0000 mg | ORAL_TABLET | Freq: Every day | ORAL | Status: DC
  Administered 2018-11-15: 75 mg via ORAL
  Filled 2018-11-15: qty 1

## 2018-11-15 MED ORDER — IOPAMIDOL (ISOVUE-370) INJECTION 76%
INTRAVENOUS | Status: AC
  Administered 2018-11-15: 75 mL
  Filled 2018-11-15: qty 50

## 2018-11-15 MED ORDER — ASPIRIN 81 MG PO TBEC: 81.0000 mg | DELAYED_RELEASE_TABLET | Freq: Every day | ORAL | 0 refills | Status: DC

## 2018-11-15 NOTE — Progress Notes (Signed)
  Echocardiogram 2D Echocardiogram has been performed.  Merrie Roof F 11/15/2018, 12:15 PM

## 2018-11-15 NOTE — Progress Notes (Signed)
STROKE TEAM PROGRESS NOTE   SUBJECTIVE (INTERVAL HISTORY) No family is at the bedside.  Patient back to baseline.  MRI with left MCA scattered infarcts, concerning for cardioembolic source.  Patient eager to go home and declined inpatient work-up with TEE and loop recorder.  Will set up for outpatient TEE and loop recorder.    OBJECTIVE Vitals:   11/14/18 2201 11/14/18 2325 11/15/18 0200 11/15/18 0414  BP: 124/74 114/78 120/84 114/76  Pulse: 64 65 (!) 52 (!) 53  Resp:      Temp:  98 F (36.7 C)  97.7 F (36.5 C)  TempSrc:  Oral  Oral  SpO2:  96%  97%  Weight:      Height:        CBC:  Recent Labs  Lab 11/09/18 0602  WBC 5.4  NEUTROABS 3.6  HGB 16.5  HCT 48.0  MCV 90.7  PLT 035    Basic Metabolic Panel:  Recent Labs  Lab 11/09/18 0602  NA 137  K 4.1  CL 107  CO2 22  GLUCOSE 92  BUN 16  CREATININE 0.88  CALCIUM 9.7    Lipid Panel:     Component Value Date/Time   CHOL 253 (H) 11/05/2018 0837   TRIG 239.0 (H) 11/05/2018 0837   HDL 45.00 11/05/2018 0837   CHOLHDL 6 11/05/2018 0837   VLDL 47.8 (H) 11/05/2018 0837   HgbA1c: No results found for: HGBA1C Urine Drug Screen: No results found for: LABOPIA, COCAINSCRNUR, LABBENZ, AMPHETMU, THCU, LABBARB  Alcohol Level     Component Value Date/Time   [facility] <10 11/14/2018 1657    IMAGING   [name] Wo Contrast 11/14/2018 IMPRESSION:  1. Small early subacute infarction within the left superior precentral gyrus and additional punctate focus in the left anterosuperior frontal cortex. No associated hemorrhage or mass effect.  2. Mild chronic microvascular ischemic changes and volume loss of the brain. Mild progression from 2009.   CTA HEAD AND NECK 11/15/2018 IMPRESSION: Mild atherosclerosis without flow limiting stenosis or evident embolic source in the head and neck.  Transthoracic Echocardiogram  - Left ventricle: The cavity size was normal. Wall thickness was   normal. Systolic function was normal. The  estimated ejection   fraction was in the range of 55% to 60%. Wall motion was normal;   there were no regional wall motion abnormalities. Impressions: - Normal LV systolic function; probable mild diastolic dysfunction;   trace MR and TR.   Bilateral LE Venous Dopplers  11/14/2018 Summary: Right: There is no evidence of deep vein thrombosis in the lower extremity. No cystic structure found in the popliteal fossa. Left: There is no evidence of deep vein thrombosis in the lower extremity. No cystic structure found in the popliteal fossa.   PHYSICAL EXAM  Temp:  [97.7 F (36.5 C)-98.2 F (36.8 C)] 98 F (36.7 C) (01/25 1139) Pulse Rate:  [52-76] 63 (01/25 1139) Resp:  [16-19] 16 (01/25 1139) BP: (114-143)/(73-98) 125/82 (01/25 1139) SpO2:  [94 %-97 %] 96 % (01/25 1139) Weight:  [104.3 kg] 104.3 kg (01/24 1700)  General - Well nourished, well developed, in no apparent distress.  Ophthalmologic - fundi not visualized due to noncooperation.  Cardiovascular - Regular rate and rhythm.  Mental Status -  Level of arousal and orientation to time, place, and person were intact. Language including expression, naming, repetition, comprehension was assessed and found intact. Fund of Knowledge was assessed and was intact.  Cranial Nerves II - XII - II -  Visual field intact OU. III, IV, VI - Extraocular movements intact. V - Facial sensation intact bilaterally. VII - Facial movement intact bilaterally. VIII - Hearing & vestibular intact bilaterally. X - Palate elevates symmetrically. XI - Chin turning & shoulder shrug intact bilaterally. XII - Tongue protrusion intact.  Motor Strength - The patient's strength was normal in all extremities and pronator drift was absent.  Bulk was normal and fasciculations were absent.   Motor Tone - Muscle tone was assessed at the neck and appendages and was normal.  Reflexes - The patient's reflexes were symmetrical in all extremities and he had no  pathological reflexes.  Sensory - Light touch, temperature/pinprick were assessed and were symmetrical.    Coordination - The patient had normal movements in the hands and feet with no ataxia or dysmetria.  Tremor was absent.  Gait and Station - deferred.    ASSESSMENT/PLAN Jason Weaver is a 63 y.o. male with history of hyperlipidemia, pulmonary sarcoidosis, OSA on CPAP, and obesity  presenting with right arm appeared heavier than normal and there was loss of dexterity. He did not receive IV t-PA due to late presentation.  Stroke:  left MCA scattered infarcts - embolic pattern - source unknown, concerning for cardioembolic source  CT head  - no acute findings.  MRI head - Small early subacute infarction within the left superior precentral gyrus and additional punctate focus in the left anterosuperior frontal cortex.  CTA H&N - Mild atherosclerosis without flow limiting stenosis or evident embolic source in the head and neck.  Bilateral LE Venous Dopplers - negative for DVT  2D Echo EF 55 to 60%  Recommend TEE and loop recorder to evaluate for cardioembolic source.  Patient declined inpatient TEE and loop recorder but agreed with outpatient TEE and loop recorder.  Discussed with Dr. Algis Liming, will set up with Dr. Einar Gip as outpatient to be done ASAP.  LDL -207  HgbA1c -4.9  VTE prophylaxis - Lovenox  Diet  - Heart healthy with thin liquids.  No antithrombotic prior to admission, now on aspirin 81 mg daily and clopidogrel 75 mg daily.  Continue DAPT for 3 weeks and then aspirin alone.  Patient counseled to be compliant with his antithrombotic medications  Ongoing aggressive stroke risk factor management  Therapy recommendations: None  Disposition: Home today  Hypertension  Stable . Permissive hypertension (OK if < 220/120) but gradually normalize in 2-3 days . Long-term BP goal normotensive  Hyperlipidemia  Lipid lowering medication PTA:  none, statin  intolerance with muscle aches and generalized weakness  LDL 207, goal < 70  Current lipid lowering medication: Lipitor 40 (patient agreed to retry statin medication)  Continue statin at discharge  Other Stroke Risk Factors  Advanced age  ETOH use, advised to drink no more than 1 alcoholic beverage per day.  Obesity, Body mass index is 31.19 kg/m., recommend weight loss, diet and exercise as appropriate   Obstructive sleep apnea, on CPAP at home  Other [facility] day # 1  Neurology will sign off. Please call with questions. Pt will follow up with stroke clinic NP at [facility] in about 4 weeks. Thanks for the consult.  Rosalin Hawking, MD PhD Stroke Neurology 11/15/2018 1:38 PM   To contact Stroke Continuity provider, please refer to [email or web address] After hours, contact General Neurology

## 2018-11-15 NOTE — Discharge Instructions (Signed)

## 2018-11-15 NOTE — Evaluation (Signed)
Occupational Therapy Evaluation Patient Details Name: Jason Weaver MRN: [id number] DOB: Oct 17, 1956 Today's Date: 11/15/2018    History of Present Illness Pt is a 63 y/o male admitted secondary to R UE weakness. 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 PMH including but not limited to pulmonary sarcoidosis and HLD.   Clinical Impression   PTA patient independent and working.  Admitted for above, currently presenting at baseline function/modified independence for ADLs, functional transfers and functional mobility in room.  Reports initial discoordination of R UE, but improved significantly, symptoms resolved.  Educated on BE-FAST.  No further OT needs have been identified.  OT signing off.     Follow Up Recommendations  No OT follow up    Equipment Recommendations  None recommended by OT    Recommendations for Other Services       Precautions / Restrictions Precautions Precautions: None Restrictions Weight Bearing Restrictions: No      Mobility Bed Mobility Overal bed mobility: Independent                Transfers Overall transfer level: Independent Equipment used: None                  Balance Overall balance assessment: Independent                               Standardized Balance Assessment Standardized Balance Assessment : Dynamic Gait Index   Dynamic Gait Index Level Surface: Normal Change in Gait Speed: Normal Gait with Horizontal Head Turns: Normal Gait with Vertical Head Turns: Normal Gait and Pivot Turn: Normal Step Over Obstacle: Normal Step Around Obstacles: Normal Steps: Normal Total Score: 24     ADL either performed or assessed with clinical judgement   ADL Overall ADL's : Modified independent                                       General ADL Comments: modified independent for ADLs, simulated  shower transfers      Vision Baseline Vision/History: No visual deficits Patient Visual Report: No change from baseline Vision Assessment?: No apparent visual deficits     Perception     Praxis      Pertinent Vitals/Pain Pain Assessment: No/denies pain     Hand Dominance Right   Extremity/Trunk Assessment Upper Extremity Assessment Upper Extremity Assessment: Overall WFL for tasks assessed   Lower Extremity Assessment Lower Extremity Assessment: Defer to PT evaluation   Cervical / Trunk Assessment Cervical / Trunk Assessment: Normal   Communication Communication Communication: No difficulties   Cognition Arousal/Alertness: Awake/alert Behavior During Therapy: WFL for tasks assessed/performed Overall Cognitive Status: Within Functional Limits for tasks assessed                                     General Comments       Exercises     Shoulder Instructions      Home Living Family/patient expects to be discharged to:: Private residence Living Arrangements: Spouse/significant other Available Help at Discharge: Family Type of Home: House Home Access: Stairs to enter Technical brewer of Steps: 2 Entrance Stairs-Rails: None Home Layout: One level     Bathroom Shower/Tub:  Walk-in shower   Bathroom Toilet: Standard     Home Equipment: None          Prior Functioning/Environment Level of Independence: Independent        Comments: works at a bank, very active usually, enjoys being outside and doing yardwork        OT Problem List:        OT Treatment/Interventions:      OT Goals(Current goals can be found in the care plan section) Acute Rehab OT Goals Patient Stated Goal: "go home today" OT Goal Formulation: With patient  OT Frequency:     Barriers to D/C:            Co-evaluation              AM-PAC OT "6 Clicks" Daily Activity     Outcome Measure Help from another person eating meals?: None Help from  another person taking care of personal grooming?: None Help from another person toileting, which includes using toliet, bedpan, or urinal?: None Help from another person bathing (including washing, rinsing, drying)?: None Help from another person to put on and taking off regular upper body clothing?: None Help from another person to put on and taking off regular lower body clothing?: None 6 Click Score: 24   End of Session Nurse Communication: Mobility status  Activity Tolerance: Patient tolerated treatment well Patient left: Other (comment)(in restroom)  OT Visit Diagnosis: Muscle weakness (generalized) (M62.81)                Time: 6803-2122 OT Time Calculation (min): 8 min Charges:  OT General Charges $OT Visit: 1 Visit OT Evaluation $OT Eval Low Complexity: 1 Low  Delight Stare, OT Acute Rehabilitation Services Pager [phone] Office [phone]   Delight Stare 11/15/2018, 1:37 PM

## 2018-11-15 NOTE — Evaluation (Signed)
Physical Therapy Evaluation & Discharge Patient Details Name: Jason Weaver MRN: [id number] DOB: [birth year] Today's Date: 11/15/2018   History of Present Illness  Pt is a 63 y/o male admitted secondary to R UE weakness. 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 PMH including but not limited to pulmonary sarcoidosis and HLD.  Clinical Impression  Pt presented supine in bed with HOB elevated, awake and willing to participate in therapy session. Prior to admission, pt reported that he was independent with all functional mobility and ADLs. Pt lives with his wife in a single level home with two steps to enter. Pt currently at supervision to independent with all functional mobility including stair negotiation. Pt also participated in a higher level balance assessment and scored a 24/24 on the DGI, indicating that he is a safe community ambulator. No further acute or follow-up PT services indicated at this time. PT signing off.     Follow Up Recommendations No PT follow up    Equipment Recommendations  None recommended by PT    Recommendations for Other Services       Precautions / Restrictions Precautions Precautions: None Restrictions Weight Bearing Restrictions: No      Mobility  Bed Mobility Overal bed mobility: Independent                Transfers Overall transfer level: Independent Equipment used: None                Ambulation/Gait Ambulation/Gait assistance: Supervision Gait Distance (Feet): 200 Feet Assistive device: None Gait Pattern/deviations: Step-through pattern Gait velocity: WFL   General Gait Details: pt with no instability or LOB with ambulation over even surfaces without use of an AD or need for assistance  Stairs Stairs: Yes Stairs assistance: Supervision Stair Management: No rails;Alternating pattern;Forwards Number of Stairs: 2     Wheelchair Mobility    Modified Rankin (Stroke Patients Only) Modified Rankin (Stroke Patients Only) Pre-Morbid Rankin Score: No symptoms Modified Rankin: No symptoms     Balance Overall balance assessment: Independent      Standardized Balance Assessment Standardized Balance Assessment : Dynamic Gait Index   Dynamic Gait Index Level Surface: Normal Change in Gait Speed: Normal Gait with Horizontal Head Turns: Normal Gait with Vertical Head Turns: Normal Gait and Pivot Turn: Normal Step Over Obstacle: Normal Step Around Obstacles: Normal Steps: Normal Total Score: 24       Pertinent Vitals/Pain Pain Assessment: No/denies pain    Home Living Family/patient expects to be discharged to:: Private residence Living Arrangements: Spouse/significant other Available Help at Discharge: Family Type of Home: House Home Access: Stairs to enter Entrance Stairs-Rails: None Technical brewer of Steps: 2 Home Layout: One level Home Equipment: None      Prior Function Level of Independence: Independent    Comments: works at a bank, very active usually, enjoys being outside and doing Runner, broadcasting/film/video Dominance   Dominant Hand: Right    Extremity/Trunk Assessment   Upper Extremity Assessment Upper Extremity Assessment: Defer to OT evaluation    Lower Extremity Assessment Lower Extremity Assessment: Overall WFL for tasks assessed    Cervical / Trunk Assessment Cervical / Trunk Assessment: Normal  Communication   Communication: No difficulties  Cognition Arousal/Alertness: Awake/alert Behavior During Therapy: WFL for tasks assessed/performed Overall Cognitive Status: Within Functional Limits for tasks assessed       General Comments  Exercises     Assessment/Plan    PT Assessment Patent does not need any further PT services  PT Problem List         PT Treatment Interventions      PT Goals (Current goals can be found in the Care Plan  section)  Acute Rehab PT Goals Patient Stated Goal: "go home today" PT Goal Formulation: All assessment and education complete, DC therapy    Frequency     Barriers to discharge        Co-evaluation               AM-PAC PT "6 Clicks" Mobility  Outcome Measure Help needed turning from your back to your side while in a flat bed without using bedrails?: None Help needed moving from lying on your back to sitting on the side of a flat bed without using bedrails?: None Help needed moving to and from a bed to a chair (including a wheelchair)?: None Help needed standing up from a chair using your arms (e.g., wheelchair or bedside chair)?: None Help needed to walk in hospital room?: None Help needed climbing 3-5 steps with a railing? : None 6 Click Score: 24    End of Session   Activity Tolerance: Patient tolerated treatment well Patient left: in bed;with call bell/phone within reach Nurse Communication: Mobility status PT Visit Diagnosis: Other symptoms and signs involving the nervous system (R29.898)    Time: 8882-8003 PT Time Calculation (min) (ACUTE ONLY): 14 min   Charges:   PT Evaluation $PT Eval Moderate Complexity: 1 Mod          Sherie Don, PT, DPT  Acute Rehabilitation Services Pager [phone] Office [place] 11/15/2018, 1:13 PM

## 2018-11-15 NOTE — Discharge Summary (Signed)
Physician Discharge Summary  Jason Weaver Maret NWG:[id number] DOB: 23-Jan-1956  PCP: Vivi Barrack, MD  Admit date: 11/14/2018 Discharge date: 11/15/2018  Recommendations for Outpatient Follow-up:  1. Dr. Dimas Chyle, PCP in 1 week. 2. Please follow HIV antibody results that were sent from the hospital, as outpatient. 3. Stroke Clinic at [facility] in 4 weeks. 4. Dr. Adrian Prows, Cardiology: Office will coordinate with patient to arrange for outpatient TEE and loop recorder.  I discussed with Dr. Einar Gip on day of discharge.  Home Health: None Equipment/Devices: None  Discharge Condition: Improved and stable CODE STATUS: Full Diet recommendation: Heart healthy diet.  Discharge Diagnoses:  Principal Problem:   CVA (cerebrovascular accident) [facility]) Active Problems:   Dyslipidemia with statin intolerance   Insomnia   Brief Summary: 63 year old male, banker, PMH of HLD with history of intolerance to statins, pulmonary sarcoidosis, OSA not on CPAP, obesity initially woke up on 1/19 due to abnormal heaviness to his right arm with concomitant loss of dexterity for which he presented to the ED where CT head was negative and he was discharged home.  Symptoms persisted for which he was seen at his PCPs office 1/20, started on muscle relaxants and MRI head ordered and arranged for next week but symptoms were bothersome, returned to ED on 1/22 and due to not being seen after prolonged weight of about 6 hours he left without being seen.  His PCP then had MRI preformed and performed on 1/24 which confirmed a stroke and he was advised by his PCP to come back to the ED for further evaluation.  He was admitted for evaluation and management of acute ischemic stroke.  Neurology was consulted.  Assessment and plan:  Acute stroke: - Resultant right upper extremity heaviness and transient right side of lips and tongue numbness.  These have resolved. - Neurology was consulted and assisted  with evaluation and management. - Etiology: As per neurology, the left MCA scattered infarcts are in an embolic pattern, source unknown yet but concerning for cardioembolic source. - CT head 11/09/2018: No acute findings. - MRI head 1/24: Small early subacute infarction within the left superior precentral gyrus and additional punctate foci in the left anterior superior frontal cortex. - CTA head and neck: Mild atherosclerosis without flow-limiting stenosis or evident embolic source in the head and neck. - Bilateral lower extremity venous Dopplers: Negative for DVT. - TTE: LVEF 55-60%. - LDL 160, non-HDL 208 on 11/05/2018. - A1c 4.9. - Therapies have evaluated and see no home recommendations. - Stroke MD discussed with patient and recommended inpatient TEE and loop recorder to evaluate for cardioembolic source.  Patient declined inpatient TEE and loop recorder but agreed with outpatient TEE and loop recorder.  I discussed with Dr. Einar Gip, Cardiology who will call patient on Monday of next week to arrange same. - Patient not on antithrombotics prior to admission.  Stroke MD recommends DAPT with aspirin 81 mg daily + Plavix 75 mg daily for 3 weeks followed by aspirin alone. -Outpatient follow-up with Neurology.  Hyperlipidemia - LDL 160, non-HDL 208 on 11/05/2018. Goal <70. - Patient had prior history of statin intolerance with muscle aches and generalized weakness. - Stroke MD discussed with patient who has agreed to retry statin and atorvastatin 40 mg has been initiated.  Close outpatient follow-up.  Obesity/Body mass index is 31.19 kg/m. Recommend diet, exercise and weight loss as outpatient.  Pulmonary sarcoidosis No flare.  OSA Denied using CPAP recently.  Recommend formal sleep study as  outpatient.  Insomnia Continue home Ambien at discharge.    Consultations:  Neurology  Procedures:  None   Discharge Instructions  Discharge Instructions    Activity as tolerated - No  restrictions   Complete by:  As directed    Ambulatory referral to Neurology   Complete by:  As directed    Follow up with stroke clinic NP (Jessica Vanschaick or Cecille Rubin, if both not available, consider Zachery Dauer, or Ahern) at [facility] in about 4 weeks. Thanks.   Call MD for:   Complete by:  As directed    Recurrent strokelike symptoms.   Diet - low sodium heart healthy   Complete by:  As directed        Medication List    TAKE these medications   albuterol 108 (90 Base) MCG/ACT inhaler Commonly known as:  PROAIR HFA INHALE 2 PUFFS INTO THE LUNGS EVERY 6 (SIX) HOURS AS NEEDED FOR WHEEZING.   aspirin 81 MG EC tablet Take 1 tablet (81 mg total) by mouth daily. Start taking on:  November 16, 2018   atorvastatin 40 MG tablet Commonly known as:  LIPITOR Take 1 tablet (40 mg total) by mouth daily at 6 PM.   clopidogrel 75 MG tablet Commonly known as:  PLAVIX Take 1 tablet (75 mg total) by mouth daily for 20 days. Start taking on:  November 16, 2018   diphenhydrAMINE 25 MG tablet Commonly known as:  BENADRYL Take 25 mg by mouth every 6 (six) hours as needed for allergies.   hydrocortisone 2.5 % rectal cream Commonly known as:  ANUSOL-HC Place 1 application rectally 2 (two) times daily as needed for hemorrhoids (itching).   metroNIDAZOLE 500 MG tablet Commonly known as:  FLAGYL Take 1 tablet (500 mg total) by mouth 3 (three) times daily as needed (flareup).   sildenafil 20 MG tablet Commonly known as:  REVATIO Take 1-5 tablets (20-100 mg total) by mouth daily as needed (erectile dysfunction).   valACYclovir 1000 MG tablet Commonly known as:  VALTREX Take 1 g by mouth as directed. Take 1 tablet daily for 3-4 days as needed for flare ups   VITAMIN B 12 PO Take 1 tablet by mouth daily.   VITAMIN B-6 PO Take 1 tablet by mouth daily.   vitamin C 1000 MG tablet Take 1,000 mg by mouth daily.   vitamin E 100 UNIT capsule Take 100 Units by mouth daily.    zolpidem 10 MG tablet Commonly known as:  AMBIEN Take 1 tablet (10 mg total) by mouth at bedtime as needed for up to 30 days for sleep.      Follow-up Information    Vivi Barrack, MD. Schedule an appointment as soon as possible for a visit in 1 week(s).   Specialty:  Family Medicine Contact information: [address] [place] [address] [phone]        Adrian Prows, MD. Schedule an appointment as soon as possible for a visit.   Specialty:  Cardiology Why:  Follow up regarding getting out patient TEE and Loop recorder procedures pertaining to you recent stroke evaluation. MD's office will also call you to arrange. Contact information: [place] [address] [phone]        [facility]. Schedule an appointment as soon as possible for a visit in 4 week(s).   Specialty:  Neurology Contact information: [address] [place] [phone]         Allergies  Allergen Reactions  . Ciprofloxacin Rash  . Statins     Muscle pain and generalized weakness.       Procedures/Studies: Ct Angio Head W Or Wo Contrast  Result Date: 11/15/2018 CLINICAL DATA:  Stroke follow-up EXAM: CT ANGIOGRAPHY HEAD AND NECK TECHNIQUE: Multidetector CT imaging of the head and neck was performed using the standard protocol during bolus administration of intravenous contrast. Multiplanar CT image reconstructions and MIPs were obtained to evaluate the vascular anatomy. Carotid stenosis measurements (when applicable) are obtained utilizing NASCET criteria, using the distal internal carotid diameter as the denominator. CONTRAST:  33mL ISOVUE-370 IOPAMIDOL (ISOVUE-370) INJECTION 76% COMPARISON:  Brain MRI from yesterday FINDINGS: CT HEAD FINDINGS Brain: Small left frontal cortex infarcts by MRI are largely obscured by CT. No hemorrhage or evident progression. Mild chronic small vessel ischemia in the white matter. Vascular:  See below Skull: Negative Sinuses: Generalized mild chronic sinusitis. Orbits: Right cataract resection Review of the MIP images confirms the above findings CTA NECK FINDINGS Aortic arch: Unremarkable where covered.  Three vessel branching. Right carotid system: Low-density atheromatous thickening of the ICA bulb. No stenosis or ulceration. Left carotid system: Mild atheromatous thickening of the ICA bulb. Motion artifact versus low-density plaque at the common carotid origin. No flow limiting stenosis or ulceration. Vertebral arteries: Proximal subclavian arteries are widely patent. Both vertebral arteries are widely patent and non calcified. Skeleton: Cervical spine degeneration.  No acute finding Other neck: Negative. Upper chest: Calcified thoracic adenopathy and subpleural nodularity along the upper left major fissure consistent with history of sarcoid. Review of the MIP images confirms the above findings CTA HEAD FINDINGS Anterior circulation: Atherosclerotic plaque on the carotid siphons without stenosis or ulceration. No branch occlusion or flow limiting stenosis. No identified embolic source or aneurysm. Posterior circulation: Codominant vertebral arteries. The vertebral and basilar arteries are smooth and widely patent. Negative for aneurysm. Venous sinuses: Patent as permitted by contrast timing Anatomic variants: None significant Delayed phase: No abnormal intracranial enhancement Review of the MIP images confirms the above findings IMPRESSION: Mild atherosclerosis without flow limiting stenosis or evident embolic source in the head and neck. Electronically Signed   By: Monte Fantasia M.D.   On: 11/15/2018 10:10   Ct Head Wo Contrast  Result Date: 11/09/2018 CLINICAL DATA:  Initial evaluation for acute right arm numbness and weakness. EXAM: CT HEAD WITHOUT CONTRAST TECHNIQUE: Contiguous axial images were obtained from the base of the skull through the vertex without intravenous contrast. COMPARISON:   Prior MRI from 02/25/2008. FINDINGS: Brain: Cerebral volume within normal limits. Mild chronic small vessel ischemic disease. No acute intracranial hemorrhage. No acute large vessel territory infarct. No mass lesion, midline shift or mass effect. No hydrocephalus. No extra-axial fluid collection. Vascular: No hyperdense vessel. Scattered vascular calcifications noted within the carotid siphons. Skull: Scalp soft tissues and calvarium within normal limits. Sinuses/Orbits: Globes and orbital soft tissues normal. Chronic mucosal thickening throughout the paranasal sinuses. Mastoid air cells are clear. Other: None. IMPRESSION: 1. No acute intracranial abnormality. 2. Mild chronic microvascular ischemic disease. Electronically Signed   By: Jeannine Boga M.D.   On: 11/09/2018 06:59   Ct Angio Neck W Or Wo Contrast  Result Date: 11/15/2018 CLINICAL DATA:  Stroke follow-up EXAM: CT ANGIOGRAPHY HEAD AND NECK TECHNIQUE: Multidetector CT imaging of the head and neck was performed using the standard protocol during bolus administration of intravenous contrast. Multiplanar CT image reconstructions and MIPs were obtained to evaluate the vascular anatomy. Carotid stenosis measurements (when applicable)  are obtained utilizing NASCET criteria, using the distal internal carotid diameter as the denominator. CONTRAST:  102mL ISOVUE-370 IOPAMIDOL (ISOVUE-370) INJECTION 76% COMPARISON:  Brain MRI from yesterday FINDINGS: CT HEAD FINDINGS Brain: Small left frontal cortex infarcts by MRI are largely obscured by CT. No hemorrhage or evident progression. Mild chronic small vessel ischemia in the white matter. Vascular: See below Skull: Negative Sinuses: Generalized mild chronic sinusitis. Orbits: Right cataract resection Review of the MIP images confirms the above findings CTA NECK FINDINGS Aortic arch: Unremarkable where covered.  Three vessel branching. Right carotid system: Low-density atheromatous thickening of the ICA bulb.  No stenosis or ulceration. Left carotid system: Mild atheromatous thickening of the ICA bulb. Motion artifact versus low-density plaque at the common carotid origin. No flow limiting stenosis or ulceration. Vertebral arteries: Proximal subclavian arteries are widely patent. Both vertebral arteries are widely patent and non calcified. Skeleton: Cervical spine degeneration.  No acute finding Other neck: Negative. Upper chest: Calcified thoracic adenopathy and subpleural nodularity along the upper left major fissure consistent with history of sarcoid. Review of the MIP images confirms the above findings CTA HEAD FINDINGS Anterior circulation: Atherosclerotic plaque on the carotid siphons without stenosis or ulceration. No branch occlusion or flow limiting stenosis. No identified embolic source or aneurysm. Posterior circulation: Codominant vertebral arteries. The vertebral and basilar arteries are smooth and widely patent. Negative for aneurysm. Venous sinuses: Patent as permitted by contrast timing Anatomic variants: None significant Delayed phase: No abnormal intracranial enhancement Review of the MIP images confirms the above findings IMPRESSION: Mild atherosclerosis without flow limiting stenosis or evident embolic source in the head and neck. Electronically Signed   By: Monte Fantasia M.D.   On: 11/15/2018 10:10   [name] AY Contrast  Result Date: 11/14/2018 CLINICAL DATA:  63 y/o M; Neuro deficit(s), subacute proprioception issue on right UE. Stroke symptoms since 11/09/2018. EXAM: MRI HEAD WITHOUT AND WITH CONTRAST TECHNIQUE: Multiplanar, multiecho pulse sequences of the brain and surrounding structures were obtained without and with intravenous contrast. CONTRAST:  58mL MULTIHANCE GADOBENATE DIMEGLUMINE 529 MG/ML IV SOLN COMPARISON:  11/09/2018 CT head.  02/25/2008 MRI of the head. FINDINGS: Brain: Group of small foci of mildly reduced diffusion, stippled enhancement, and T2 FLAIR hyperintense signal  spanning 7 x 16 mm (AP by ML series 6, image 83) within the left superior precentral gyrus compatible with early subacute infarction. Additional punctate focus of subacute infarction is present within the left anterosuperior frontal cortex (series 6, image 78). No associated hemorrhage or mass effect. Scattered punctate nonspecific T2 FLAIR hyperintensities in subcortical and periventricular white matter are compatible with mild chronic microvascular ischemic changes. Mild volume loss of the brain. Mild progression of volume loss and microvascular ischemic changes from 2009. No extra-axial collection, hydrocephalus, mass effect, or herniation. Vascular: Normal flow voids. Skull and upper cervical spine: Normal marrow signal. Sinuses/Orbits: Mild diffuse paranasal sinus mucosal thickening. No abnormal signal of mastoid air cells. Right intra-ocular lens replacement. Other: None. IMPRESSION: 1. Small early subacute infarction within the left superior precentral gyrus and additional punctate focus in the left anterosuperior frontal cortex. No associated hemorrhage or mass effect. 2. Mild chronic microvascular ischemic changes and volume loss of the brain. Mild progression from 2009. These results will be called to the ordering clinician or representative by the Radiologist Assistant, and communication documented in the PACS or zVision Dashboard. Electronically Signed   By: Kristine Garbe M.D.   On: 11/14/2018 14:49   Vas [place] Lower Extremity Venous (  dvt)  Result Date: 11/15/2018  Lower Venous Study Indications: Stroke.  Performing Technologist: Maudry Mayhew MHA, RDMS, RVT, RDCS  Examination Guidelines: A complete evaluation includes B-mode imaging, spectral Doppler, color Doppler, and power Doppler as needed of all accessible portions of each vessel. Bilateral testing is considered an integral part of a complete examination. Limited examinations for reoccurring indications may be performed as noted.   Right Venous Findings: +---------+---------------+---------+-----------+----------+-------+          CompressibilityPhasicitySpontaneityPropertiesSummary +---------+---------------+---------+-----------+----------+-------+ CFV      Full           Yes      Yes                          +---------+---------------+---------+-----------+----------+-------+ SFJ      Full                                                 +---------+---------------+---------+-----------+----------+-------+ FV Prox  Full                                                 +---------+---------------+---------+-----------+----------+-------+ FV Mid   Full                                                 +---------+---------------+---------+-----------+----------+-------+ FV DistalFull                                                 +---------+---------------+---------+-----------+----------+-------+ PFV      Full                                                 +---------+---------------+---------+-----------+----------+-------+ POP      Full           Yes      Yes                          +---------+---------------+---------+-----------+----------+-------+ PTV      Full                                                 +---------+---------------+---------+-----------+----------+-------+ PERO     Full                                                 +---------+---------------+---------+-----------+----------+-------+  Left Venous Findings: +---------+---------------+---------+-----------+----------+-------+          CompressibilityPhasicitySpontaneityPropertiesSummary +---------+---------------+---------+-----------+----------+-------+ CFV      Full           Yes  Yes                          +---------+---------------+---------+-----------+----------+-------+ SFJ      Full                                                  +---------+---------------+---------+-----------+----------+-------+ FV Prox  Full                                                 +---------+---------------+---------+-----------+----------+-------+ FV Mid   Full                                                 +---------+---------------+---------+-----------+----------+-------+ FV DistalFull                                                 +---------+---------------+---------+-----------+----------+-------+ PFV      Full                                                 +---------+---------------+---------+-----------+----------+-------+ POP      Full           Yes      Yes                          +---------+---------------+---------+-----------+----------+-------+ PTV      Full                                                 +---------+---------------+---------+-----------+----------+-------+ PERO     Full                                                 +---------+---------------+---------+-----------+----------+-------+    Summary: Right: There is no evidence of deep vein thrombosis in the lower extremity. No cystic structure found in the popliteal fossa. Left: There is no evidence of deep vein thrombosis in the lower extremity. No cystic structure found in the popliteal fossa.  *See table(s) above for measurements and observations. Electronically signed by Servando Snare MD on 11/15/2018 at 1:29:37 PM.    Final       Subjective: Patient denies recurrence of symptoms.  No recurrence of right upper extremity heaviness or weakness.  No recurrence of right-sided lips or tongue numbness.  Insists on going home today.  Denies any other complaints.  Right-handed.  Discharge Exam:  Vitals:   11/15/18 0200 11/15/18 0414 11/15/18 0755 11/15/18 1139  BP: 120/84 114/76 119/82 125/82  Pulse: (!) 52 (!) 53 Marland Kitchen)  58 63  Resp:   18 16  Temp:  97.7 F (36.5 C) 98 F (36.7 C) 98 F (36.7 C)  TempSrc:  Oral Oral Oral   SpO2:  97% 96% 96%  Weight:      Height:        General: Pleasant middle-age male, moderately built and nourished, sitting up comfortably in bed. Cardiovascular: S1 & S2 heard, RRR, S1/S2 +. No murmurs, rubs, gallops or clicks. No JVD or pedal edema.  Telemetry personally reviewed: Sinus rhythm. Respiratory: Clear to auscultation without wheezing, rhonchi or crackles. No increased work of breathing. Abdominal:  Non distended, non tender & soft. No organomegaly or masses appreciated. Normal bowel sounds heard. CNS: Alert and oriented. No focal deficits. Extremities: Symmetric 5 x 5 power.  No pronator drift. Extremities: no edema, no cyanosis    The results of significant diagnostics from this hospitalization (including imaging, microbiology, ancillary and laboratory) are listed below for reference.       Labs: CBC: Recent Labs  Lab 11/09/18 0602  WBC 5.4  NEUTROABS 3.6  HGB 16.5  HCT 48.0  MCV 90.7  PLT 662   Basic Metabolic Panel: Recent Labs  Lab 11/09/18 0602 11/15/18 1029  NA 137  --   K 4.1  --   CL 107  --   CO2 22  --   GLUCOSE 92  --   BUN 16  --   CREATININE 0.88 0.96  CALCIUM 9.7  --    Liver Function Tests: Recent Labs  Lab 11/09/18 0602  AST 23  ALT 33  ALKPHOS 61  BILITOT 1.0  PROT 7.4  ALBUMIN 4.5   Cardiac Enzymes: Recent Labs  Lab 11/14/18 1657  TROPONINI <0.03   Hgb A1c Recent Labs    11/15/18 1029  HGBA1C 4.9      Time coordinating discharge: 40 minutes  SIGNED:  Vernell Leep, MD, FACP, [facility]. [facility]  To contact the attending provider between 7A-7P or the covering provider during after hours 7P-7A, please log into the web site [email or web address] and access using universal [place] password for that web site. If you do not have the password, please call the hospital operator.

## 2018-11-15 NOTE — Progress Notes (Signed)
Bilateral lower extremity venous duplex completed. Refer to "CV Proc" under chart review to view preliminary results.  11/15/2018 10:01 AM Maudry Mayhew, MHA, RVT, RDCS, RDMS

## 2018-11-15 NOTE — Progress Notes (Signed)
Patient discharged home with spouse. Will follow up 11/28/2018 with the neurology office to schedule outpatient TEE.  Discharge instructions given. IV and telemetry removed. Prescriptions electronic to pharmacy. Patient transported from unit via wheelchair with staff. Jason Weaver

## 2018-11-15 NOTE — Consult Note (Signed)
Requesting Physician: Dr. Bonner Puna    Chief Complaint: Right arm weakness  History obtained from: Patient and Chart     HPI:                                                                                                                                       Jason Weaver is an 63 y.o. male past medical history of hyperlipidemia, pulmonary sarcoidosis, OSA on CPAP, obesity who presents to [facility] long emergency room after an outpatient MRI showed a left cortical subacute infarct.  Last known normal was on 11/08/2018 when he went to bed.  When he woke up he noticed that his right arm appeared heavier than normal and there was loss of dexterity.  He presented to the emergency department and head CT was negative and patient was discharged.  During the.  He will have waxing and waning of symptoms and upon getting his MRI brain showed a left superior precentral gyrus infarct as well as another smaller infarct in the superior frontal cortex.  Patient was admitted to the hospital service neurology was consulted for further evaluation.  Date last known well: 1.18.20 tPA Given: no, outside window NIHSS: 2 Baseline MRS 0    Past Medical History:  Diagnosis Date  . Diverticulosis of colon   . ED (erectile dysfunction)   . Gout   . Hearing loss   . Hx of colonic polyps   . Hyperlipidemia   . Nasal septal deviation   . Obesity   . Pulmonary sarcoidosis ([place])  1997    biopsy proven    Past Surgical History:  Procedure Laterality Date  . CATARACT EXTRACTION    . HERNIA REPAIR  2009  . LUNG BIOPSY  1997  . TONSILLECTOMY  1979    Family History  Problem Relation Age of Onset  . COPD Father        was a smoker  . Arthritis Father   . Asthma Sister   . Cancer Sister    Social History:  reports that he has never smoked. He has never used smokeless tobacco. He reports current alcohol use of about 2.0 standard drinks of alcohol per week. He reports that he does not use drugs.  Allergies:   Allergies  Allergen Reactions  . Ciprofloxacin Rash    Medications:  I reviewed home medications   ROS:                                                                                                                                     14 systems reviewed and negative except above   Examination:                                                                                                      General: Appears well-developed  Psych: Affect appropriate to situation Eyes: No scleral injection HENT: No OP obstrucion Head: Normocephalic.  Cardiovascular: Normal rate and regular rhythm.  Respiratory: Effort normal and breath sounds normal to anterior ascultation GI: Soft.  No distension. There is no tenderness.  Skin: WDI    Neurological Examinatio Mental Status: Alert, oriented, thought content appropriate.  Speech fluent without evidence of aphasia. Able to follow 3 step commands without difficulty. Cranial Nerves: II: Visual fields grossly normal,  III,IV, VI: ptosis not present, extra-ocular motions intact bilaterally, pupils equal, round, reactive to light and accommodation V,VII: smile symmetric, facial light touch sensation normal bilaterally VIII: hearing normal bilaterally IX,X: uvula rises symmetrically XI: bilateral shoulder shrug XII: midline tongue extension Motor: Right : Upper extremity   5/5    Left:     Upper extremity   5/5  Lower extremity   5/5     Lower extremity   5/5 Tone and bulk:normal tone throughout; no atrophy noted Sensory: Pinprick and light touch intact throughout, bilaterally Deep Tendon Reflexes: 2+ and symmetric throughout Plantars: Right: downgoing   Left: downgoing Cerebellar: normal finger-to-nose, normal rapid alternating movements and normal heel-to-shin test Gait: normal gait and station     Lab Results: Basic  Metabolic Panel: Recent Labs  Lab 11/09/18 0602  NA 137  K 4.1  CL 107  CO2 22  GLUCOSE 92  BUN 16  CREATININE 0.88  CALCIUM 9.7    CBC: Recent Labs  Lab 11/09/18 0602  WBC 5.4  NEUTROABS 3.6  HGB 16.5  HCT 48.0  MCV 90.7  PLT 229    Coagulation Studies: Recent Labs    11/14/18 1657  LABPROT 12.5  INR 0.94    Imaging: [name] DX Contrast  Result Date: 11/14/2018 CLINICAL DATA:  63 y/o M; Neuro deficit(s), subacute proprioception issue on right UE. Stroke symptoms since 11/09/2018. EXAM: MRI HEAD WITHOUT AND WITH CONTRAST TECHNIQUE: Multiplanar, multiecho pulse sequences of the brain and surrounding structures were obtained without and with intravenous contrast.  CONTRAST:  33mL MULTIHANCE GADOBENATE DIMEGLUMINE 529 MG/ML IV SOLN COMPARISON:  11/09/2018 CT head.  02/25/2008 MRI of the head. FINDINGS: Brain: Group of small foci of mildly reduced diffusion, stippled enhancement, and T2 FLAIR hyperintense signal spanning 7 x 16 mm (AP by ML series 6, image 83) within the left superior precentral gyrus compatible with early subacute infarction. Additional punctate focus of subacute infarction is present within the left anterosuperior frontal cortex (series 6, image 78). No associated hemorrhage or mass effect. Scattered punctate nonspecific T2 FLAIR hyperintensities in subcortical and periventricular white matter are compatible with mild chronic microvascular ischemic changes. Mild volume loss of the brain. Mild progression of volume loss and microvascular ischemic changes from 2009. No extra-axial collection, hydrocephalus, mass effect, or herniation. Vascular: Normal flow voids. Skull and upper cervical spine: Normal marrow signal. Sinuses/Orbits: Mild diffuse paranasal sinus mucosal thickening. No abnormal signal of mastoid air cells. Right intra-ocular lens replacement. Other: None. IMPRESSION: 1. Small early subacute infarction within the left superior precentral gyrus and  additional punctate focus in the left anterosuperior frontal cortex. No associated hemorrhage or mass effect. 2. Mild chronic microvascular ischemic changes and volume loss of the brain. Mild progression from 2009. These results will be called to the ordering clinician or representative by the Radiologist Assistant, and communication documented in the PACS or zVision Dashboard. Electronically Signed   By: Kristine Garbe M.D.   On: 11/14/2018 14:49     ASSESSMENT AND PLAN   63 y.o. male past medical history of hyperlipidemia, pulmonary sarcoidosis, OSA on CPAP, obesity who presents to [facility] long emergency room after an outpatient MRI showed a left cortical subacute infarct. I suspect patient may have carotid stenosis and will order stat CTA head and neck. Will hold of Plavix load, in case patient has critical stenosis and urgent CEA.   Subacute left MCA infarcts  Risk factors; HLD, obesity Etiology: athero vs cardioembolic   Recommend # CTA Head and neck  #Transthoracic Echo  #  Recommend dual antiplatelet therapy for 3 weeks and then ASA alone  ( ASA 81 and plavix 75mg  daily after 300mg  loading dose of Plavix) ---WILL ORDER AFTER CT ANGIOGRAM COMPLETED  f#Start or continue Atorvastatin 80 mg/other high intensity statin # BP goal: permissive HTN upto 220/120 mmHg ( 185/110 if patient has CHF, CKD) # HBAIC and Lipid profile # Telemetry monitoring # Frequent neuro checks # NPO until passes stroke swallow screen  Please page stroke NP  Or  PA  Or MD from 8am -4 pm  as this patient from this time will be  followed by the stroke.   You can look them up on [email or web address]  Password [facility]   Sushanth Aroor [facility] Pager Number [phone]

## 2018-11-16 ENCOUNTER — Other Ambulatory Visit: Payer: BLUE CROSS/BLUE SHIELD

## 2018-11-16 LAB — HIV ANTIBODY (ROUTINE TESTING W REFLEX): HIV Screen 4th Generation wRfx: NONREACTIVE

## 2018-11-17 ENCOUNTER — Telehealth: Payer: Self-pay

## 2018-11-17 SURGERY — ECHOCARDIOGRAM, TRANSESOPHAGEAL
Anesthesia: Monitor Anesthesia Care

## 2018-11-17 NOTE — Telephone Encounter (Signed)
LM for patient to return call for TCM call. 

## 2018-11-18 DIAGNOSIS — I63412 Cerebral infarction due to embolism of left middle cerebral artery: Secondary | ICD-10-CM | POA: Diagnosis not present

## 2018-11-18 DIAGNOSIS — E782 Mixed hyperlipidemia: Secondary | ICD-10-CM | POA: Diagnosis not present

## 2018-11-18 DIAGNOSIS — I672 Cerebral atherosclerosis: Secondary | ICD-10-CM | POA: Diagnosis not present

## 2018-11-18 DIAGNOSIS — G4733 Obstructive sleep apnea (adult) (pediatric): Secondary | ICD-10-CM | POA: Diagnosis not present

## 2018-11-18 NOTE — Progress Notes (Signed)
Please inform patient of the following:  His HIV test from the hospital is NEGATIVE.  Jason Weaver. Jerline Pain, MD 11/18/2018 8:11 AM

## 2018-11-24 NOTE — H&P (Signed)
History of Present Illness Jason Page MD; 11/18/2018 8:21 PM) Patient words: np eval hosp fu  **pt had a stoke**.  The patient is a 63 year old male who presents with a cerebrovascular accident. Patient with hyperlipidemia, and pulmonary sarcoidosis, obstructive sleep apnea not on CPAP, obesity, presents for evaluation after recent CVA.  [name] is a Caucasian male who woke up with symptoms on 11/09/18 with heaviness in his right arm and loss of dexterity. CT scan of the head in the emergency room was negative. He was discharged home and was seen by his PCP on 1/20 and was started on muscle relaxers. He again presented to the emergency room on 01/22 for continued symptoms; however, due to long wait, left without being seen. His PCP ordered outpatient MRI which confirmed left MCA scattered infarcts in a embolic pattern. Had negative lower extremity duplex, normal echocardiogram, source for embolic CVA not determined. Patient refused inpatient TEE. He now presents to discuss outpatient TEE and loop recorder implantation for evaluation of cardioembolic source. He was discharged on 11/15/2018 with dual antiplatelet therapy and Lipitor. He has not previously tolerated statins in the past, but at discharge was agreeable to restarting Lipitor as his LDL was 160. Currently tolerating well.  Patient has had resolution of right arm dexterity since being home from the hospital. Continues to have mild gum and upper mouth tingling sensation. He was diagnosed with sleep apnea over 10 years ago. He denies any daytime fatigue, does snore. He does not use CPAP.  He does exercise at the gym several days a week and also walks his dogs at home. No former tobacco use. Maternal grandfather had CVA in his 8's. No other family history of heart disease.   Problem List/Past Medical Jason Weaver; 11/18/2018 1:45 PM) Stroke (I63.9)   Allergies Jason Weaver; 11/18/2018 1:35 PM) Cipro  *Fluoroquinolones**  Rash.  Family History Jason Weaver; 11/18/2018 1:37 PM) Mother  In good health. [facility] Father  Deceased. at 57; [facility] Sister 1  younger; [facility] Brother 1  younger; [facility]  Social History Jason Weaver; 11/18/2018 1:38 PM) Current tobacco use  Never smoker. Alcohol Use  Moderate alcohol use, Drinks wine. 2 glasses a day Marital status  Married. Living Situation  Lives with spouse. Number of Children  0.  Past Surgical History Jason Weaver; 11/18/2018 1:38 PM) None [11/18/2018]:  Medication History [name], FNP-C; 11/18/2018 2:37 PM) Clopidogrel Bisulfate (75MG Tablet, 1 Oral daily for 20 days) Active. Aspirin Low Dose (81MG Tablet DR, 1 Oral daily) Active. Atorvastatin Calcium (40MG Tablet, 1 Oral daily at 6pm) Active. Valtrex (500MG Tablet, 1 Oral two times daily) Active. Medications Reconciled (medication present)  Diagnostic Studies History Jason Page, MD; 11/18/2018 8:16 PM) Colonoscopy [2016]: Normal. Echocardiogram  Echocardiogram 11/15/2018: Normal LV size, normal LV systolic function, EF 66-44%. No other significant abnormality noted.    Review of Systems Jason Page MD; 11/18/2018 8:21 PM) General Not Present- Appetite Loss and Weight Gain. Respiratory Not Present- Chronic Cough and Wakes up from Sleep Wheezing or Short of Breath. Gastrointestinal Not Present- Black, Tarry Stool and Difficulty Swallowing. Musculoskeletal Not Present- Decreased Range of Motion and Muscle Atrophy. Neurological Not Present- Attention Deficit. Psychiatric Not Present- Personality Changes and Suicidal Ideation. Endocrine Not Present- Cold Intolerance and Heat Intolerance. Hematology Not Present- Abnormal Bleeding. All other systems negative  Vitals Jason Weaver; 11/18/2018 1:48 PM) 11/18/2018 1:35 PM Weight: 232.19 lb Height: 72in Body Surface Area: 2.27 m Body Mass Index: 31.49  kg/m  Pulse: 74 (Regular)  P.OX:  98% (Room air) BP: 117/79 (Sitting, Right Arm, Standard)       Physical Exam Jason Page MD; 11/18/2018 8:21 PM) General Mental Status-Alert. General Appearance-Cooperative and Appears stated age. Build & Nutrition-Well built and Mildly obese.  Head and Neck Thyroid Gland Characteristics - normal size and consistency and no palpable nodules.  Chest and Lung Exam Chest and lung exam reveals -quiet, even and easy respiratory effort with no use of accessory muscles, non-tender and on auscultation, normal breath sounds, no adventitious sounds.  Cardiovascular Cardiovascular examination reveals -normal heart sounds, regular rate and rhythm with no murmurs, carotid auscultation reveals no bruits, abdominal aorta auscultation reveals no bruits and no prominent pulsation, femoral artery auscultation bilaterally reveals normal pulses, no bruits, no thrills, normal pedal pulses bilaterally and no digital clubbing, cyanosis, edema, increased warmth or tenderness.  Abdomen Palpation/Percussion Palpation and Percussion of the abdomen reveal - Non Tender and No hepatosplenomegaly.  Neurologic Neurologic evaluation reveals -alert and oriented x 3 with no impairment of recent or remote memory. Motor-Grossly intact without any focal deficits.  Musculoskeletal Global Assessment Left Lower Extremity - no deformities, masses or tenderness, no known fractures. Right Lower Extremity - no deformities, masses or tenderness, no known fractures.   Assessment & Plan Jason Page MD; 11/18/2018 8:25 PM) Cerebral infarction due to embolism of left middle cerebral artery (O67.124) Story: MRI brain 11/14/2018: 1. Small early subacute infarction within the left superior precentral gyrus and additional punctate focus in the left anterosuperior frontal cortex. No associated hemorrhage or mass effect. 2. Mild chronic microvascular ischemic changes and volume loss of the brain. Mild  progression from 2009.  CTA head and neck 11/15/2018: Mild atherosclerosis without flow limiting stenosis or evident embolic source in the head and neck.  Echocardiogram 11/15/2018: Normal LV size, normal LV systolic function, EF 58-09%. No other significant abnormality noted.  EKG 11/18/2018: Normal sinus rhythm at rate of 71 bpm, borderline criteria for left atrial enlargement, otherwise normal EKG. Current Plans Complete electrocardiogram (93000) CAS (cerebral atherosclerosis) (I67.2) Obstructive sleep apnea (G47.33) Story: not on CPAP Hyperlipidemia, mixed (E78.2) Laboratory examination (X83.38) Story: 11/05/2018: Cholesterol 253, triglycerides 239, HDL 45, non-HDL 207. Direct LDL 160. Potassium 4.4, creatinine 1.09, EGFR 73, CMP normal. CBC normal. . Recommendation:  Mr Selso Mannor is a Caucasian male who woke up with symptoms on 11/09/18 with heaviness in his right arm and loss of dexterity. On MRI on 11/12/2018, he was found to have left MCA scattered embolic pattern infarct. Her to be cryptogenic stroke, was discharged home and recommended to follow-up with me. D/C from hospital on 11/15/18. PMH: OSA diagnosed 10 years ago and mixed hyperlipidemia. No hypertension or DM or F/H of vascular disease.  Is presently doing well and symptoms of stroke has essentially resolved. Still has mild mouth tingling sensation.  He is noted to have mild atherosclerosis noted on CTA of neck; however, no source for emboli noted. Schedule for TEE to better evaluate the structural abnormality. I have explained risks, benefits,alternatives to TEE, including but not limited to rare esophageal perforation, bleeding, aspiration pneumonia.  As discussed by his neurologist Dr. Rosalin Hawking, advised him that if TEE is unyielding, he will need a loop recorder implantation to evaluate for paroxysmal atrial fibrillation. He has risk factors for atrial fibrillation that includes prior OSA which he denies symptoms  suggestive of this now, mild obesity is another risk factor for atrial fibrillation.  He has had hyperlipidemia  and previously had not been able to tolerate statins, he is presently on moderate dose Lipitor. Advised him that he should not discontinue this and if he starts noticing any side effects to immediately contact us so we could reduce the dose and potentially add Zetia to the present medical regimen. I advised him to continue dual antiplatelet therapy for now. All questions regarding TEE and also loop recorder implantation discussed in detail.   CC: Dimas Chyle, MD (PCP); Rosalin Hawking, MD (Neuro)    Signed by Jason Page, MD (11/18/2018 8:26 PM)

## 2018-11-25 ENCOUNTER — Ambulatory Visit (HOSPITAL_COMMUNITY): Payer: BLUE CROSS/BLUE SHIELD

## 2018-11-25 ENCOUNTER — Ambulatory Visit (HOSPITAL_COMMUNITY)
Admission: RE | Admit: 2018-11-25 | Discharge: 2018-11-25 | Disposition: A | Discharge status: Home / Self Care | Payer: BLUE CROSS/BLUE SHIELD | Attending: Cardiology | Admitting: Cardiology

## 2018-11-25 ENCOUNTER — Other Ambulatory Visit: Payer: Self-pay

## 2018-11-25 ENCOUNTER — Encounter (HOSPITAL_COMMUNITY): Payer: Self-pay | Admitting: Cardiology

## 2018-11-25 ENCOUNTER — Encounter (HOSPITAL_COMMUNITY)
Admission: RE | Disposition: A | Discharge status: Home / Self Care | Payer: Self-pay | Source: Home / Self Care | Attending: Cardiology

## 2018-11-25 ENCOUNTER — Encounter: Payer: Self-pay | Admitting: Neurology

## 2018-11-25 DIAGNOSIS — E785 Hyperlipidemia, unspecified: Secondary | ICD-10-CM | POA: Insufficient documentation

## 2018-11-25 DIAGNOSIS — I639 Cerebral infarction, unspecified: Secondary | ICD-10-CM | POA: Insufficient documentation

## 2018-11-25 DIAGNOSIS — I672 Cerebral atherosclerosis: Secondary | ICD-10-CM | POA: Diagnosis not present

## 2018-11-25 HISTORY — PX: TEE WITHOUT CARDIOVERSION: SHX5443

## 2018-11-25 SURGERY — ECHOCARDIOGRAM, TRANSESOPHAGEAL
Anesthesia: Moderate Sedation

## 2018-11-25 MED ORDER — FENTANYL CITRATE (PF) 100 MCG/2ML IJ SOLN
INTRAMUSCULAR | Status: AC
  Filled 2018-11-25: qty 2

## 2018-11-25 MED ORDER — FENTANYL CITRATE (PF) 100 MCG/2ML IJ SOLN
INTRAMUSCULAR | Status: DC | PRN
  Administered 2018-11-25: 50 ug via INTRAVENOUS
  Administered 2018-11-25 (×2): 25 ug via INTRAVENOUS

## 2018-11-25 MED ORDER — LACTATED RINGERS IV SOLN
INTRAVENOUS | Status: DC
  Administered 2018-11-25: 08:00:00 via INTRAVENOUS

## 2018-11-25 MED ORDER — MIDAZOLAM HCL (PF) 5 MG/ML IJ SOLN
INTRAMUSCULAR | Status: AC
  Filled 2018-11-25: qty 2

## 2018-11-25 MED ORDER — BUTAMBEN-TETRACAINE-BENZOCAINE 2-2-14 % EX AERO
INHALATION_SPRAY | CUTANEOUS | Status: DC | PRN
  Administered 2018-11-25: 2 via TOPICAL

## 2018-11-25 MED ORDER — SODIUM CHLORIDE 0.9 % IV SOLN: INTRAVENOUS | Status: DC

## 2018-11-25 MED ORDER — MIDAZOLAM HCL (PF) 10 MG/2ML IJ SOLN
INTRAMUSCULAR | Status: DC | PRN
  Administered 2018-11-25 (×3): 1 mg via INTRAVENOUS

## 2018-11-25 NOTE — Discharge Instructions (Signed)

## 2018-11-25 NOTE — Progress Notes (Signed)
  Echocardiogram Echocardiogram Transesophageal has been performed.  Jason Weaver 11/25/2018, 10:08 AM

## 2018-11-25 NOTE — CV Procedure (Signed)
TEE: Under moderate sedation, TEE was performed without complications: LV: Normal. Normal EF. RV: Normal LA: Normal. Left atrial appendage: Normal without thrombus. Normal function. Inter atrial septum is intact without defect. Double contrast study negative for atrial level shunting. No late appearance of bubbles either. RA: Normal  MV: Normal Mild MR. TV: Normal No TR AV: Normal. No AI or AS. PV: Normal. No PI.  Thoracic and ascending aorta: Normal without significant plaque or atheromatous changes.  Conscious sedation protocol was followed, I personally administered conscious sedation and monitored the patient. Patient received 3 milligrams of Versed and 100 . Patient tolerated the procedure well and there was no complication from conscious sedation. Time administered was 20 min and procedure ended at 9:45 AM.  Nigel Mormon, MD [facility] Pager: [phone] Office: [phone] If no answer Cell [phone]

## 2018-11-25 NOTE — Interval H&P Note (Signed)
History and Physical Interval Note:  11/25/2018 9:17 AM  Jason Weaver  has presented today for surgery, with the diagnosis of CDA  The various methods of treatment have been discussed with the patient and family. After consideration of risks, benefits and other options for treatment, the patient has consented to  Procedure(s): TRANSESOPHAGEAL ECHOCARDIOGRAM (TEE) (N/A) as a surgical intervention .  The patient's history has been reviewed, patient examined, no change in status, stable for surgery.  I have reviewed the patient's chart and labs.  Questions were answered to the patient's satisfaction.     [place]

## 2018-11-27 NOTE — Progress Notes (Signed)
[facility] Clinic Note - Initial Visit   Date: 11/28/18  Jason Weaver MRN: [id number] DOB: Feb 27, 1956   Dear Dr. Amil Amen:  Thank you for your kind referral of Jason Weaver for consultation of bilateral foot numbness. Although his history is well known to you, please allow [place] to reiterate it for the purpose of our medical record. The patient was accompanied to the clinic by self.    History of Present Illness: Jason Weaver is a 63 y.o. right-handed Caucasian male with gout, pulmonary sarcoidosis, OSA, history of right vestibular dysfunction, and hyperlipidemia presenting for referred for evaluation bilateral feet numbness and new complaints of Jason Weaver.  Starting around the summer 2019, he began experiencing numbness over the soles of the toes, which is worse at nighttime. Symptoms are constant.  He has some imbalance due to history of vestibular disease.  He denies weakness of the feet.  He has no history of diabetes, or family history of neuropathy.  He drinks 2 glasses of wine nightly for many years.   She also suffered Jason Weaver diagnosed by MRI on 11/14/2018 after developing 5-day history of right arm weakness, lack of coordination, and right facial numbness.  He had normal intra and extracranial vessels, normal surface echocardiogram and negative [place] legs.  TEE did not show source of thrombus or PFO.  He will be seeing Dr. Einar Gip next week to discuss implantable loop recorder.  He has no diabetes and does not smoke.  He was not taking antiplatelet therapy prior to diagnosis of Weaver.  He is compliant with aspirin 81mg , plavix 75mg  daily, and atorvastatin 40mg  daily.  Out-side paper records, electronic medical record, and images have been reviewed where available and summarized as:  Lab Results  Component Value Date   CHOL 253 (H) 11/05/2018   HDL 45.00 11/05/2018   LDLDIRECT 160.0 11/05/2018   TRIG 239.0 (H) 11/05/2018     CHOLHDL 6 11/05/2018   Lab Results  Component Value Date   HGBA1C 4.9 11/15/2018   Lab Results  Component Value Date   VITAMINB12 453 11/05/2018   MRI brain 11/14/2018: 1. Small early subacute infarction within the Jason superior precentral gyrus and additional punctate focus in the Jason anterosuperior frontal cortex. No associated hemorrhage or mass effect. 2. Mild chronic microvascular ischemic changes and volume loss of the brain. Mild progression from 2009.  CTA head and neck 11/15/2018: Mild atherosclerosis without flow limiting stenosis or evident embolic source in the head and neck.  Echocardiogram 11/15/2018: Normal LV size, normal LV systolic function, EF 33-82%. No other significant abnormality noted.  EKG 11/18/2018: Normal sinus rhythm at rate of 71 bpm, borderline criteria for Jason atrial enlargement, otherwise normal EKG.  Past Medical History:  Diagnosis Date  . Diverticulosis of colon   . ED (erectile dysfunction)   . Gout   . Hearing loss   . Hx of colonic polyps   . Hyperlipidemia   . Nasal septal deviation   . Obesity   . Pulmonary sarcoidosis ([place])  1997    biopsy proven    Past Surgical History:  Procedure Laterality Date  . CATARACT EXTRACTION    . HERNIA REPAIR  2009  . LUNG BIOPSY  1997  . TEE WITHOUT CARDIOVERSION N/A 11/25/2018   Procedure: TRANSESOPHAGEAL ECHOCARDIOGRAM (TEE);  Surgeon: Nigel Mormon, MD;  Location: [facility] ENDOSCOPY;  Service: Cardiovascular;  Laterality: N/A;  . TONSILLECTOMY  1979     Medications:  Outpatient Encounter Medications as of 11/28/2018  Medication Sig  . albuterol (PROAIR HFA) 108 (90 Base) MCG/ACT inhaler INHALE 2 PUFFS INTO THE LUNGS EVERY 6 (SIX) HOURS AS NEEDED FOR WHEEZING.  . Ascorbic Acid (VITAMIN C) 1000 MG tablet Take 1,000 mg by mouth daily.  Marland Kitchen aspirin EC 81 MG EC tablet Take 1 tablet (81 mg total) by mouth daily.  Marland Kitchen atorvastatin (LIPITOR) 40 MG tablet Take 1 tablet (40 mg total) by mouth daily  at 6 PM.  . clopidogrel (PLAVIX) 75 MG tablet Take 1 tablet (75 mg total) by mouth daily for 20 days.  . Cyanocobalamin (VITAMIN B 12 PO) Take 1 tablet by mouth daily.  . diphenhydrAMINE (BENADRYL) 25 MG tablet Take 25 mg by mouth every 6 (six) hours as needed for allergies.  . hydrocortisone (ANUSOL-HC) 2.5 % rectal cream Place 1 application rectally 2 (two) times daily as needed for hemorrhoids (itching).  . metroNIDAZOLE (FLAGYL) 500 MG tablet Take 1 tablet (500 mg total) by mouth 3 (three) times daily as needed (flareup).  . Pyridoxine HCl (VITAMIN B-6 PO) Take 1 tablet by mouth daily.  . valACYclovir (VALTREX) 1000 MG tablet Take 1 g by mouth as directed. Take 1 tablet daily for 3-4 days as needed for flare ups  . vitamin E 100 UNIT capsule Take 100 Units by mouth daily.   No facility-administered encounter medications on file as of 11/28/2018.      Allergies:  Allergies  Allergen Reactions  . Ciprofloxacin Rash  . Statins     Muscle pain and generalized weakness.     Family History: Family History  Problem Relation Age of Onset  . COPD Father        was a smoker  . Arthritis Father   . Asthma Sister   . Cancer Sister     Social History: Social History   Tobacco Use  . Smoking status: Never Smoker  . Smokeless tobacco: Never Used  Substance Use Topics  . Alcohol use: Yes    Alcohol/week: 2.0 standard drinks    Types: 2 Glasses of wine per week  . Drug use: Never   Social History   Social History Narrative   Lives with wife in a one story home.  No children.  Works for [employer].  Education: college.     Review of Systems:  CONSTITUTIONAL: No fevers, chills, night sweats, or weight loss.   EYES: No visual changes or eye pain ENT: No hearing changes.  No history of nose bleeds.   RESPIRATORY: No cough, wheezing and shortness of breath.   CARDIOVASCULAR: Negative for chest pain, and palpitations.   GI: Negative for abdominal discomfort, blood in stools or black  stools.  No recent change in bowel habits.   GU:  No history of incontinence.   MUSCLOSKELETAL: No history of joint pain or swelling.  No myalgias.   SKIN: Negative for lesions, rash, and itching.   HEMATOLOGY/ONCOLOGY: Negative for prolonged bleeding, bruising easily, and swollen nodes.  No history of cancer.   ENDOCRINE: Negative for cold or heat intolerance, polydipsia or goiter.   PSYCH:  No depression or anxiety symptoms.   NEURO: As Above.   Vital Signs:  BP 130/88   Pulse 74   Ht 6' (1.829 m)   Wt 231 lb 8 oz (105 kg)   SpO2 97%   BMI 31.40 kg/m    General Medical Exam:   General:  Well appearing, comfortable.   Eyes/ENT: see cranial nerve examination.  Neck:   No carotid bruits. Respiratory:  Clear to auscultation, good air entry bilaterally.   Cardiac:  Regular rate and rhythm, no murmur.   Extremities:  No deformities, edema, or skin discoloration.  Skin:  No rashes or lesions.  Neurological Exam: MENTAL STATUS including orientation to time, place, person, recent and remote memory, attention span and concentration, language, and fund of knowledge is normal.  Speech is not dysarthric.  CRANIAL NERVES: II:  No visual field defects.  Unremarkable fundi.   III-IV-VI: Pupils equal round and reactive to light.  Normal conjugate, extra-ocular eye movements in all directions of gaze.  No nystagmus.  No ptosis.   V:  Normal facial sensation.    VII:  Normal facial symmetry and movements.   VIII:  Normal hearing and vestibular function.   IX-X:  Normal palatal movement.   XI:  Normal shoulder shrug and head rotation.   XII:  Normal tongue strength and range of motion, no deviation or fasciculation.  MOTOR:  No atrophy, fasciculations or abnormal movements.  No pronator drift.  Tone is normal.    Right Upper Extremity:    Jason Upper Extremity:    Deltoid  5/5   Deltoid  5/5   Biceps  5/5   Biceps  5/5   Triceps  5/5   Triceps  5/5   Wrist extensors  5/5   Wrist  extensors  5/5   Wrist flexors  5/5   Wrist flexors  5/5   Finger extensors  5/5   Finger extensors  5/5   Finger flexors  5/5   Finger flexors  5/5   Dorsal interossei  5/5   Dorsal interossei  5/5   Abductor pollicis  5/5   Abductor pollicis  5/5   Tone (Ashworth scale)  0  Tone (Ashworth scale)  0   Right Lower Extremity:    Jason Lower Extremity:    Hip flexors  5/5   Hip flexors  5/5   Hip extensors  5/5   Hip extensors  5/5   Knee flexors  5/5   Knee flexors  5/5   Knee extensors  5/5   Knee extensors  5/5   Dorsiflexors  5/5   Dorsiflexors  5/5   Plantarflexors  5/5   Plantarflexors  5/5   Toe extensors  5/5   Toe extensors  5/5   Toe flexors  5/5   Toe flexors  5/5   Tone (Ashworth scale)  0  Tone (Ashworth scale)  0   MSRs:  Right                                                                 Jason brachioradialis 2+  brachioradialis 2+  biceps 2+  biceps 2+  triceps 2+  triceps 2+  patellar 2+  patellar 2+  ankle jerk 2+  ankle jerk 2+  Hoffman no  Hoffman no  plantar response down  plantar response down   SENSORY:  Normal and symmetric perception of light touch, pinprick, vibration, and proprioception.  Trace sway with Rhomberg testing. COORDINATION/GAIT: Normal finger-to- nose-finger and heel-to-shin.  Intact rapid alternating movements bilaterally.  Able to rise from a chair without using arms.  Gait narrow based and stable. Tandem and stressed  gait intact.    IMPRESSION: 1.  Embolic Weaver of uncertain source involving the Jason MCA region.  MRI brain was personally viewed with patient which showed punctate infarct over the Jason superior precentral gyrus and Jason anterosuperior frontal cortex.  Exam does not show any residual deficits.  No carotid or intracranial stenosis. Echocardiogram did not reveal thrombus or thrombogenic source, no PFO. I encouraged him to get implantatable loop recorder to evaluate for underlying arrhythmia.  He does not hyperlipidemia and  hypertension, therefore unstable plaque emboli remains a possibility.  Continue secondary Weaver prevention as follows: Continue dual antiplatelet therapy with aspirin 81mg  + plavix 75mg   X 3 months, then monotherapy Continue atorvastatin 40mg  BP is well-controlled off medication, continue to follow.  He does not have diabetes Encouraged him to start exercise program  2.  Distal and symmetric feet paresthesias, most likely early signs of neuropathy. I would like to test for treatable causes of neuropathy. I discussed that in the vast majority of cases, despite checking for reversible causes, we are unable to find the underlying etiology and management is symptomatic.   Check vitamin B1, folate, copper, SPEP with IFE NCS/EMG of the legs - patient will think about this and call the office to schedule  Return to clinic in 3 months.    Thank you for allowing me to participate in patient's care.  If I can answer any additional questions, I would be pleased to do so.    Sincerely,    Nicollette Wilhelmi K. Posey Pronto, DO

## 2018-11-28 ENCOUNTER — Ambulatory Visit: Payer: BLUE CROSS/BLUE SHIELD | Admitting: Neurology

## 2018-11-28 ENCOUNTER — Encounter

## 2018-11-28 ENCOUNTER — Other Ambulatory Visit (INDEPENDENT_AMBULATORY_CARE_PROVIDER_SITE_OTHER): Payer: BLUE CROSS/BLUE SHIELD

## 2018-11-28 ENCOUNTER — Encounter: Payer: Self-pay | Admitting: Neurology

## 2018-11-28 VITALS — BP 130/88 | HR 74 | Ht 72.0 in | Wt 231.5 lb

## 2018-11-28 DIAGNOSIS — I63412 Cerebral infarction due to embolism of left middle cerebral artery: Secondary | ICD-10-CM

## 2018-11-28 DIAGNOSIS — G629 Polyneuropathy, unspecified: Secondary | ICD-10-CM

## 2018-11-28 LAB — FOLATE: Folate: 12.2 ng/mL (ref >5.9)

## 2018-11-28 NOTE — Patient Instructions (Addendum)
Check labs   Please call to schedule NCS/EMG of the legs  The physicians and staff at [facility] are committed to providing excellent care. You may receive a survey requesting feedback about your experience at our office. We strive to receive "very good" responses to the survey questions. If you feel that your experience would prevent you from giving the office a "very good " response, please contact our office to try to remedy the situation. We may be reached at [phone]. Thank you for taking the time out of your busy day to complete the survey.  Return to clinic in 3 months

## 2018-12-02 LAB — PROTEIN ELECTROPHORESIS, SERUM
ALPHA 1: 0.4 g/dL — AB (ref 0.2–0.3)
ALPHA 2: 0.6 g/dL (ref 0.5–0.9)
Albumin ELP: 4.4 g/dL (ref 3.8–4.8)
Beta 2: 0.3 g/dL (ref 0.2–0.5)
Beta Globulin: 0.5 g/dL (ref 0.4–0.6)
Gamma Globulin: 1 g/dL (ref 0.8–1.7)
Total Protein: 7.2 g/dL (ref 6.1–8.1)

## 2018-12-02 LAB — IMMUNOFIXATION ELECTROPHORESIS
IGG (IMMUNOGLOBIN G), SERUM: 1087 mg/dL (ref 600–1640)
IgM, Serum: 71 mg/dL (ref 50–300)
Immunoglobulin A: 132 mg/dL (ref 47–310)

## 2018-12-02 LAB — VITAMIN B1: Vitamin B1 (Thiamine): 8 nmol/L (ref 8–30)

## 2018-12-02 LAB — COPPER, SERUM: Copper: 125 ug/dL (ref 70–175)

## 2018-12-03 ENCOUNTER — Telehealth: Payer: Self-pay | Admitting: *Deleted

## 2018-12-03 NOTE — Telephone Encounter (Signed)
Patient given results

## 2018-12-03 NOTE — Telephone Encounter (Signed)
-----   Message from [name], DO sent at 12/03/2018 12:59 PM EST ----- Please notify patient lab are within normal limits.  Thank you.

## 2018-12-05 ENCOUNTER — Encounter: Payer: Self-pay | Admitting: Cardiology

## 2018-12-05 ENCOUNTER — Ambulatory Visit: Payer: BLUE CROSS/BLUE SHIELD | Admitting: Cardiology

## 2018-12-05 VITALS — BP 129/85 | HR 66 | Ht 72.0 in | Wt 232.8 lb

## 2018-12-05 DIAGNOSIS — E7800 Pure hypercholesterolemia, unspecified: Secondary | ICD-10-CM | POA: Diagnosis not present

## 2018-12-05 DIAGNOSIS — R0683 Snoring: Secondary | ICD-10-CM | POA: Diagnosis not present

## 2018-12-05 DIAGNOSIS — I639 Cerebral infarction, unspecified: Secondary | ICD-10-CM

## 2018-12-05 HISTORY — DX: Pure hypercholesterolemia, unspecified: E78.00

## 2018-12-05 MED ORDER — CLOPIDOGREL BISULFATE 75 MG PO TABS: 75.0000 mg | ORAL_TABLET | Freq: Every day | ORAL | 1 refills | Status: AC

## 2018-12-05 NOTE — Progress Notes (Signed)
Subjective:  Primary Physician:  Vivi Barrack, MD  Patient ID: Jason Weaver, male    DOB: 02-May-1956, 63 y.o.   MRN: [id number]  Chief Complaint  Patient presents with  . Follow-up    7-10 day TEE. Stroke    HPI: NICODEMUS DENK  is a 63 y.o. male  with a cerebrovascular accident, hyperlipidemia, and pulmonary sarcoidosis, mild obesity, presents for f/u of CVA.   Mr Walther Sanagustin is a Caucasian male who woke up with symptoms on 11/09/18 with heaviness in his right arm and loss of dexterity.  Outpatient MRI which confirmed left MCA scattered infarcts in a embolic pattern. Had negative lower extremity duplex, normal echocardiogram, source for embolic CVA not determined.   He now presents to discuss TEE findings and possible loop recorder implantation for evaluation of cardioembolic source. He was discharged on 11/15/2018 with dual antiplatelet therapy and Lipitor. He has not previously tolerated statins in the past, but at discharge was agreeable to restarting Lipitor as his LDL was 160. Currently tolerating well.  Patient has had resolution of right arm dexterity since being home from the hospital.  He was told To have sleep apnea more than 10 years ago.  He does advise me that his wife has observed him snoring very loudly and potentially having apneic episodes.  He does exercise at the gym several days a week and also walks his dogs at home. No former tobacco use. Maternal grandfather had CVA in his 2's. No other family history of heart disease.   Past Medical History:  Diagnosis Date  . Cardiomyopathy, nonischemic ([place])   . Diverticulosis of colon   . ED (erectile dysfunction)   . Gout   . Hearing loss   . Hx of colonic polyps   . Hypercholesteremia 12/05/2018  . Hyperlipidemia   . Nasal septal deviation   . Obesity   . OSA (obstructive sleep apnea)   . PAF (paroxysmal atrial fibrillation) ([place])   . Pulmonary sarcoidosis ([place])  1997    biopsy proven    Past  Surgical History:  Procedure Laterality Date  . CATARACT EXTRACTION    . HERNIA REPAIR  2009  . LUNG BIOPSY  1997  . TEE WITHOUT CARDIOVERSION N/A 11/25/2018   Procedure: TRANSESOPHAGEAL ECHOCARDIOGRAM (TEE);  Surgeon: Nigel Mormon, MD;  Location[facility] ENDOSCOPY;  Service: Cardiovascular;  Laterality: N/A;  . TONSILLECTOMY  1979    Social History   Socioeconomic History  . Marital status: Married    Spouse name: Not on file  . Number of children: 0  . Years of education: 16  . Highest education level: Bachelor's degree (e.g., BA, AB, BS)  Occupational History  . Occupation: Herbalist: [employer]  . Financial resource strain: Not on file  . Food insecurity:    Worry: Not on file    Inability: Not on file  . Transportation needs:    Medical: Not on file    Non-medical: Not on file  Tobacco Use  . Smoking status: Never Smoker  . Smokeless tobacco: Never Used  Substance and Sexual Activity  . Alcohol use: Yes    Alcohol/week: 2.0 standard drinks    Types: 2 Glasses of wine per week  . Drug use: Never  . Sexual activity: Yes  Lifestyle  . Physical activity:    Days per week: Not on file    Minutes per session: Not on file  .  Stress: Not on file  Relationships  . Social connections:    Talks on phone: Not on file    Gets together: Not on file    Attends religious service: Not on file    Active member of club or organization: Not on file    Attends meetings of clubs or organizations: Not on file    Relationship status: Not on file  . Intimate partner violence:    Fear of current or ex partner: Not on file    Emotionally abused: Not on file    Physically abused: Not on file    Forced sexual activity: Not on file  Other Topics Concern  . Not on file  Social History Narrative   Lives with wife in a one story home.  No children.  Works for [employer].  Education: college.     Current Outpatient Medications on File Prior to Visit    Medication Sig Dispense Refill  . albuterol (PROAIR HFA) 108 (90 Base) MCG/ACT inhaler INHALE 2 PUFFS INTO THE LUNGS EVERY 6 (SIX) HOURS AS NEEDED FOR WHEEZING. 8.5 each 4  . Ascorbic Acid (VITAMIN C) 1000 MG tablet Take 1,000 mg by mouth daily.    Marland Kitchen aspirin EC 81 MG EC tablet Take 1 tablet (81 mg total) by mouth daily. 30 tablet 0  . atorvastatin (LIPITOR) 40 MG tablet Take 1 tablet (40 mg total) by mouth daily at 6 PM. 30 tablet 0  . Cyanocobalamin (VITAMIN B 12 PO) Take 1 tablet by mouth daily.    . diphenhydrAMINE (BENADRYL) 25 MG tablet Take 25 mg by mouth every 6 (six) hours as needed for allergies.    . hydrocortisone (ANUSOL-HC) 2.5 % rectal cream Place 1 application rectally 2 (two) times daily as needed for hemorrhoids (itching).    . metroNIDAZOLE (FLAGYL) 500 MG tablet Take 1 tablet (500 mg total) by mouth 3 (three) times daily as needed (flareup).    . Pyridoxine HCl (VITAMIN B-6 PO) Take 1 tablet by mouth daily.    . valACYclovir (VALTREX) 1000 MG tablet Take 1 g by mouth as directed. Take 1 tablet daily for 3-4 days as needed for flare ups  2  . vitamin E 100 UNIT capsule Take 100 Units by mouth daily.     No current facility-administered medications on file prior to visit.      Review of Systems  Constitutional: Negative for malaise/fatigue and weight loss.  Respiratory: Negative for cough, hemoptysis and shortness of breath.   Cardiovascular: Negative for chest pain, palpitations, claudication and leg swelling.  Gastrointestinal: Negative for abdominal pain, blood in stool, constipation, heartburn and vomiting.  Genitourinary: Negative for dysuria.  Musculoskeletal: Positive for neck pain (since stroke has tingling and numbness both arm in morning). Negative for joint pain and myalgias.  Neurological: Negative for dizziness, focal weakness and headaches.  Endo/Heme/Allergies: Does not bruise/bleed easily.  Psychiatric/Behavioral: Negative for depression. The patient is  not nervous/anxious.   All other systems reviewed and are negative.      Objective:  Blood pressure 129/85, pulse 66, height 6' (1.829 m), weight 232 lb 12.8 oz (105.6 kg), SpO2 97 %. Body mass index is 31.57 kg/m.  Physical Exam  Constitutional: He appears well-developed and well-nourished. No distress.  HENT:  Head: Atraumatic.  Eyes: Conjunctivae are normal.  Neck: Neck supple. No JVD present. No thyromegaly present.  Cardiovascular: Normal rate, regular rhythm, normal heart sounds and intact distal pulses. Exam reveals no gallop.  No murmur heard. Pulmonary/Chest:  Effort normal and breath sounds normal.  Abdominal: Soft. Bowel sounds are normal.  Musculoskeletal: Normal range of motion.        General: No edema.  Neurological: He is alert.  Skin: Skin is warm and dry.  Psychiatric: He has a normal mood and affect.     CARDIAC STUDIES:   MRI brain 11/14/2018: 1. Small early subacute infarction within the left superior precentral gyrus and additional punctate focus in the left anterosuperior frontal cortex. No associated hemorrhage or mass effect. 2. Mild chronic microvascular ischemic changes and volume loss of the brain. Mild progression from 2009.  CTA head and neck 11/15/2018: Mild atherosclerosis without flow limiting stenosis or evident embolic source in the head and neck.  Echocardiogram 11/15/2018: Normal LV size, normal LV systolic function, EF 25-95%. No other significant abnormality noted.  TEE on 03/24/86:  No cardioembolic source for CVA identified.   Assessment & Recommendations:    Recommendation: Mr Alfonzia Woolum is a Caucasian male who woke up with symptoms on 11/09/18 with heaviness in his right arm and loss of dexterity. On MRI on 11/12/2018, he was found to have left MCA scattered embolic pattern infarct. Her to be cryptogenic stroke, was discharged home and recommended to follow-up with me. D/C from hospital on 11/15/18. PMH: OSA diagnosed 10 years  ago and mixed hyperlipidemia. No hypertension or DM or F/H of vascular disease.  Is presently doing well and symptoms of stroke has essentially resolved. Still has mild mouth tingling sensation. I reviewed the results of the TEE with the patient. As discussed by his neurologist Dr. Rosalin Hawking, advised him he will need a loop recorder implantation to evaluate for paroxysmal atrial fibrillation. I also discussed with him regarding at least using a 30 day event monitor to capture any atrial arrhythmias.  He has risk factors for atrial fibrillation that includes prior OSA mild obesity is another risk factor for atrial fibrillation. I have also referred him to be evaluated by Dr., Dohmeier for sleep study.  After long discussion, patient would like to think about either at least an event monitor if not the loop recorder and then get back with me.  For now I advised him to continue dual antiplatelet therapy.  He'll continue to follow-up with neurology.  He has had hyperlipidemia and previously had not been able to tolerate statins, he is presently on moderate dose Lipitor.   Adrian Prows, MD, [facility] 12/05/2018, 5:59 PM [facility] [place] Pager: [phone] Office: [phone] If no answer Cell [phone]

## 2018-12-11 ENCOUNTER — Encounter: Payer: Self-pay | Admitting: Neurology

## 2018-12-11 ENCOUNTER — Institutional Professional Consult (permissible substitution): Payer: BLUE CROSS/BLUE SHIELD | Admitting: Neurology

## 2018-12-11 ENCOUNTER — Ambulatory Visit: Payer: BLUE CROSS/BLUE SHIELD | Admitting: Neurology

## 2018-12-11 VITALS — BP 116/81 | HR 63 | Ht 72.0 in | Wt 237.0 lb

## 2018-12-11 DIAGNOSIS — I639 Cerebral infarction, unspecified: Secondary | ICD-10-CM | POA: Diagnosis not present

## 2018-12-11 DIAGNOSIS — Z9989 Dependence on other enabling machines and devices: Secondary | ICD-10-CM

## 2018-12-11 DIAGNOSIS — J322 Chronic ethmoidal sinusitis: Secondary | ICD-10-CM

## 2018-12-11 DIAGNOSIS — G4700 Insomnia, unspecified: Secondary | ICD-10-CM

## 2018-12-11 DIAGNOSIS — J342 Deviated nasal septum: Secondary | ICD-10-CM

## 2018-12-11 DIAGNOSIS — G4733 Obstructive sleep apnea (adult) (pediatric): Secondary | ICD-10-CM

## 2018-12-11 DIAGNOSIS — I480 Paroxysmal atrial fibrillation: Secondary | ICD-10-CM

## 2018-12-11 NOTE — Progress Notes (Signed)
SLEEP MEDICINE CLINIC   Provider:  Larey Seat, MD   Primary Care Physician:  Vivi Barrack, MD   Referring Provider: Adrian Prows, MD / stroke service   Chief Complaint  Patient presents with  . New Patient (Initial Visit)    pt alone, rm 11. pt had a stroke 2/19, had all of his follow up's completed. the patient had it while he was sleeping his cardiologist wants him to complete a sleep study to see if sleep apnea is still present. he had a study completed in 2012 (?) was diagnosed and given a CPAP. had not used the CPAP for years. sleep study was completed in [place]    HPI:  Jason Weaver is a 63 y.o. male patient, seen on 12-11-2018 in a referral from Dr. Einar Gip.   I have the pleasure of meeting Jason Weaver as presented today on 11 December 2018, I had seen him probably around 11 years ago for acute hearing loss in the left ear that he suffered during a skiing trip.  His hearing has never returned.  He is seen here today because he presented to the hospital on 09 November 2018 with heaviness in his right arm and loss of dexterity.  He also had facial numbness and tingling.  He went by private car to the emergency room where he waited 6 hours and left.  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  He was evaluated by lower extremity duplex which was negative had a normal echo and TEE, a source for an embolic CVA was not determined.  He was discharged on 11 November 2018 with his dual antiplatelet therapy and Lipitor.  The arm clumsiness has meanwhile resolved and the search for other possible risk factors it also came up that the patient had been tested for sleep apnea more than 10 years ago and that his wife has concerned about him snoring very loudly and potentially having apneic episodes.  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  We are meeting today to reinvestigate if his apnea is well treated, and if he needs any additional possible treatment.  I also reviewed his medication list which is stated below.  The MRI from 14 November 2018 showed small early subacute infarction within the left superior precentral gyrus and additional punctate lesions in the left anterior superior frontal cortex there was no mass-effect or edema no hemorrhage noted there is a mild progression of microvascular changes since 2009 when I had ordered his last MRI following his hearing loss.  The patient underwent an nocturnal polysomnogram study on 14 May 2011 with Dr. Elsworth Weaver at the [facility] sleep lab, he did not have significant arousals, his AHI at baseline was determined by an apnea link study at 16/h there were no central apneas noted and no significant desaturations.  He was titrated to 11 cmH2O using a full facemask.  Hypopneas resolved slower than the apneas, he was also forced to sleep supine but it is his sleep habits to sleep on the side and his current fullface facemask does interfere with this.  The patient is using a CPAP by [employer], this is a C-Flex machine- his compliance was 56.7% also it includes 3 days in the hospital during which she could not  have used his machine.  The average usage is 4 hours 57 minutes at night, the residual AHI is very low 0.3 apneas per hour, the machine is set to 11 cmH2O with a C-Flex function.  The ramp starts at a pressure of 5 cmH2O over 15 minutes achieving the maximum pressure.  Chief complaint according to patient : CVA risk factor management.   Sleep habits are as follows:  6.30 to 7.30 is dinner, he watches TV or reads. He goes to bed between 9 and 10 Pm, and wakes up at 2 AM- unknown trigger- He still snores. No palpitations, no diaphoresis, no  chest pain. He wakes and  and his brain starts racing- trouble to go back to sleep. Uses Ambien and melatonin. Has reported a muscle tension over the neck. He rises between 5-6 Am and goes to the gym.  He reports dry mouth. No headaches,  No tinnitus.    Sleep medical history:  Septal deviation to the left-   No Family history of sleep apnea. MGF had a stroke.     Social history: married, still working, non smoker, red wine drinker- with dinner 2 glasses. Caffeine use; one cup in AM- 1 soda everey other week, no tea.     Review of Systems: Out of a complete 14 system review, the patient complains of only the following symptoms, and all other reviewed systems are negative.  Dry mouth, stroke symptoms.  Epworth Sleep score :  5/ 24  Fatigue severity score:  15/63  Depression score;  Geriatric 0/15   Social History   Socioeconomic History  . Marital status: Married    Spouse name: Not on file  . Number of children: 0  . Years of education: 16  . Highest education level: Bachelor's degree (e.g., BA, AB, BS)  Occupational History  . Occupation: Herbalist: [employer]  . Financial resource strain: Not on file  . Food insecurity:    Worry: Not on file    Inability: Not on file  . Transportation needs:    Medical: Not on file    Non-medical: Not on file  Tobacco Use  . Smoking status: Never Smoker  . Smokeless tobacco: Never Used  Substance and Sexual Activity  . Alcohol use: Yes    Alcohol/week: 2.0 standard drinks    Types: 2 Glasses of wine per week  . Drug use: Never  . Sexual activity: Yes  Lifestyle  . Physical activity:    Days per week: Not on file    Minutes per session: Not on file  . Stress: Not on file  Relationships  . Social connections:    Talks on phone: Not on file    Gets together: Not on file    Attends religious service: Not on file    Active member of club or organization: Not on file    Attends meetings of clubs or  organizations: Not on file    Relationship status: Not on file  . Intimate partner violence:    Fear of current or ex partner: Not on file    Emotionally abused: Not on file    Physically abused: Not on file    Forced sexual activity: Not on file  Other Topics Concern  . Not on file  Social History Narrative   Lives with wife in a one story home.  No children.  Works for [employer].  Education: college.     Family History  Problem Relation Age of Onset  . COPD Father        was a smoker  . Arthritis Father   . Asthma Sister   . Cancer Sister   . Alcohol abuse Brother     Past Medical History:  Diagnosis Date  . Cardiomyopathy, nonischemic ([place])   . Diverticulosis of colon   . ED (erectile dysfunction)   . Gout   . Hearing loss   . Hx of colonic polyps   . Hypercholesteremia 12/05/2018  . Hyperlipidemia   . Nasal septal deviation   . Obesity   . OSA (obstructive sleep apnea)   . PAF (paroxysmal atrial fibrillation) ([place])   . Pulmonary sarcoidosis ([place])  1997    biopsy proven    Past Surgical History:  Procedure Laterality Date  . CATARACT EXTRACTION    . HERNIA REPAIR  2009  . LUNG BIOPSY  1997  . TEE WITHOUT CARDIOVERSION N/A 11/25/2018   Procedure: TRANSESOPHAGEAL ECHOCARDIOGRAM (TEE);  Surgeon: Nigel Mormon, MD;  Location: [facility] ENDOSCOPY;  Service: Cardiovascular;  Laterality: N/A;  . TONSILLECTOMY  1979    Current Outpatient Medications  Medication Sig Dispense Refill  . albuterol (PROAIR HFA) 108 (90 Base) MCG/ACT inhaler INHALE 2 PUFFS INTO THE LUNGS EVERY 6 (SIX) HOURS AS NEEDED FOR WHEEZING. 8.5 each 4  . Ascorbic Acid (VITAMIN C) 1000 MG tablet Take 1,000 mg by mouth daily.    Marland Kitchen aspirin EC 81 MG EC tablet Take 1 tablet (81 mg total) by mouth daily. 30 tablet 0  . atorvastatin (LIPITOR) 40 MG tablet Take 1 tablet (40 mg total) by mouth daily at 6 PM. 30 tablet 0  . clopidogrel (PLAVIX) 75 MG tablet Take 1 tablet (75 mg total) by mouth daily. 90 tablet 1    . Cyanocobalamin (VITAMIN B 12 PO) Take 1 tablet by mouth daily.    . diphenhydrAMINE (BENADRYL) 25 MG tablet Take 25 mg by mouth every 6 (six) hours as needed for allergies.    . hydrocortisone (ANUSOL-HC) 2.5 % rectal cream Place 1 application rectally 2 (two) times daily as needed for hemorrhoids (itching).    . metroNIDAZOLE (FLAGYL) 500 MG tablet Take 1 tablet (500 mg total) by mouth 3 (three) times daily as needed (flareup).    . Pyridoxine HCl (VITAMIN B-6 PO) Take 1 tablet by mouth daily.    . valACYclovir (VALTREX) 1000 MG tablet Take 1 g by mouth as directed. Take 1 tablet daily for 3-4 days as needed for flare ups  2  . vitamin E 100 UNIT capsule Take 100 Units by mouth daily.     No current facility-administered medications for this visit.     Allergies as of 12/11/2018 - Review Complete 12/11/2018  Allergen Reaction Noted  . Ciprofloxacin Rash 02/16/2016  . Statins  11/15/2018    Vitals: BP 116/81   Pulse 63   Ht 6' (1.829 m)   Wt 237 lb (107.5 kg)   BMI 32.14 kg/m  Last Weight:  Wt Readings from Last 1 Encounters:  12/11/18 237 lb (107.5 kg)   ENI:DPOE mass index is 32.14 kg/m.     Last Height:   Ht Readings from Last 1 Encounters:  12/11/18 6' (1.829 m)    Physical exam:  General: The patient is awake, alert and appears not in acute distress. The patient is well groomed. Head: Normocephalic, atraumatic. Neck is supple. Mallampati ,  neck circumference:17" . Nasal airflow congested , right nasion is blocked.  TMJ click is not evident . Retrognathia is not seen. (Wore braces).  Cardiovascular:  Regular rate and rhythm , without  murmurs or carotid bruit, and without distended neck veins. Respiratory: Lungs are clear to auscultation. Skin:  Without evidence of edema, or rash Trunk: BMI is 31/ kg/m2. The patient's posture is erect.  Neurologic exam :The patient is awake and alert, oriented to place and time.   Memory subjective described as intact. Attention  span & concentration ability appears normal. Speech is fluent, without dysarthria, dysphonia or aphasia. Mood and affect are appropriate.  Cranial nerves: Pupils are equal and briskly reactive to light. Funduscopic exam without evidence of pallor or edema. Extraocular movements  in vertical and horizontal planes intact and without nystagmus. Visual fields by finger perimetry are intact. Hearing loss in the left - right intact.   Facial sensation intact to fine touch.  Facial motor strength is symmetric and tongue and uvula move midline. Shoulder shrug was symmetrical.   Motor exam:  Normal tone, muscle bulk and symmetric strength in all extremities.  Sensory:  Fine touch, pinprick and vibration were tested in all extremities. Proprioception tested in the upper extremities was normal.  Coordination:  Finger-to-nose maneuver  normal without evidence of ataxia, dysmetria or tremor.  Gait and station: Patient walks without assistive device and is able unassisted to climb up to the exam table. Strength within normal limits.  Stance is stable and normal. His vestibular sytem is impaired.   Toe  stand was tested .Marland Kitchen Deep tendon reflexes: in the  upper and lower extremities are symmetric and intact.     Assessment:  After physical and neurologic examination, review of laboratory studies,  Personal review of imaging studies, reports of other /same  Imaging studies, results of polysomnography and / or neurophysiology testing and pre-existing records as far as provided in visit., my assessment is   1) recheck for OSA- degree and best treatment -he has used a 61 year old CPAP currently, he dislikes the FFM intensely, has insomnia.   2) atrial fib will be his main risk factor , having ruled out other sources. He needs a loop recorder.   The patient was advised of the nature of the diagnosed disorder , the treatment options and the  risks for general health and wellness arising from not treating the  condition.   I spent more than 40 minutes of face to face time with the patient.  Greater than 50% of time was spent in counseling and coordination of care. We have discussed the diagnosis and differential and I answered the patient's questions.    Plan:  Treatment plan and additional workup :   I like for him to undergo a new attended sleep study which will allow me to see his heart rate and rhythm at night, in sleep. A HST would not show me this detail.   TECHNOLOGIST- patient with acute stroke 11-11-2018, needs RERAS, hates FFM- wants to sleep on his side.  try nasal mask.   Referral to ENT for septal deviation and correction. Sinuitis.    Larey Seat, MD 0/81/4481, 85:63 AM  Certified in Neurology by ABPN Certified in [place] by [facility] [address] [place], [place] [address]

## 2018-12-31 ENCOUNTER — Other Ambulatory Visit: Payer: Self-pay

## 2018-12-31 MED ORDER — ATORVASTATIN CALCIUM 40 MG PO TABS: 40.0000 mg | ORAL_TABLET | Freq: Every day | ORAL | 0 refills | Status: DC

## 2019-01-06 DIAGNOSIS — Z9989 Dependence on other enabling machines and devices: Secondary | ICD-10-CM | POA: Diagnosis not present

## 2019-01-06 DIAGNOSIS — J342 Deviated nasal septum: Secondary | ICD-10-CM | POA: Diagnosis not present

## 2019-01-06 DIAGNOSIS — G4733 Obstructive sleep apnea (adult) (pediatric): Secondary | ICD-10-CM | POA: Diagnosis not present

## 2019-01-06 DIAGNOSIS — J302 Other seasonal allergic rhinitis: Secondary | ICD-10-CM | POA: Diagnosis not present

## 2019-01-08 ENCOUNTER — Ambulatory Visit (INDEPENDENT_AMBULATORY_CARE_PROVIDER_SITE_OTHER): Payer: BLUE CROSS/BLUE SHIELD | Admitting: Neurology

## 2019-01-08 ENCOUNTER — Other Ambulatory Visit: Payer: Self-pay

## 2019-01-08 DIAGNOSIS — Z9989 Dependence on other enabling machines and devices: Secondary | ICD-10-CM

## 2019-01-08 DIAGNOSIS — G4700 Insomnia, unspecified: Secondary | ICD-10-CM

## 2019-01-08 DIAGNOSIS — G4733 Obstructive sleep apnea (adult) (pediatric): Secondary | ICD-10-CM | POA: Diagnosis not present

## 2019-01-08 DIAGNOSIS — I480 Paroxysmal atrial fibrillation: Secondary | ICD-10-CM

## 2019-01-08 DIAGNOSIS — I639 Cerebral infarction, unspecified: Secondary | ICD-10-CM

## 2019-01-09 NOTE — Procedures (Signed)
PATIENT'S NAME:  Jason Weaver, Jason Weaver DOB:      November 12, 1955      MR#:    [id number]     DATE OF RECORDING: 01/08/2019 REFERRING M.D.:  Adrian Prows MD Study Performed:   Baseline Polysomnogram HISTORY:  The patient had been tested for sleep apnea more than 10 years ago. His wife has concerned about him snoring very loudly and potentially having apneic episodes. We are meeting today to reinvestigate if his apnea is well treated, and if he needs any additional possible treatment. The patient underwent a nocturnal polysomnogram study on 14 May 2011 with Dr. Elsworth Soho; AHI at baseline was determined by an apnea link study at 16/h there were no central apneas noted and no significant desaturations.  He was titrated to 11 cmH2O using a full facemask.  He was forced to sleep supine but it is his sleep habits to sleep on the side and his current full face mask does interfere with this.   The patient is using a CPAP by [employer], this is a C-Flex machine- , the machine is set to 11 cmH2O with a C-Flex function.  The ramp starts at a pressure of 5 cmH2O over 15 minutes achieving the maximum pressure. Chief complaint according to patient: CVA risk factor management. Cardiomyopathy, Diverticulosis, ED, Gout, Hearing loss, Hx of colon polyps, Hypocholesteremia, Hyperlipidemia, Nasal septal deviation, Obesity, PAF, Pulmonary sarcoidosis.  The patient endorsed the Epworth Sleepiness Scale at 5 points.   The patient's weight 237 pounds with a height of 72 (inches), resulting in a BMI of 32.2 kg/m2. The patient's neck circumference measured 17 inches.  CURRENT MEDICATIONS: ProAir HFA, Vit C, Lipitor, Plavix, Vit B12, Benadryl, Anusol cream, Flagyl, Vit B6, Valtrex, Vit E   PROCEDURE:  This is a multichannel digital polysomnogram utilizing the Somnostar 11.2 system.  Electrodes and sensors were applied and monitored per AASM Specifications.   EEG, EOG, Chin and Limb EMG, were sampled at 200 Hz.  ECG, Snore and Nasal  Pressure, Thermal Airflow, Respiratory Effort, CPAP Flow and Pressure, Oximetry was sampled at 50 Hz. Digital video and audio were recorded.      BASELINE STUDY: Lights Out was at 21:16 and Lights On at 04:33.  Total recording time (TRT) was 437 minutes, with a total sleep time (TST) of 377 minutes.   The patient's sleep latency was 11.5 minutes.  REM latency was 75 minutes.  The sleep efficiency was 86.3 %.     SLEEP ARCHITECTURE: WASO (Wake after sleep onset) was 48 minutes.  There were 37.5 minutes in Stage N1, 194 minutes Stage N2, 41 minutes Stage N3 and 104.5 minutes in Stage REM.  The percentage of Stage N1 was 9.9%, Stage N2 was 51.5%, Stage N3 was 10.9% and Stage R (REM sleep) was 27.7%.    RESPIRATORY ANALYSIS:  There were a total of 121 respiratory events:  0 obstructive apneas, 0 central apneas and 0 mixed apneas with a total of 0 apneas and an apnea index (AI) of 0 /hour. There were 121 hypopneas with a hypopnea index of 19.3 /hour.    The total APNEA/HYPOPNEA INDEX (AHI) was 19.3 /hour.  69 events occurred in REM sleep and 104 events in NREM. The REM AHI was 0. 39.6 /hour, versus a non-REM AHI of 11.4. The patient spent 222.5 minutes of total sleep time in the supine position and 155 minutes in non-supine. The supine AHI was 23.2 versus a non-supine AHI of 13.6.  OXYGEN SATURATION & C02:  The  Wake baseline 02 saturation was 92%, with the lowest being 85%. Time spent below 89% saturation equaled 30 minutes.   PERIODIC LIMB MOVEMENTS: The arousals were noted as: 23 were spontaneous, 4 were associated with PLMs, and 92 were associated with respiratory events. The patient had a total of 197 Periodic Limb Movements.  The Periodic Limb Movement (PLM) index was 31.4 and the PLM Arousal index was .6/hour.   Audio and video analysis did not show any abnormal or unusual movements, behaviors, phonations or vocalizations.   Snoring was noted. EKG was in keeping with normal sinus rhythm  (NSR). Post-study, the patient indicated that sleep was the same as usual.    IMPRESSION:  Moderate Obstructive Sleep Apnea (OSA) confirmed.  RECOMMENDATIONS:  Auto titration device 5-18 cm water with 3 cm EPR.  I certify that I have reviewed the entire raw data recording prior to the issuance of this report in accordance with the Standards of Accreditation of the [place] Academy of [employer] (AASM)    Larey Seat, MD   01-09-2019 Diplomat, American Board of Psychiatry and Neurology  Diplomat, American Board of [place] Director, [place] [facility] [employer]

## 2019-01-09 NOTE — Addendum Note (Signed)
Addended by: Larey Seat on: 01/09/2019 12:18 PM   Modules accepted: Orders

## 2019-01-14 ENCOUNTER — Encounter: Payer: Self-pay | Admitting: Neurology

## 2019-01-15 ENCOUNTER — Telehealth: Payer: Self-pay | Admitting: Neurology

## 2019-01-15 NOTE — Telephone Encounter (Signed)
Our sleep lab technician called pt. Advised pt that Dr. Brett Fairy reviewed their sleep study results and found that pt has moderate sleep apnea. Dr. Brett Fairy recommends that pt starts auto CPAP. I reviewed PAP compliance expectations with the pt. Pt is agreeable to starting a CPAP. I advised pt that an order will be sent to a DME, [facility], and [facility] will call the pt within about one week after they file with the pt's insurance. [facility] will show the pt how to use the machine, fit for masks, and troubleshoot the CPAP if needed. A follow up appt was made for insurance purposes with Dr. Brett Fairy on 05/13/2019. Pt verbalized understanding to arrive 15 minutes early and bring their CPAP. A letter with all of this information in it will be mailed to the pt as a reminder. I verified with the pt that the address we have on file is correct. Pt verbalized understanding of results. Pt had no questions at this time but was encouraged to call back if questions arise. I have sent the order to [facility] and have received confirmation that they have received the order.

## 2019-01-15 NOTE — Telephone Encounter (Signed)
-----   Message from Larey Seat, MD sent at 01/09/2019 12:18 PM EDT ----- IMPRESSION: Moderate Obstructive Sleep Apnea (OSA) confirmed. In order to protect the patient from atrial fibrillation and embolic stroke CPAP therapy needs to continue.    RECOMMENDATIONS:  Auto titration device 5-18 cm water with 3 cm EPR.

## 2019-01-26 DIAGNOSIS — G4733 Obstructive sleep apnea (adult) (pediatric): Secondary | ICD-10-CM | POA: Diagnosis not present

## 2019-01-27 ENCOUNTER — Encounter: Payer: Self-pay | Admitting: Neurology

## 2019-01-27 ENCOUNTER — Other Ambulatory Visit: Payer: Self-pay | Admitting: *Deleted

## 2019-01-27 ENCOUNTER — Encounter: Payer: Self-pay | Admitting: *Deleted

## 2019-01-27 ENCOUNTER — Other Ambulatory Visit: Payer: Self-pay

## 2019-01-27 ENCOUNTER — Telehealth (INDEPENDENT_AMBULATORY_CARE_PROVIDER_SITE_OTHER): Payer: BLUE CROSS/BLUE SHIELD | Admitting: Neurology

## 2019-01-27 VITALS — Ht 72.0 in | Wt 230.0 lb

## 2019-01-27 DIAGNOSIS — I63412 Cerebral infarction due to embolism of left middle cerebral artery: Secondary | ICD-10-CM | POA: Diagnosis not present

## 2019-01-27 DIAGNOSIS — E782 Mixed hyperlipidemia: Secondary | ICD-10-CM

## 2019-01-27 DIAGNOSIS — G629 Polyneuropathy, unspecified: Secondary | ICD-10-CM

## 2019-01-27 NOTE — Progress Notes (Signed)
Virtual Visit via Video Note The purpose of this virtual visit is to provide medical care while limiting exposure to the novel coronavirus.    Consent was obtained for video visit:  Yes.   Answered questions that patient had about telehealth interaction:  Yes.   I discussed the limitations, risks, security and privacy concerns of performing an evaluation and management service by telemedicine. I also discussed with the patient that there may be a patient responsible charge related to this service. The patient expressed understanding and agreed to proceed.  Pt location: Home Physician Location: office Name of referring provider:  Vivi Barrack, MD I connected with Jason Fillers Emmer at patients initiation/request on 01/27/2019 at  1:00 PM EDT by video enabled telemedicine application and verified that I am speaking with the correct person using two identifiers. Pt MRN:  [id number] Pt DOB:  Jun 12, 1956 Video Participants:  Jason Weaver   History of Present Illness: This is a 63 y.o. 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  He has been compliant taking dual antiplatelet therapy and statin therapy.  He did see Dr. Einar Gip soon after his last visit with me who recommended loop recorder, however, patient did not follow-up with him again.  He denies any new neurological symptoms such as face or limb weakness/paresthesias.  He no longer has weakness or lack of coordination of the right hand.    Numbness and tingling of the toes is slightly improved, as compared to his last visit. Neuropathy labs returned normal.  He did not wish to pursue EMG due to mild symptoms.   Balance is good.  No falls, recent illness, or interval hospitalization.     Past Medical History:  Diagnosis Date  . Cardiomyopathy, nonischemic ([place])   . Diverticulosis of colon   . ED (erectile dysfunction)   . Gout   . Hearing loss   .  Hx of colonic polyps   . Hypercholesteremia 12/05/2018  . Hyperlipidemia   . Nasal septal deviation   . Obesity   . OSA (obstructive sleep apnea)   . PAF (paroxysmal atrial fibrillation) ([place])   . 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 albuterol (PROAIR HFA) 108 (90 Base) MCG/ACT inhaler INHALE 2 PUFFS INTO THE LUNGS EVERY 6 (SIX) HOURS AS NEEDED FOR WHEEZING. 8.5 each 4  . Ascorbic Acid (VITAMIN C) 1000 MG tablet Take 1,000 mg by mouth daily.    Marland Kitchen aspirin EC 81 MG EC tablet Take 1 tablet (81 mg total) by mouth daily. 30 tablet 0  . atorvastatin (LIPITOR) 40 MG tablet Take 1 tablet (40 mg total) by mouth daily at 6 PM. 90 tablet 0  . clopidogrel (PLAVIX) 75 MG tablet Take 1 tablet (75 mg total) by mouth daily. 90 tablet 1  . Cyanocobalamin (VITAMIN B 12 PO) Take 1 tablet by mouth daily.    . diphenhydrAMINE (BENADRYL) 25 MG tablet Take 25 mg by mouth every 6 (six) hours as needed for allergies.    . hydrocortisone (ANUSOL-HC) 2.5 % rectal cream Place 1 application rectally 2 (two) times daily as needed for hemorrhoids (itching).    . metroNIDAZOLE (FLAGYL) 500 MG tablet Take 1 tablet (500 mg total) by mouth 3 (three) times daily as needed (flareup).    . Pyridoxine HCl (VITAMIN B-6  PO) Take 1 tablet by mouth daily.    . valACYclovir (VALTREX) 1000 MG tablet Take 1 g by mouth as directed. Take 1 tablet daily for 3-4 days as needed for flare ups  2  . vitamin E 100 UNIT capsule Take 100 Units by mouth daily.     No current facility-administered medications on file prior to visit.      Review of Systems:  CONSTITUTIONAL: No fevers, chills, night sweats, or weight loss.  EYES: No visual changes or eye pain ENT: No hearing changes.  No history of nose bleeds.   RESPIRATORY: No cough, wheezing and shortness of breath.   CARDIOVASCULAR: Negative for chest pain, and palpitations.   GI:  Negative for abdominal discomfort, blood in stools or black stools.  No recent change in bowel habits.   GU:  No history of incontinence.   MUSCLOSKELETAL: No history of joint pain or swelling.  No myalgias.   SKIN: Negative for lesions, rash, and itching.   ENDOCRINE: Negative for cold or heat intolerance, polydipsia or goiter.   PSYCH:  No depression or anxiety symptoms.   NEURO: As Above.  Observations/Objective:   Patient is awake, alert, and appears comfortable.   Extraocular muscles are intact. No ptosis.  Face is symmetric.  Speech is not dysarthric.  Antigravity in all extremities.  No pronator drift. Gait appears normal.  He is able to stand up from chair without using arms to push off.   DATA: Labs 11/28/2018:  Vitamin B1 8, SPEP with IFE no M protein, copper 125, folate 12.2 Labs 11/15/2018:  HbA1c 4.9 Lab Results  Component Value Date   CHOL 253 (H) 11/05/2018   HDL 45.00 11/05/2018   LDLDIRECT 160.0 11/05/2018   TRIG 239.0 (H) 11/05/2018   CHOLHDL 6 11/05/2018     Assessment and Plan:  1.  Embolic stroke of uncertain source involving left MCA region (February 2020) Clinically, he has no residual deficits and is doing well.  Given negative stroke work-up for embolic stroke, need to evaluate for underlying cardiac arrhythmia.  There is note in his Epic past medical history of paroxysmal atrial fibrillation.  He was encouraged to follow-up with Dr. Einar Gip to get loop recorder to further evaluate for this. - Continue plavix 75mg  + aspirin 81mg  for one more month, then monotherapy with aspirin 81mg  daily (previously not on antiplatelet therapy) - Continue atorvastatin 40mg  daily for hyperlipidemia (LDL 160) - BP remains well-controlled off medication - Encouraged him to stay physically active - He does not smoke and no history of diabetes  2.  Bilateral feet paresthesias, concerning for early neuropathy. Risk factors include sarcoidosis, mild alcohol use - Neuropathy labs are  reviewed and normal (see above) - EMG if symptoms get worse  3.  Hyperlipidemia (LDL 160) - Continue atorvastatin 40mg  - Check fasting lipid panel   Follow Up Instructions:   I discussed the assessment and treatment plan with the patient. The patient was provided an opportunity to ask questions and all were answered. The patient agreed with the plan and demonstrated an understanding of the instructions.   The patient was advised to call back or seek an in-person evaluation if the symptoms worsen or if the condition fails to improve as anticipated.  Follow-up in 6 months    [name], DO

## 2019-01-27 NOTE — Progress Notes (Signed)
Lab order placed.

## 2019-01-29 ENCOUNTER — Other Ambulatory Visit: Payer: Self-pay

## 2019-01-29 DIAGNOSIS — Z0000 Encounter for general adult medical examination without abnormal findings: Secondary | ICD-10-CM

## 2019-01-29 MED ORDER — ALBUTEROL SULFATE HFA 108 (90 BASE) MCG/ACT IN AERS: INHALATION_SPRAY | RESPIRATORY_TRACT | 4 refills | Status: AC

## 2019-02-05 ENCOUNTER — Ambulatory Visit: Payer: BLUE CROSS/BLUE SHIELD | Admitting: Neurology

## 2019-02-09 ENCOUNTER — Ambulatory Visit (INDEPENDENT_AMBULATORY_CARE_PROVIDER_SITE_OTHER): Payer: BLUE CROSS/BLUE SHIELD | Admitting: Family Medicine

## 2019-02-09 ENCOUNTER — Encounter: Payer: Self-pay | Admitting: Family Medicine

## 2019-02-09 ENCOUNTER — Telehealth: Payer: Self-pay | Admitting: Family Medicine

## 2019-02-09 DIAGNOSIS — N529 Male erectile dysfunction, unspecified: Secondary | ICD-10-CM

## 2019-02-09 MED ORDER — SILDENAFIL CITRATE 100 MG PO TABS: 50.0000 mg | ORAL_TABLET | Freq: Every day | ORAL | 11 refills | Status: DC | PRN

## 2019-02-09 NOTE — Progress Notes (Signed)
    Chief Complaint:  Jason Weaver is a 63 y.o. male who presents today for a virtual office visit with a chief complaint of erectile dysfunction.   Assessment/Plan:  Erectile dysfunction Chronic problem.  Worsening.  Restart Viagra 50 - 100 mg daily as needed.  Discussed potential side effects.     Subjective:  HPI:  # Erectile Dysfunction Has been on Stendra in the past but is not currently on any medications.  Request new medication today.  No reported polyuria.  No reported hematuria.  No penile discharge.  ROS: Per HPI  PMH: He reports that he has never smoked. He has never used smokeless tobacco. He reports current alcohol use of about 2.0 standard drinks of alcohol per week. He reports that he does not use drugs.      Objective/Observations  Physical Exam: Gen: NAD, resting comfortably Pulm: Normal work of breathing Neuro: Grossly normal, moves all extremities Psych: Normal affect and thought content  Virtual Visit via Video   I connected with Jason Weaver on 02/09/19 at  4:20 PM EDT by a video enabled telemedicine application and verified that I am speaking with the correct person using two identifiers. I discussed the limitations of evaluation and management by telemedicine and the availability of in person appointments. The patient expressed understanding and agreed to proceed.   Patient location: Home Provider location: [place] participating in the virtual visit: Myself and Patient     Jason Weaver. Jerline Pain, MD 02/09/2019 4:30 PM

## 2019-02-09 NOTE — Telephone Encounter (Signed)
Patient does not have history of Cialis in chart- he does have history of Viagra in chart- sent to office for PCP review of request.

## 2019-02-09 NOTE — Telephone Encounter (Signed)
Please schedule patient for virtual visit to discuss Rx for new medication.  (Dr. Jerline Pain has not prescribed this medication before).  Thanks!

## 2019-02-09 NOTE — Telephone Encounter (Signed)
Patient is requesting a refill on Cialis.  Please send refill to Jason Weaver on [address]

## 2019-02-09 NOTE — Assessment & Plan Note (Signed)
Chronic problem.  Worsening.  Restart Viagra 50 - 100 mg daily as needed.  Discussed potential side effects.

## 2019-02-09 NOTE — Telephone Encounter (Signed)
See note

## 2019-02-25 DIAGNOSIS — G4733 Obstructive sleep apnea (adult) (pediatric): Secondary | ICD-10-CM | POA: Diagnosis not present

## 2019-02-27 ENCOUNTER — Ambulatory Visit: Payer: BLUE CROSS/BLUE SHIELD | Admitting: Neurology

## 2019-03-10 DIAGNOSIS — G4733 Obstructive sleep apnea (adult) (pediatric): Secondary | ICD-10-CM | POA: Diagnosis not present

## 2019-03-10 DIAGNOSIS — J343 Hypertrophy of nasal turbinates: Secondary | ICD-10-CM | POA: Diagnosis not present

## 2019-03-10 DIAGNOSIS — J342 Deviated nasal septum: Secondary | ICD-10-CM | POA: Diagnosis not present

## 2019-03-10 DIAGNOSIS — J3489 Other specified disorders of nose and nasal sinuses: Secondary | ICD-10-CM | POA: Diagnosis not present

## 2019-03-28 DIAGNOSIS — G4733 Obstructive sleep apnea (adult) (pediatric): Secondary | ICD-10-CM | POA: Diagnosis not present

## 2019-03-30 ENCOUNTER — Encounter: Payer: Self-pay | Admitting: Neurology

## 2019-03-31 DIAGNOSIS — H43391 Other vitreous opacities, right eye: Secondary | ICD-10-CM | POA: Diagnosis not present

## 2019-04-02 ENCOUNTER — Encounter: Payer: Self-pay | Admitting: Neurology

## 2019-04-02 ENCOUNTER — Ambulatory Visit: Payer: BLUE CROSS/BLUE SHIELD | Admitting: Neurology

## 2019-04-02 ENCOUNTER — Other Ambulatory Visit: Payer: Self-pay

## 2019-04-02 ENCOUNTER — Ambulatory Visit (INDEPENDENT_AMBULATORY_CARE_PROVIDER_SITE_OTHER): Payer: BC Managed Care – PPO | Admitting: Neurology

## 2019-04-02 VITALS — BP 127/84 | HR 66 | Temp 98.6°F | Ht 72.0 in | Wt 237.0 lb

## 2019-04-02 DIAGNOSIS — G4733 Obstructive sleep apnea (adult) (pediatric): Secondary | ICD-10-CM | POA: Diagnosis not present

## 2019-04-02 DIAGNOSIS — Z9989 Dependence on other enabling machines and devices: Secondary | ICD-10-CM | POA: Diagnosis not present

## 2019-04-02 NOTE — Patient Instructions (Signed)

## 2019-04-02 NOTE — Progress Notes (Signed)
SLEEP MEDICINE CLINIC   Provider:  Larey Seat, MD   Primary Care Physician:  Vivi Barrack, MD   Referring Provider: Adrian Prows, MD / stroke service   Chief Complaint  Patient presents with  . Follow-up    pt alone, rm 10. [facility] here for inital CPAP follow up.    HPI:  Jason Weaver is a 63 y.o. male patient, seen on 12-11-2018 in a referral from Dr. Einar Gip.      Interval history _ patient has tested positive for OSA and started on auto CPAP.  His sleep study revealed REM and supine dependent OSA , baseline AHi was 19/h.  He likes nasal cradle in large.  He reports not feeling a  difference  In how he feels and had compliance gaps due to allergies. Nasal airway congestion. In spite of care through ENT , Dr Wilburn Cornelia.- Dr Ricard Dillon at [facility] for a surgery- nasal septal deviation rel=pair.  Having started on albuterol, Flonase NS, and not using anti allergy medication.    He is now finding his way back to compliance.  He under stands the need for atrial fib and embolism control, lowering the risik of strokes.      RESPIRATORY ANALYSIS: There were a total of 121 respiratory  events: 0 obstructive apneas, 0 central apneas and 0 mixed  apneas with a total of 0 apneas and an apnea index (AI) of 0  /hour. There were 121 hypopneas with a hypopnea index of 19.3  /hour.   The total APNEA/HYPOPNEA INDEX (AHI) was 19.3 /hour. 69 events  occurred in REM sleep and 104 events in NREM. The REM AHI was 0.  39.6 /hour, versus a non-REM AHI of 11.4. The patient spent 222.5  minutes of total sleep time in the supine position and 155  minutes in non-supine.  The supine AHI was 23.2 versus a non-supine AHI of 13.6.   OXYGEN SATURATION & C02: The [place] baseline 02 saturation was  92%, with the lowest being 85%. Time spent below 89% saturation  equaled 30 minutes.    PERIODIC LIMB MOVEMENTS: The arousals were noted as: 23 were  spontaneous, 4 were associated with  PLMs, and 92 were associated  with respiratory events.  The patient had a total of 197 Periodic Limb Movements. The  Periodic Limb Movement (PLM) index was 31.4 and the PLM Arousal  index was .6/hour.    Audio and video analysis did not show any abnormal or unusual  movements, behaviors, phonations or vocalizations.   Snoring was noted. EKG was in keeping with normal sinus rhythm  (NSR).  Post-study, the patient indicated that sleep was the same as  usual.    IMPRESSION: Moderate Obstructive Sleep Apnea (OSA) confirmed.   RECOMMENDATIONS:   Auto titration device 5-18 cm water with 3 cm EPR.   I certify that I have reviewed the entire raw data recording  prior to the issuance of this report in accordance with the  Standards of Accreditation of the [place] [employer] Sleep  Medicine (AASM)     Larey Seat, MD  01-09-2019  Diplomat, American Board of Psychiatry and Neurology  Diplomat, American Board of [place] Director, [facility] [place]: 01/08/19 20:00          I have the pleasure of meeting Quintarius as presented today on 11 December 2018, I had seen him probably around 11 years  ago for acute hearing loss in the left ear that he suffered during a skiing trip.  His hearing has never returned.  He is seen here today because he presented to the hospital on 09 November 2018 with heaviness in his right arm and loss of dexterity.  He also had facial numbness and tingling.  He went by private car to the emergency room where he waited 6 hours and left.  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  He was evaluated by lower extremity duplex which was negative had a normal echo and TEE, a source for an embolic CVA was not determined.  He was discharged on 11 November 2018 with his dual antiplatelet therapy and Lipitor.  The arm clumsiness has meanwhile resolved  and the search for other possible risk factors it also came up that the patient had been tested for sleep apnea more than 10 years ago and that his wife has concerned about him snoring very loudly and potentially having apneic episodes.  The patient is history status post tonsillectomy, carries a diagnosis of paroxysmal atrial fibrillation, pulmonary sarcoidosis by biopsy, nasal septal deviation, hyperlipidemia, left hearing loss sensorineural, gout, and a nonischemic cardiomyopathy was added.  We are meeting today to reinvestigate if his apnea is well treated, and if he needs any additional possible treatment.  I also reviewed his medication list which is stated below.  The MRI from 14 November 2018 showed small early subacute infarction within the left superior precentral gyrus and additional punctate lesions in the left anterior superior frontal cortex there was no mass-effect or edema no hemorrhage noted there is a mild progression of microvascular changes since 2009 when I had ordered his last MRI following his hearing loss.  The patient underwent an nocturnal polysomnogram study on 14 May 2011 with Dr. Elsworth Soho at the [facility] sleep lab, he did not have significant arousals, his AHI at baseline was determined by an apnea link study at 16/h there were no central apneas noted and no significant desaturations.  He was titrated to 11 cmH2O using a full facemask.  Hypopneas resolved slower than the apneas, he was also forced to sleep supine but it is his sleep habits to sleep on the side and his current fullface facemask does interfere with this.  The patient is using a CPAP by [employer], this is a C-Flex machine- his compliance was 56.7% also it includes 3 days in the hospital during which she could not have used his machine.  The average usage is 4 hours 57 minutes at night, the residual AHI is very low 0.3 apneas per hour, the machine is set to 11 cmH2O with a C-Flex function.  The ramp starts at  a pressure of 5 cmH2O over 15 minutes achieving the maximum pressure.  Chief complaint according to patient : CVA risk factor management.   Sleep habits are as follows:  6.30 to 7.30 is dinner, he watches TV or reads. He goes to bed between 9 and 10 Pm, and wakes up at 2 AM- unknown trigger- He still snores. No palpitations, no diaphoresis, no chest pain. He wakes and  and his brain starts racing- trouble to go back to sleep. Uses Ambien and melatonin. Has reported a muscle tension over the neck. He rises between 5-6 Am and goes to the gym.  He reports dry mouth. No headaches,  No tinnitus.    Sleep medical history:  Septal deviation to the left-   No Family history of sleep apnea. MGF had a stroke.     Social history: married, still working, non smoker, red wine drinker- with dinner 2 glasses. Caffeine use; one cup in AM- 1 soda everey other week, no tea.     Review of Systems: Out of a complete 14 system review, the patient complains of only the following symptoms, and all other reviewed systems are negative.  Dry mouth, stroke symptoms.  Epworth Sleep score :  5/ 24  Fatigue severity score:  15/63  Depression score;  Geriatric 0/15   Social History   Socioeconomic History  . Marital status: Married    Spouse name: Not on file  . Number of children: 0  . Years of education: 16  . Highest education level: Bachelor's degree (e.g., BA, AB, BS)  Occupational History  . Occupation: Herbalist: [employer]  . Financial resource strain: Not on file  . Food insecurity    Worry: Not on file    Inability: Not on file  . Transportation needs    Medical: Not on file    Non-medical: Not on file  Tobacco Use  . Smoking status: Never Smoker  . Smokeless tobacco: Never Used  Substance and Sexual Activity  . Alcohol use: Yes    Alcohol/week: 2.0 standard drinks    Types: 2 Glasses of wine per week  . Drug use: Never  . Sexual activity: Yes  Lifestyle   . Physical activity    Days per week: Not on file    Minutes per session: Not on file  . Stress: Not on file  Relationships  . Social Herbalist on phone: Not on file    Gets together: Not on file    Attends religious service: Not on file    Active member of club or organization: Not on file    Attends meetings of clubs or organizations: Not on file    Relationship status: Not on file  . Intimate partner violence    Fear of current or ex partner: Not on file    Emotionally abused: Not on file    Physically abused: Not on file    Forced sexual activity: Not on file  Other Topics Concern  . Not on file  Social History Narrative   Lives with wife in a one story home.  No children.  Works for [employer].  Education: college.     Family History  Problem Relation Age of Onset  . COPD Father        was a smoker  . Arthritis Father   . Asthma Sister   . Cancer Sister   . Alcohol abuse Brother     Past Medical History:  Diagnosis Date  . Cardiomyopathy, nonischemic ([place])   . Diverticulosis of colon   . ED (erectile dysfunction)   . Gout   . Hearing loss   . Hx of colonic polyps   . Hypercholesteremia 12/05/2018  . Hyperlipidemia   . Nasal septal deviation   . Obesity   . OSA (obstructive sleep apnea)   . PAF (paroxysmal atrial fibrillation) ([place])   . 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 CATARACT EXTRACTION    . HERNIA REPAIR  2009  . LUNG BIOPSY  1997  . TEE WITHOUT CARDIOVERSION N/A 11/25/2018  Procedure: TRANSESOPHAGEAL ECHOCARDIOGRAM (TEE);  Surgeon: Nigel Mormon, MD;  Location: [facility] ENDOSCOPY;  Service: Cardiovascular;  Laterality: N/A;  . TONSILLECTOMY  1979    Current Outpatient Medications  Medication Sig Dispense Refill  . albuterol (PROAIR HFA) 108 (90 Base) MCG/ACT inhaler INHALE 2 PUFFS INTO THE LUNGS EVERY 6 (SIX) HOURS AS NEEDED FOR WHEEZING. 8.5 each 4  . Ascorbic Acid  (VITAMIN C) 1000 MG tablet Take 1,000 mg by mouth daily.    Marland Kitchen aspirin EC 81 MG EC tablet Take 1 tablet (81 mg total) by mouth daily. 30 tablet 0  . atorvastatin (LIPITOR) 40 MG tablet Take 1 tablet (40 mg total) by mouth daily at 6 PM. 90 tablet 0  . Cyanocobalamin (VITAMIN B 12 PO) Take 1 tablet by mouth daily.    . diphenhydrAMINE (BENADRYL) 25 MG tablet Take 25 mg by mouth every 6 (six) hours as needed for allergies.    . hydrocortisone (ANUSOL-HC) 2.5 % rectal cream Place 1 application rectally 2 (two) times daily as needed for hemorrhoids (itching).    . metroNIDAZOLE (FLAGYL) 500 MG tablet Take 1 tablet (500 mg total) by mouth 3 (three) times daily as needed (flareup).    . Pyridoxine HCl (VITAMIN B-6 PO) Take 1 tablet by mouth daily.    . sildenafil (VIAGRA) 100 MG tablet Take 0.5-1 tablets (50-100 mg total) by mouth daily as needed for erectile dysfunction. 5 tablet 11  . valACYclovir (VALTREX) 1000 MG tablet Take 1 g by mouth as directed. Take 1 tablet daily for 3-4 days as needed for flare ups  2  . vitamin E 100 UNIT capsule Take 100 Units by mouth daily.     No current facility-administered medications for this visit.     Allergies as of 04/02/2019 - Review Complete 04/02/2019  Allergen Reaction Noted  . Ciprofloxacin Rash 02/16/2016  . Statins  11/15/2018    Vitals: BP 127/84   Pulse 66   Temp 98.6 F (37 C)   Ht 6' (1.829 m)   Wt 237 lb (107.5 kg)   BMI 32.14 kg/m  Last Weight:  Wt Readings from Last 1 Encounters:  04/02/19 237 lb (107.5 kg)   TWS:FKCL mass index is 32.14 kg/m.     Last Height:   Ht Readings from Last 1 Encounters:  04/02/19 6' (1.829 m)    Physical exam:  General: The patient is awake, alert and appears not in acute distress. The patient is well groomed. Head: Normocephalic, atraumatic. Neck is supple. Mallampati ,  neck circumference:17" . Nasal airflow congested , right nasion is blocked.  TMJ click is not evident . Retrognathia is not  seen. (Wore braces).  Cardiovascular:  Regular rate and rhythm , without  murmurs or carotid bruit, and without distended neck veins. Respiratory: Lungs are clear to auscultation. Skin:  Without evidence of edema, or rash Trunk: BMI is 31/ kg/m2. The patient's posture is erect.  Neurologic exam :The patient is awake and alert, oriented to place and time.   Memory subjective described as intact. Attention span & concentration ability appears normal. Speech is fluent, without dysarthria, dysphonia or aphasia. Mood and affect are appropriate.  Cranial nerves: Pupils are equal and briskly reactive to light. Funduscopic exam without evidence of pallor or edema. Extraocular movements  in vertical and horizontal planes intact and without nystagmus. Visual fields by finger perimetry are intact. Hearing loss in the left - with a loud tinnitus, has also tinnitus on the right.ear.   Facial  sensation intact to fine touch.  Facial motor strength is symmetric and tongue and uvula move midline. Shoulder shrug was symmetrical.   Motor exam:  Normal tone, muscle bulk and symmetric strength in all extremities.  Sensory:  Fine touch, pinprick and vibration were tested in all extremities. Proprioception tested in the upper extremities was normal.  Coordination:  Finger-to-nose maneuver  normal without evidence of ataxia, dysmetria or tremor.  Gait and station: Patient walks without assistive device and is able unassisted to climb up to the exam table. Strength within normal limits.  Stance is stable and normal. His vestibular sytem is impaired.   Toe  stand was tested .Marland Kitchen Deep tendon reflexes: in the upper and lower extremities are symmetric and intact.     Assessment:  After physical and neurologic examination, review of laboratory studies,  Personal review of imaging studies, reports of other /same  Imaging studies, results of polysomnography and / or neurophysiology testing and pre-existing records as far  as provided in visit., my assessment is   1)has  OSA-  With REN and supine accentuation.  folow up after nasal surgery- Referral to ENT for septal deviation and correction. Sinuitis. He will e on pause for CPAP use for 6 weeks after that.   RV in 6 month.    Larey Seat, MD 1/61/0960, 45:40 AM  Certified in Neurology by ABPN Certified in [place] by [facility] [address], [place], [place] [address]

## 2019-04-07 DIAGNOSIS — H43811 Vitreous degeneration, right eye: Secondary | ICD-10-CM | POA: Diagnosis not present

## 2019-04-07 DIAGNOSIS — Z961 Presence of intraocular lens: Secondary | ICD-10-CM | POA: Diagnosis not present

## 2019-04-07 DIAGNOSIS — H2512 Age-related nuclear cataract, left eye: Secondary | ICD-10-CM | POA: Diagnosis not present

## 2019-04-08 ENCOUNTER — Ambulatory Visit: Payer: Self-pay | Admitting: Neurology

## 2019-04-27 DIAGNOSIS — G4733 Obstructive sleep apnea (adult) (pediatric): Secondary | ICD-10-CM | POA: Diagnosis not present

## 2019-05-13 ENCOUNTER — Ambulatory Visit: Payer: BLUE CROSS/BLUE SHIELD | Admitting: Neurology

## 2019-05-19 DIAGNOSIS — J342 Deviated nasal septum: Secondary | ICD-10-CM | POA: Diagnosis not present

## 2019-05-19 DIAGNOSIS — M950 Acquired deformity of nose: Secondary | ICD-10-CM | POA: Diagnosis not present

## 2019-05-19 DIAGNOSIS — J3489 Other specified disorders of nose and nasal sinuses: Secondary | ICD-10-CM | POA: Diagnosis not present

## 2019-05-19 DIAGNOSIS — J343 Hypertrophy of nasal turbinates: Secondary | ICD-10-CM | POA: Diagnosis not present

## 2019-06-01 ENCOUNTER — Encounter: Payer: Self-pay | Admitting: Cardiology

## 2019-06-12 ENCOUNTER — Encounter: Payer: Self-pay | Admitting: Cardiology

## 2019-06-12 ENCOUNTER — Other Ambulatory Visit: Payer: Self-pay

## 2019-06-12 ENCOUNTER — Ambulatory Visit: Payer: BLUE CROSS/BLUE SHIELD | Admitting: Cardiology

## 2019-06-12 VITALS — BP 122/80 | HR 76 | Temp 97.1°F | Ht 72.0 in | Wt 228.1 lb

## 2019-06-12 DIAGNOSIS — E7800 Pure hypercholesterolemia, unspecified: Secondary | ICD-10-CM | POA: Diagnosis not present

## 2019-06-12 DIAGNOSIS — I63412 Cerebral infarction due to embolism of left middle cerebral artery: Secondary | ICD-10-CM

## 2019-06-12 NOTE — Progress Notes (Signed)
Subjective:  Primary Physician:  Vivi Barrack, MD  Patient ID: Jason Weaver, male    DOB: [birth year], 63 y.o.   MRN: [id number]  Chief Complaint  Patient presents with  . Cerebrovascular Accident    HPI: Jason LEDWELL  is a 63 y.o. male  with a cerebrovascular accident, hyperlipidemia, and pulmonary sarcoidosis, mild obesity, presents for f/u of CVA.   [name] is a Caucasian male who woke up with symptoms on 11/09/18 with heaviness in his right arm and loss of dexterity.  Outpatient MRI which confirmed left MCA scattered infarcts in a embolic pattern. Had negative lower extremity duplex, normal echocardiogram, source for embolic CVA not determined.   He now presents for a six-month office visit, states that he is doing well and still continues to have tingling sensation in his fingers and also on his scalp, states that he has been using CPAP regularly, has been referred for [facility] at [facility] for deviated septum repair.  Feels well and remains asymptomatic otherwise.  Has discontinued aspirin and also atorvastatin. Past Medical History:  Diagnosis Date  . Cardiomyopathy, nonischemic ([place])   . Diverticulosis of colon   . ED (erectile dysfunction)   . Gout   . Hearing loss   . Hx of colonic polyps   . Hypercholesteremia 12/05/2018  . Hyperlipidemia   . Nasal septal deviation   . Obesity   . OSA (obstructive sleep apnea)   . PAF (paroxysmal atrial fibrillation) ([place])   . Pulmonary sarcoidosis ([place])  1997    biopsy proven    Past Surgical History:  Procedure Laterality Date  . CATARACT EXTRACTION    . HERNIA REPAIR  2009  . LUNG BIOPSY  1997  . TEE WITHOUT CARDIOVERSION N/A 11/25/2018   Procedure: TRANSESOPHAGEAL ECHOCARDIOGRAM (TEE);  Surgeon: Nigel Mormon, MD;  Location: [facility] ENDOSCOPY;  Service: Cardiovascular;  Laterality: N/A;  . TONSILLECTOMY  1979    Social History   Socioeconomic History  . Marital status: Married    Spouse name: Not on file   . Number of children: 0  . Years of education: 16  . Highest education level: Bachelor's degree (e.g., BA, AB, BS)  Occupational History  . 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 Financial resource strain: Not on file  . Food insecurity    Worry: Not on file    Inability: Not on file  . Transportation needs    Medical: Not on file    Non-medical: Not on file  Tobacco Use  . Smoking status: Passive Smoke Exposure - Never Smoker  . Smokeless tobacco: Never Used  Substance and Sexual Activity  . Alcohol use: Yes    Alcohol/week: 2.0 standard drinks    Types: 2 Glasses of wine per week  . Drug use: Never  . Sexual activity: Yes  Lifestyle  . Physical activity    Days per week: Not on file    Minutes per session: Not on file  . Stress: Not on file  Relationships  . Social Herbalist on phone: Not on file    Gets together: Not on file    Attends religious service: Not on file    Active member of club or organization: Not on file    Attends meetings of clubs or organizations: Not on file    Relationship status: Not on file  . Intimate partner violence    Fear of  current or ex partner: Not on file    Emotionally abused: Not on file    Physically abused: Not on file    Forced sexual activity: Not on file  Other Topics Concern  . Not on file  Social History Narrative   Lives with wife in a one story home.  No children.  Works for [employer].  Education: college.     Current Outpatient Medications on File Prior to Visit  Medication Sig Dispense Refill  . albuterol (PROAIR HFA) 108 (90 Base) MCG/ACT inhaler INHALE 2 PUFFS INTO THE LUNGS EVERY 6 (SIX) HOURS AS NEEDED FOR WHEEZING. 8.5 each 4  . Ascorbic Acid (VITAMIN C) 1000 MG tablet Take 1,000 mg by mouth daily.    . Cyanocobalamin (VITAMIN B 12 PO) Take 1 tablet by mouth daily.    . diphenhydrAMINE (BENADRYL) 25 MG tablet Take 25 mg by mouth every 6 (six) hours as needed for allergies.    .  fluticasone (FLONASE) 50 MCG/ACT nasal spray Place 1 spray into both nostrils as needed.    . hydrocortisone (ANUSOL-HC) 2.5 % rectal cream Place 1 application rectally 2 (two) times daily as needed for hemorrhoids (itching).    Marland Kitchen ibuprofen (ADVIL) 200 MG tablet Take 200 mg by mouth as needed.    . metroNIDAZOLE (FLAGYL) 500 MG tablet Take 1 tablet (500 mg total) by mouth 3 (three) times daily as needed (flareup).    . Pyridoxine HCl (VITAMIN B-6 PO) Take 1 tablet by mouth daily.    . sildenafil (VIAGRA) 100 MG tablet Take 0.5-1 tablets (50-100 mg total) by mouth daily as needed for erectile dysfunction. 5 tablet 11  . valACYclovir (VALTREX) 1000 MG tablet Take 1 g by mouth as directed. Take 1 tablet daily for 3-4 days as needed for flare ups  2  . vitamin E 100 UNIT capsule Take 100 Units by mouth daily.     No current facility-administered medications on file prior to visit.    Review of Systems  Constitutional: Negative for malaise/fatigue and weight loss.  Respiratory: Negative for cough, hemoptysis and shortness of breath.   Cardiovascular: Negative for chest pain, palpitations, claudication and leg swelling.  Gastrointestinal: Negative for abdominal pain, blood in stool, constipation, heartburn and vomiting.  Genitourinary: Negative for dysuria.  Musculoskeletal: Positive for neck pain (since stroke has tingling and numbness both arm in morning). Negative for joint pain and myalgias.  Neurological: Negative for dizziness, focal weakness and headaches.  Endo/Heme/Allergies: Does not bruise/bleed easily.  Psychiatric/Behavioral: Negative for depression. The patient is not nervous/anxious.   All other systems reviewed and are negative.  Objective:  Blood pressure 122/80, pulse 76, temperature (!) 97.1 F (36.2 C), height 6' (1.829 m), weight 228 lb 1.6 oz (103.5 kg), SpO2 95 %. Body mass index is 30.94 kg/m.  Physical Exam  Constitutional: He appears well-developed and well-nourished.  No distress.  HENT:  Head: Atraumatic.  Eyes: Conjunctivae are normal.  Neck: Neck supple. No JVD present. No thyromegaly present.  Cardiovascular: Normal rate, regular rhythm, normal heart sounds and intact distal pulses. Exam reveals no gallop.  No murmur heard. Pulmonary/Chest: Effort normal and breath sounds normal.  Abdominal: Soft. Bowel sounds are normal.  Musculoskeletal: Normal range of motion.        General: No edema.  Neurological: He is alert.  Skin: Skin is warm and dry.  Psychiatric: He has a normal mood and affect.   CARDIAC STUDIES:   MRI brain 11/14/2018: 1. Small early subacute  infarction within the left superior precentral gyrus and additional punctate focus in the left anterosuperior frontal cortex. No associated hemorrhage or mass effect. 2. Mild chronic microvascular ischemic changes and volume loss of the brain. Mild progression from 2009.  CTA head and neck 11/15/2018: Mild atherosclerosis without flow limiting stenosis or evident embolic source in the head and neck.  Echocardiogram 11/15/2018: Normal LV size, normal LV systolic function, EF 0000000. No other significant abnormality noted.  TEE on [id number]:  No cardioembolic source for CVA identified.   Assessment & Recommendations:      ICD-10-CM   1. Hypercholesteremia  E78.00   2. Cerebrovascular accident (CVA) due to embolism of left middle cerebral artery ([place])  I63.412 EKG 12-Lead   EKG 06/12/2019: Normal sinus rhythm at rate of 79 bpm, borderline criteria for left atrial enlargement, otherwise normal EKG.  Recommendation: Mr Sarvin Krupa is a Caucasian male who woke up with symptoms on 11/09/18 with heaviness in his right arm and loss of dexterity. On MRI on 11/12/2018, he was found to have left MCA scattered embolic pattern infarct. His A. Fib risks include OSA and now on CPAP, overweight.  He has mixed hyperlipidemia. No hypertension or DM or F/H of vascular disease. He did not want the loop  recorder, and he has discontinued ASA and lipitor as well.   I have discussed with him regarding secondary prevention including weight loss, compliance with CPAP.  As he remains stable from cardiac standpoint, I'll see him back on a p.r.n. basis.  Adrian Prows, MD, [facility] 06/12/2019, 3:49 PM [facility] [place] Pager: [phone] Office: [phone] If no answer Cell [phone]

## 2019-06-25 DIAGNOSIS — M545 Low back pain: Secondary | ICD-10-CM | POA: Diagnosis not present

## 2019-07-31 ENCOUNTER — Ambulatory Visit: Payer: BLUE CROSS/BLUE SHIELD | Admitting: Neurology

## 2019-08-24 ENCOUNTER — Other Ambulatory Visit: Payer: Self-pay | Admitting: Family Medicine

## 2019-09-01 ENCOUNTER — Other Ambulatory Visit: Payer: Self-pay

## 2019-09-01 DIAGNOSIS — Z20822 Contact with and (suspected) exposure to covid-19: Secondary | ICD-10-CM

## 2019-09-02 DIAGNOSIS — M1A09X0 Idiopathic chronic gout, multiple sites, without tophus (tophi): Secondary | ICD-10-CM | POA: Diagnosis not present

## 2019-09-03 LAB — NOVEL CORONAVIRUS, NAA: SARS-CoV-2, NAA: NOT DETECTED

## 2019-09-07 DIAGNOSIS — L821 Other seborrheic keratosis: Secondary | ICD-10-CM | POA: Diagnosis not present

## 2019-09-07 DIAGNOSIS — L218 Other seborrheic dermatitis: Secondary | ICD-10-CM | POA: Diagnosis not present

## 2019-09-07 DIAGNOSIS — L820 Inflamed seborrheic keratosis: Secondary | ICD-10-CM | POA: Diagnosis not present

## 2019-09-07 DIAGNOSIS — L738 Other specified follicular disorders: Secondary | ICD-10-CM | POA: Diagnosis not present

## 2019-09-07 DIAGNOSIS — D485 Neoplasm of uncertain behavior of skin: Secondary | ICD-10-CM | POA: Diagnosis not present

## 2019-09-08 DIAGNOSIS — J3489 Other specified disorders of nose and nasal sinuses: Secondary | ICD-10-CM | POA: Diagnosis not present

## 2019-09-08 DIAGNOSIS — J342 Deviated nasal septum: Secondary | ICD-10-CM | POA: Diagnosis not present

## 2019-09-08 DIAGNOSIS — M950 Acquired deformity of nose: Secondary | ICD-10-CM | POA: Diagnosis not present

## 2019-09-08 DIAGNOSIS — J343 Hypertrophy of nasal turbinates: Secondary | ICD-10-CM | POA: Diagnosis not present

## 2019-09-21 ENCOUNTER — Other Ambulatory Visit: Payer: Self-pay

## 2019-09-21 DIAGNOSIS — Z20822 Contact with and (suspected) exposure to covid-19: Secondary | ICD-10-CM

## 2019-09-22 LAB — NOVEL CORONAVIRUS, NAA: SARS-CoV-2, NAA: NOT DETECTED

## 2019-09-25 DIAGNOSIS — D869 Sarcoidosis, unspecified: Secondary | ICD-10-CM | POA: Diagnosis not present

## 2019-09-25 DIAGNOSIS — Z7902 Long term (current) use of antithrombotics/antiplatelets: Secondary | ICD-10-CM | POA: Diagnosis not present

## 2019-09-25 DIAGNOSIS — M950 Acquired deformity of nose: Secondary | ICD-10-CM | POA: Diagnosis not present

## 2019-09-25 DIAGNOSIS — J3489 Other specified disorders of nose and nasal sinuses: Secondary | ICD-10-CM | POA: Diagnosis not present

## 2019-09-25 DIAGNOSIS — J343 Hypertrophy of nasal turbinates: Secondary | ICD-10-CM | POA: Diagnosis not present

## 2019-09-25 DIAGNOSIS — Z79899 Other long term (current) drug therapy: Secondary | ICD-10-CM | POA: Diagnosis not present

## 2019-09-25 DIAGNOSIS — M109 Gout, unspecified: Secondary | ICD-10-CM | POA: Diagnosis not present

## 2019-09-25 DIAGNOSIS — N529 Male erectile dysfunction, unspecified: Secondary | ICD-10-CM | POA: Diagnosis not present

## 2019-09-25 DIAGNOSIS — Z8673 Personal history of transient ischemic attack (TIA), and cerebral infarction without residual deficits: Secondary | ICD-10-CM | POA: Diagnosis not present

## 2019-09-25 DIAGNOSIS — G4733 Obstructive sleep apnea (adult) (pediatric): Secondary | ICD-10-CM | POA: Diagnosis not present

## 2019-09-25 DIAGNOSIS — Z9989 Dependence on other enabling machines and devices: Secondary | ICD-10-CM | POA: Diagnosis not present

## 2019-09-25 DIAGNOSIS — E785 Hyperlipidemia, unspecified: Secondary | ICD-10-CM | POA: Diagnosis not present

## 2019-09-25 DIAGNOSIS — J342 Deviated nasal septum: Secondary | ICD-10-CM | POA: Diagnosis not present

## 2019-10-06 ENCOUNTER — Other Ambulatory Visit: Payer: Self-pay

## 2019-10-06 ENCOUNTER — Encounter: Payer: Self-pay | Admitting: Neurology

## 2019-10-06 ENCOUNTER — Ambulatory Visit: Payer: BC Managed Care – PPO | Admitting: Neurology

## 2019-10-06 VITALS — BP 133/90 | HR 61 | Temp 97.4°F | Ht 72.0 in | Wt 229.0 lb

## 2019-10-06 DIAGNOSIS — J3489 Other specified disorders of nose and nasal sinuses: Secondary | ICD-10-CM

## 2019-10-06 DIAGNOSIS — D860 Sarcoidosis of lung: Secondary | ICD-10-CM | POA: Diagnosis not present

## 2019-10-06 DIAGNOSIS — I63412 Cerebral infarction due to embolism of left middle cerebral artery: Secondary | ICD-10-CM

## 2019-10-06 DIAGNOSIS — G4733 Obstructive sleep apnea (adult) (pediatric): Secondary | ICD-10-CM

## 2019-10-06 NOTE — Progress Notes (Signed)
SLEEP MEDICINE CLINIC   Provider:  Larey Seat, MD   Primary Care Physician:  Vivi Barrack, MD   Referring Provider: Adrian Prows, MD / stroke service   Chief Complaint  Patient presents with  . Follow-up    pt alone, rm 10. pt states no issues or concerns. stopped using his machine over the summer after visit in June.     HPI:  Jason Weaver is a 63 y.o. male patient, following up after nasal surgery. In the meantime Jason Weaver had undergone a procedure following the report of an acute embolic stroke in January 2020.  He has followed up with Dr. Gretta Arab due to cerebrovascular accident embolism to the middle cerebral artery.  He had just within the last 3 weeks nasal septal corrective surgery and his nasal airflow has significantly increased as has his sense of smell. He has not been allowed to use CPAP yet, waiting for clearance from Dr. Kathlyn Sacramento, [facility]. His sleep has increased in quality.  The left nostril is for the h first time patent in many years.  He is now working on core strength and weight loss. His auto CPAP should be reduced in pressure , he may start not earlier than January 6 th.  I will restrict the upper limit of auto- CPAP to 12 cm water.    He was seen last in person on 12-11-2018 in a referral from Dr. Einar Gip.  Interval history _ patient has tested positive for OSA and started on auto CPAP.  His sleep study revealed REM and supine dependent OSA , baseline AHi was 19/h.  He likes nasal cradle in large.  He reports not feeling a  difference  In how he feels and had compliance gaps due to allergies. Nasal airway congestion. In spite of care through ENT , Dr Wilburn Cornelia.- Dr Ricard Dillon at [facility] for a surgery- nasal septal deviation rel=pair.  Having started on albuterol, Flonase NS, and not using anti allergy medication.   He is now finding his way back to compliance.  He under stands the need for atrial fib and embolism control, lowering the risik  of strokes.   OSA-  With REM and supine accentuation of the AHI.  folow up after nasal surgery- Referral to ENT for septal deviation and correction. Sinuitis. He will e on pause for CPAP use for 6 weeks after that.   RV in 6 month.    RESPIRATORY ANALYSIS: There were a total of 121 respiratory  events: 0 obstructive apneas, 0 central apneas and 0 mixed  apneas with a total of 0 apneas and an apnea index (AI) of 0  /hour. There were 121 hypopneas with a hypopnea index of 19.3  /hour.   The total APNEA/HYPOPNEA INDEX (AHI) was 19.3 /hour. 69 events  occurred in REM sleep and 104 events in NREM. The REM AHI was 0.  39.6 /hour, versus a non-REM AHI of 11.4. The patient spent 222.5  minutes of total sleep time in the supine position and 155  minutes in non-supine.  The supine AHI was 23.2 versus a non-supine AHI of 13.6.   OXYGEN SATURATION & C02: The [place] baseline 02 saturation was  92%, with the lowest being 85%. Time spent below 89% saturation  equaled 30 minutes.    PERIODIC LIMB MOVEMENTS: The arousals were noted as: 23 were  spontaneous, 4 were associated with PLMs, and 92 were associated  with respiratory events.  The patient had a  total of 197 Periodic Limb Movements. The  Periodic Limb Movement (PLM) index was 31.4 and the PLM Arousal  index was .6/hour.    Audio and video analysis did not show any abnormal or unusual  movements, behaviors, phonations or vocalizations.   Snoring was noted. EKG was in keeping with normal sinus rhythm  (NSR).  Post-study, the patient indicated that sleep was the same as  usual.    IMPRESSION: Moderate Obstructive Sleep Apnea (OSA) confirmed.   RECOMMENDATIONS:   Auto titration device 5-18 cm water with 3 cm EPR.   I certify that I have reviewed the entire raw data recording  prior to the issuance of this report in accordance with the  Standards of Accreditation of the [place] [employer] of Sleep  Medicine (AASM)      Larey Seat, MD  01-09-2019  Diplomat, American Board of Psychiatry and Neurology  Diplomat, American Board of [place] Director, [place] [facility] [place]: 01/08/19 20:00          I have the pleasure of meeting Jason Weaver as presented today on 11 December 2018, I had seen him probably around 11 years ago for acute hearing loss in the left ear that he suffered during a skiing trip.  His hearing has never returned.  He is seen here today because he presented to the hospital on 09 November 2018 with heaviness in his right arm and loss of dexterity.  He also had facial numbness and tingling.  He went by private car to the emergency room where he waited 6 hours and left.  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  He was evaluated by lower extremity duplex which was negative had a normal echo and TEE, a source for an embolic CVA was not determined.  He was discharged on 11 November 2018 with his dual antiplatelet therapy and Lipitor.  The arm clumsiness has meanwhile resolved and the search for other possible risk factors it also came up that the patient had been tested for sleep apnea more than 10 years ago and that his wife has concerned about him snoring very loudly and potentially having apneic episodes.  The patient is history status post tonsillectomy, carries a diagnosis of paroxysmal atrial fibrillation, pulmonary sarcoidosis by biopsy, nasal septal deviation, hyperlipidemia, left hearing loss sensorineural, gout, and a nonischemic cardiomyopathy was added.  We are meeting today to reinvestigate if his apnea is well treated, and if he needs any additional possible treatment.  I also reviewed his medication list which is stated below.  The MRI from 14 November 2018 showed small early subacute infarction within the left superior precentral gyrus and additional punctate lesions  in the left anterior superior frontal cortex there was no mass-effect or edema no hemorrhage noted there is a mild progression of microvascular changes since 2009 when I had ordered his last MRI following his hearing loss.  The patient underwent an nocturnal polysomnogram study on 14 May 2011 with Dr. Elsworth Soho at the [facility] sleep lab, he did not have significant arousals, his AHI at baseline was determined by an apnea link study at 16/h there were no central apneas noted and no significant desaturations.  He was titrated to 11 cmH2O using a full facemask.  Hypopneas resolved slower than the apneas, he was also forced to sleep supine but it is his sleep habits to  sleep on the side and his current fullface facemask does interfere with this.  The patient is using a CPAP by [employer], this is a C-Flex machine- his compliance was 56.7% also it includes 3 days in the hospital during which she could not have used his machine.  The average usage is 4 hours 57 minutes at night, the residual AHI is very low 0.3 apneas per hour, the machine is set to 11 cmH2O with a C-Flex function.  The ramp starts at a pressure of 5 cmH2O over 15 minutes achieving the maximum pressure.  Chief complaint according to patient : CVA risk factor management.   Sleep habits are as follows:  6.30 to 7.30 is dinner, he watches TV or reads. He goes to bed between 9 and 10 Pm, and wakes up at 2 AM- unknown trigger- He still snores. No palpitations, no diaphoresis, no chest pain. He wakes and  and his brain starts racing- trouble to go back to sleep. Uses Ambien and melatonin. Has reported a muscle tension over the neck. He rises between 5-6 AM and goes to the gym.  He reports dry mouth. No headaches,  No tinnitus.    Sleep medical history:  Septal deviation to the left-   No Family history of sleep apnea. MGF had a stroke.     Social history: married, still working, non smoker, red wine drinker- with dinner 2  glasses. Caffeine use; one cup in AM- 1 soda everey other week, no tea.     Review of Systems: Out of a complete 14 system review, the patient complains of only the following symptoms, and all other reviewed systems are negative.  Dry mouth, stroke symptoms.  [place]worth Sleep score :  5/ 24  Fatigue severity score:  15/63  Depression score;  Geriatric 0/15   Social History   Socioeconomic History  . Marital status: Married    Spouse name: Not on file  . Number of children: 0  . Years of education: 16  . Highest education level: Bachelor's degree (e.g., BA, AB, BS)  Occupational History  . 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 Smoking status: Passive Smoke Exposure - Never Smoker  . Smokeless tobacco: Never Used  Substance and Sexual Activity  . Alcohol use: Yes    Alcohol/week: 2.0 standard drinks    Types: 2 Glasses of wine per week  . Drug use: Never  . Sexual activity: Yes  Other Topics Concern  . Not on file  Social History Narrative   Lives with wife in a one story home.  No children.  Works for [employer].  Education: college.    Social Determinants of Health   Financial Resource Strain:   . Difficulty of Paying Living Expenses: Not on file  Food Insecurity:   . Worried About Charity fundraiser in the Last Year: Not on file  . Ran Out of Food in the Last Year: Not on file  Transportation Needs:   . Lack of Transportation (Medical): Not on file  . Lack of Transportation (Non-Medical): Not on file  Physical Activity:   . Days of Exercise per Week: Not on file  . Minutes of Exercise per Session: Not on file  Stress:   . Feeling of Stress : Not on file  Social Connections:   . Frequency of Communication with Friends and Family: Not on file  . Frequency of Social Gatherings with Friends and Family: Not on  file  . Attends Religious Services: Not on file  . Active Member of Clubs or Organizations: Not on file  . Attends Archivist  Meetings: Not on file  . Marital Status: Not on file  Intimate Partner Violence:   . Fear of Current or Ex-Partner: Not on file  . Emotionally Abused: Not on file  . Physically Abused: Not on file  . Sexually Abused: Not on file    Family History  Problem Relation Age of Onset  . COPD Father        was a smoker  . Arthritis Father   . Asthma Sister   . Cancer Sister   . Alcohol abuse Brother     Past Medical History:  Diagnosis Date  . Cardiomyopathy, nonischemic ([place])   . Diverticulosis of colon   . ED (erectile dysfunction)   . Gout   . Hearing loss   . Hx of colonic polyps   . Hypercholesteremia 12/05/2018  . Hyperlipidemia   . Nasal septal deviation   . Obesity   . OSA (obstructive sleep apnea)   . PAF (paroxysmal atrial fibrillation) ([place])   . Pulmonary sarcoidosis ([place])  1997    biopsy proven    Past Surgical History:  Procedure Laterality Date  . CATARACT EXTRACTION    . HERNIA REPAIR  2009  . LUNG BIOPSY  1997  . TEE WITHOUT CARDIOVERSION N/A 11/25/2018   Procedure: TRANSESOPHAGEAL ECHOCARDIOGRAM (TEE);  Surgeon: Nigel Mormon, MD;  Location: [facility] ENDOSCOPY;  Service: Cardiovascular;  Laterality: N/A;  . TONSILLECTOMY  1979    Current Outpatient Medications  Medication Sig Dispense Refill  . albuterol (PROAIR HFA) 108 (90 Base) MCG/ACT inhaler INHALE 2 PUFFS INTO THE LUNGS EVERY 6 (SIX) HOURS AS NEEDED FOR WHEEZING. 8.5 each 4  . Ascorbic Acid (VITAMIN C) 1000 MG tablet Take 1,000 mg by mouth daily.    . Cyanocobalamin (VITAMIN B 12 PO) Take 1 tablet by mouth daily.    . diphenhydrAMINE (BENADRYL) 25 MG tablet Take 25 mg by mouth every 6 (six) hours as needed for allergies.    . fluticasone (FLONASE) 50 MCG/ACT nasal spray Place 1 spray into both nostrils as needed.    . hydrocortisone (ANUSOL-HC) 2.5 % rectal cream Place 1 application rectally 2 (two) times daily as needed for hemorrhoids (itching).    Marland Kitchen ibuprofen (ADVIL) 200 MG tablet Take 200 mg by  mouth as needed.    . metroNIDAZOLE (FLAGYL) 500 MG tablet Take 1 tablet (500 mg total) by mouth 3 (three) times daily as needed (flareup).    . Pyridoxine HCl (VITAMIN B-6 PO) Take 1 tablet by mouth daily.    . sildenafil (VIAGRA) 100 MG tablet Take 0.5-1 tablets (50-100 mg total) by mouth daily as needed for erectile dysfunction. 5 tablet 11  . valACYclovir (VALTREX) 1000 MG tablet Take 1 g by mouth as directed. Take 1 tablet daily for 3-4 days as needed for flare ups  2  . vitamin E 100 UNIT capsule Take 100 Units by mouth daily.     No current facility-administered medications for this visit.    Allergies as of 10/06/2019 - Review Complete 10/06/2019  Allergen Reaction Noted  . Ciprofloxacin Rash 02/16/2016  . Statins  11/15/2018    Vitals:  Regular rate and rhythm-   BP 133/90   Pulse 61   Temp (!) 97.4 F (36.3 C)   Ht 6' (1.829 m)   Wt 229 lb (103.9 kg)  BMI 31.06 kg/m  Last Weight:  Wt Readings from Last 1 Encounters:  10/06/19 229 lb (103.9 kg)   [id number] mass index is 31.06 kg/m.     Last Height:   Ht Readings from Last 1 Encounters:  10/06/19 6' (1.829 m)    Physical exam:  General: The patient is awake, alert and appears not in acute distress. The patient is well groomed. Head: Normocephalic, atraumatic. Neck is supple. Mallampati ,  neck circumference:17" . Nasal airflow now patent-.  TMJ click is not evident . Retrognathia is not seen. (Wore braces).  Cardiovascular:  Regular rate and rhythm , without  murmurs or carotid bruit, and without distended neck veins. Respiratory: Lungs are clear to auscultation. Skin:  Without evidence of edema, or rash Trunk: BMI is 31/ kg/m2. The patient's posture is erect.  Neurologic exam :The patient is awake and alert, oriented to place and time.   Memory subjective described as intact.Attention span & concentration ability appears normal. Speech is fluent, without dysarthria, dysphonia or aphasia. Mood and affect are  appropriate. No loss of taste or smell- recovered after ENT surgery.   Cranial nerves:Pupils are equal and briskly reactive to light. Funduscopic exam without evidence of pallor or edema. Extraocular movements  in vertical and horizontal planes intact and without nystagmus. Visual fields by finger perimetry are intact. Hearing loss in the left - with a loud tinnitus, has also tinnitus on the right.ear.  Facial sensation intact to fine touch.  Facial motor strength is symmetric and tongue and uvula move midline. Shoulder shrug was symmetrical.   Motor exam:  Normal tone, muscle bulk and symmetric strength in all extremities.  Sensory:  Fine touch, pinprick and vibration were normal.  Gait and station: Patient walks without assistive device and is able unassisted to climb up to the exam table. Strength within normal limits. Stance is stable and normal. His vestibular sytem is impaired.  Toe stand was tested. Deep tendon reflexes: in the upper and lower extremities are symmetric and intact.    Assessment:  After physical and neurologic examination, review of laboratory studies,  Personal review of imaging studies, reports  of polysomnography and / or neurophysiology testing and pre-existing records as far as provided in visit., my assessment is   1) restart CPAP once surgeon approves his using it. He may start auto CPAP with a restricted maximal pressure to 12 cm- from last setting of 18 cm water, heated humidification .   RV  In 3 month can be virturally- I like to have a download of CPAP data, see where his new 95% will be.   Larey Seat, MD [id number], 0000000 AM  Certified in Neurology by ABPN Certified in [place] by [facility] [address] [place], [place] [address]

## 2019-10-06 NOTE — Addendum Note (Signed)
Addended by: Larey Seat on: 10/06/2019 11:21 AM   Modules accepted: Orders

## 2019-10-06 NOTE — Patient Instructions (Signed)
Assessment:  After physical and neurologic examination, review of laboratory studies,  Personal review of imaging studies, reports  of polysomnography and / or neurophysiology testing and pre-existing records as far as provided in visit., my assessment is;    Successfully re-instated nasal airway passage - ENT surgeon Dr Cherlyn Cushing. [place]-  Patient will restart CPAP once surgeon approves using nasal airflow pressure . He may start auto CPAP with a restricted maximal pressure to 12 cm- from last setting of 18 cm water, heated humidification .   RV  In 3 month can be virturally- I like to have a download of CPAP data, see where his new 95% will be.   Larey Seat, MD [id number], 0000000 AM  Certified in Neurology by ABPN

## 2019-10-28 DIAGNOSIS — H905 Unspecified sensorineural hearing loss: Secondary | ICD-10-CM | POA: Diagnosis not present

## 2019-10-28 DIAGNOSIS — H9313 Tinnitus, bilateral: Secondary | ICD-10-CM | POA: Diagnosis not present

## 2019-11-06 DIAGNOSIS — M1A09X0 Idiopathic chronic gout, multiple sites, without tophus (tophi): Secondary | ICD-10-CM | POA: Diagnosis not present

## 2019-11-06 DIAGNOSIS — R200 Anesthesia of skin: Secondary | ICD-10-CM | POA: Diagnosis not present

## 2019-11-06 DIAGNOSIS — M79671 Pain in right foot: Secondary | ICD-10-CM | POA: Diagnosis not present

## 2019-11-06 DIAGNOSIS — M170 Bilateral primary osteoarthritis of knee: Secondary | ICD-10-CM | POA: Diagnosis not present

## 2020-01-05 ENCOUNTER — Telehealth: Payer: BC Managed Care – PPO | Admitting: Neurology

## 2020-01-06 ENCOUNTER — Encounter: Payer: Self-pay | Admitting: Neurology

## 2020-01-07 DIAGNOSIS — M950 Acquired deformity of nose: Secondary | ICD-10-CM | POA: Diagnosis not present

## 2020-01-07 DIAGNOSIS — J342 Deviated nasal septum: Secondary | ICD-10-CM | POA: Diagnosis not present

## 2020-01-07 DIAGNOSIS — J3489 Other specified disorders of nose and nasal sinuses: Secondary | ICD-10-CM | POA: Diagnosis not present

## 2020-02-17 DIAGNOSIS — L820 Inflamed seborrheic keratosis: Secondary | ICD-10-CM | POA: Diagnosis not present

## 2020-02-17 DIAGNOSIS — L821 Other seborrheic keratosis: Secondary | ICD-10-CM | POA: Diagnosis not present

## 2020-02-17 DIAGNOSIS — L738 Other specified follicular disorders: Secondary | ICD-10-CM | POA: Diagnosis not present

## 2020-02-17 DIAGNOSIS — L309 Dermatitis, unspecified: Secondary | ICD-10-CM | POA: Diagnosis not present

## 2020-03-02 DIAGNOSIS — H905 Unspecified sensorineural hearing loss: Secondary | ICD-10-CM | POA: Diagnosis not present

## 2020-03-07 ENCOUNTER — Other Ambulatory Visit: Payer: Self-pay | Admitting: Family Medicine

## 2020-03-08 ENCOUNTER — Encounter: Payer: Self-pay | Admitting: Family Medicine

## 2020-03-08 ENCOUNTER — Telehealth (INDEPENDENT_AMBULATORY_CARE_PROVIDER_SITE_OTHER): Payer: BC Managed Care – PPO | Admitting: Family Medicine

## 2020-03-08 VITALS — Ht 72.0 in | Wt 230.0 lb

## 2020-03-08 DIAGNOSIS — K5730 Diverticulosis of large intestine without perforation or abscess without bleeding: Secondary | ICD-10-CM

## 2020-03-08 MED ORDER — METRONIDAZOLE 500 MG PO TABS: 500.0000 mg | ORAL_TABLET | Freq: Three times a day (TID) | ORAL | 0 refills | Status: DC

## 2020-03-08 MED ORDER — LEVOFLOXACIN 750 MG PO TABS: 750.0000 mg | ORAL_TABLET | Freq: Every day | ORAL | 0 refills | Status: DC

## 2020-03-08 NOTE — Progress Notes (Signed)
   Jason Weaver is a 64 y.o. male who presents today for a virtual office visit.  Assessment/Plan:  Chronic Problems Addressed Today: Diverticulosis of large intestine With mild flare of diverticulitis.  Will start Levaquin and Flagyl at this is worked well for him in the past.  He will follow-up with me in a couple weeks for his CPE.  Discussed reasons to return to care earlier.    Subjective:  HPI:  Patient with flareup of diverticulitis.  Started about 5 days ago.  Thinks it was precipitated by eating sesame seeds with a fish sandwich.  He has had persistent abdominal pain and cramping.  Some diarrhea.  No melena or hematochezia.  Feels similar to past diverticulitis flares.       Objective/Observations  Physical Exam: Gen: NAD, resting comfortably Pulm: Normal work of breathing Neuro: Grossly normal, moves all extremities Psych: Normal affect and thought content  Virtual Visit via Video   I connected with Jason Weaver on 03/08/20 at 11:00 AM EDT by a video enabled telemedicine application and verified that I am speaking with the correct person using two identifiers. The limitations of evaluation and management by telemedicine and the availability of in person appointments were discussed. The patient expressed understanding and agreed to proceed.   Patient location: Home Provider location: [place] participating in the virtual visit: Myself and Patient     Algis Greenhouse. Jerline Pain, MD 03/08/2020 11:13 AM

## 2020-03-08 NOTE — Assessment & Plan Note (Signed)
With mild flare of diverticulitis.  Will start Levaquin and Flagyl at this is worked well for him in the past.  He will follow-up with me in a couple weeks for his CPE.  Discussed reasons to return to care earlier.

## 2020-03-23 ENCOUNTER — Encounter: Payer: Self-pay | Admitting: Family Medicine

## 2020-03-23 ENCOUNTER — Other Ambulatory Visit: Payer: Self-pay

## 2020-03-23 ENCOUNTER — Ambulatory Visit (INDEPENDENT_AMBULATORY_CARE_PROVIDER_SITE_OTHER): Payer: BC Managed Care – PPO | Admitting: Family Medicine

## 2020-03-23 VITALS — BP 110/88 | HR 69 | Temp 97.7°F | Ht 72.0 in | Wt 224.2 lb

## 2020-03-23 DIAGNOSIS — Z0001 Encounter for general adult medical examination with abnormal findings: Secondary | ICD-10-CM

## 2020-03-23 DIAGNOSIS — H8109 Meniere's disease, unspecified ear: Secondary | ICD-10-CM | POA: Insufficient documentation

## 2020-03-23 DIAGNOSIS — Z6830 Body mass index (BMI) 30.0-30.9, adult: Secondary | ICD-10-CM

## 2020-03-23 DIAGNOSIS — G4700 Insomnia, unspecified: Secondary | ICD-10-CM

## 2020-03-23 DIAGNOSIS — M1A09X0 Idiopathic chronic gout, multiple sites, without tophus (tophi): Secondary | ICD-10-CM

## 2020-03-23 DIAGNOSIS — Z125 Encounter for screening for malignant neoplasm of prostate: Secondary | ICD-10-CM | POA: Diagnosis not present

## 2020-03-23 DIAGNOSIS — R739 Hyperglycemia, unspecified: Secondary | ICD-10-CM | POA: Diagnosis not present

## 2020-03-23 DIAGNOSIS — E785 Hyperlipidemia, unspecified: Secondary | ICD-10-CM

## 2020-03-23 DIAGNOSIS — S29012A Strain of muscle and tendon of back wall of thorax, initial encounter: Secondary | ICD-10-CM

## 2020-03-23 DIAGNOSIS — E669 Obesity, unspecified: Secondary | ICD-10-CM

## 2020-03-23 DIAGNOSIS — H8103 Meniere's disease, bilateral: Secondary | ICD-10-CM

## 2020-03-23 DIAGNOSIS — N529 Male erectile dysfunction, unspecified: Secondary | ICD-10-CM | POA: Diagnosis not present

## 2020-03-23 LAB — PSA: PSA: 2.28 ng/mL (ref 0.10–4.00)

## 2020-03-23 LAB — URIC ACID: Uric Acid, Serum: 7.3 mg/dL (ref 4.0–7.8)

## 2020-03-23 LAB — COMPREHENSIVE METABOLIC PANEL
ALT: 14 U/L (ref 0–53)
AST: 13 U/L (ref 0–37)
Albumin: 4.4 g/dL (ref 3.5–5.2)
Alkaline Phosphatase: 76 U/L (ref 39–117)
BUN: 18 mg/dL (ref 6–23)
CO2: 28 mEq/L (ref 19–32)
Calcium: 9.9 mg/dL (ref 8.4–10.5)
Chloride: 102 mEq/L (ref 96–112)
Creatinine, Ser: 1 mg/dL (ref 0.40–1.50)
GFR: 75.69 mL/min (ref >60.00)
Glucose, Bld: 93 mg/dL (ref 70–99)
Potassium: 4.6 mEq/L (ref 3.5–5.1)
Sodium: 137 mEq/L (ref 135–145)
Total Bilirubin: 0.9 mg/dL (ref 0.2–1.2)
Total Protein: 6.8 g/dL (ref 6.0–8.3)

## 2020-03-23 LAB — CBC
HCT: 47.3 % (ref 39.0–52.0)
Hemoglobin: 16.7 g/dL (ref 13.0–17.0)
MCHC: 35.3 g/dL (ref 30.0–36.0)
MCV: 90.6 fl (ref 78.0–100.0)
Platelets: 246 10*3/uL (ref 150.0–400.0)
RBC: 5.22 Mil/uL (ref 4.22–5.81)
RDW: 13.2 % (ref 11.5–15.5)
WBC: 7 10*3/uL (ref 4.0–10.5)

## 2020-03-23 LAB — VITAMIN B12: Vitamin B-12: 334 pg/mL (ref 211–911)

## 2020-03-23 LAB — TSH: TSH: 3.01 u[IU]/mL (ref 0.35–4.50)

## 2020-03-23 LAB — HEMOGLOBIN A1C: Hgb A1c MFr Bld: 5.4 % (ref 4.6–6.5)

## 2020-03-23 NOTE — Assessment & Plan Note (Signed)
Check uric acid.  Continue Uloric as per rheumatology.

## 2020-03-23 NOTE — Assessment & Plan Note (Signed)
Continue management per ENT.  Will check C met as this has not been checked since he was started on Dyazide.

## 2020-03-23 NOTE — Assessment & Plan Note (Signed)
Check lipid panel  

## 2020-03-23 NOTE — Assessment & Plan Note (Signed)
Stable.  Continue Viagra as needed.

## 2020-03-23 NOTE — Progress Notes (Signed)
Chief Complaint:  Jason Weaver is a 64 y.o. male who presents today for his annual comprehensive physical exam.    Assessment/Plan:  New/Acute Problems: Left rhomboid strain Discussed home exercises.  Handout was given.  Will follow up with me if not improving.  Chronic Problems Addressed Today: Gout Check uric acid.  Continue Uloric as per rheumatology.  Dyslipidemia with statin intolerance Check lipid panel.  Meniere's disease Continue management per ENT.  Will check C met as this has not been checked since he was started on Dyazide.  Erectile dysfunction Stable.  Continue Viagra as needed.   Body mass index is 30.41 kg/m. / Obese BMI Metric Follow Up - 03/23/20 0824      BMI Metric Follow Up-Please document annually   BMI Metric Follow Up  Education provided        Preventative Healthcare: Up-to-date on colon cancer screening.  Due for next colonoscopy in 2026.  Check CBC, C met, TSH, A1c, PSA.  Discussed Covid vaccine - he is holding off for now.   Patient Counseling(The following topics were reviewed and/or handout was given):  -Nutrition: Stressed importance of moderation in sodium/caffeine intake, saturated fat and cholesterol, caloric balance, sufficient intake of fresh fruits, vegetables, and fiber.  -Stressed the importance of regular exercise.   -Substance Abuse: Discussed cessation/primary prevention of tobacco, alcohol, or other drug use; driving or other dangerous activities under the influence; availability of treatment for abuse.   -Injury prevention: Discussed safety belts, safety helmets, smoke detector, smoking near bedding or upholstery.   -Sexuality: Discussed sexually transmitted diseases, partner selection, use of condoms, avoidance of unintended pregnancy and contraceptive alternatives.   -Dental health: Discussed importance of regular tooth brushing, flossing, and dental visits.  -Health maintenance and immunizations reviewed. Please refer to  Health maintenance section.  Return to care in 1 year for next preventative visit.     Subjective:  HPI:  He has no acute complaints today.   He has had intermittent left mid back pain for the past several months.  Symptoms are overall stable.  No specific treatments tried.  No specific injuries.  Lifestyle Diet: Balanced.  Exercise: Exercise and stretching on the weekends.   Depression screen PHQ 2/9 10/20/2018  Decreased Interest 0  Down, Depressed, Hopeless 0  PHQ - 2 Score 0    Health Maintenance Due  Topic Date Due  . COVID-19 Vaccine (1) Never done     ROS: Per HPI, otherwise a complete review of systems was negative.   PMH:  The following were reviewed and entered/updated in epic: Past Medical History:  Diagnosis Date  . Cardiomyopathy, nonischemic ([place])   . Diverticulosis of colon   . ED (erectile dysfunction)   . Gout   . Hearing loss   . Hx of colonic polyps   . Hypercholesteremia 12/05/2018  . Hyperlipidemia   . Nasal septal deviation   . Obesity   . OSA (obstructive sleep apnea)   . PAF (paroxysmal atrial fibrillation) ([place])   . 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 Meniere's disease 03/23/2020  . 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 CVA (cerebrovascular accident) ([place]) 11/14/2018  . Numbness and tingling of foot 10/20/2018  . Insomnia 10/20/2018  . Erectile dysfunction 10/20/2018  . Anal fissure 10/20/2018  . Cold sore 10/20/2018  . Arthritis of ankle or foot, degenerative, left 04/20/2014  . Allergic rhinitis 05/29/2012  .  OSA (obstructive sleep apnea) 06/04/2011  . Pulmonary sarcoidosis ([place]) 01/11/2011  . Gout 11/11/2009  . Diverticulosis of large intestine 11/11/2009  . History of colonic polyps 11/11/2009  . Dyslipidemia with statin intolerance 10/24/2009  . Deafness, left 08/23/2009   Past Surgical History:  Procedure Laterality Date  . CATARACT  EXTRACTION    . HERNIA REPAIR  2009  . LUNG BIOPSY  1997  . TEE WITHOUT CARDIOVERSION N/A 11/25/2018   Procedure: TRANSESOPHAGEAL ECHOCARDIOGRAM (TEE);  Surgeon: Nigel Mormon, MD;  Location: [facility] ENDOSCOPY;  Service: Cardiovascular;  Laterality: N/A;  . TONSILLECTOMY  1979    Family History  Problem Relation Age of Onset  . COPD Father        was a smoker  . Arthritis Father   . Asthma Sister   . Cancer Sister   . Alcohol abuse Brother     Medications- reviewed and updated Current Outpatient Medications  Medication Sig Dispense Refill  . albuterol (PROAIR HFA) 108 (90 Base) MCG/ACT inhaler INHALE 2 PUFFS INTO THE LUNGS EVERY 6 (SIX) HOURS AS NEEDED FOR WHEEZING. 8.5 each 4  . Ascorbic Acid (VITAMIN C) 1000 MG tablet Take 1,000 mg by mouth daily.    . Cyanocobalamin (VITAMIN B 12 PO) Take 1 tablet by mouth daily.    . diphenhydrAMINE (BENADRYL) 25 MG tablet Take 25 mg by mouth every 6 (six) hours as needed for allergies.    . Febuxostat 80 MG TABS     . fluticasone (FLONASE) 50 MCG/ACT nasal spray Place 1 spray into both nostrils as needed.    . hydrocortisone (ANUSOL-HC) 2.5 % rectal cream Place 1 application rectally 2 (two) times daily as needed for hemorrhoids (itching).    Marland Kitchen ibuprofen (ADVIL) 200 MG tablet Take 200 mg by mouth as needed.    . sildenafil (VIAGRA) 100 MG tablet Take 0.5-1 tablets (50-100 mg total) by mouth daily as needed for erectile dysfunction. 5 tablet 11  . triamterene-hydrochlorothiazide (DYAZIDE) 37.5-25 MG capsule Take by mouth.    . valACYclovir (VALTREX) 1000 MG tablet Take 1 g by mouth as directed. Take 1 tablet daily for 3-4 days as needed for flare ups  2  . vitamin E 100 UNIT capsule Take 100 Units by mouth daily.    Marland Kitchen augmented betamethasone dipropionate (DIPROLENE-AF) 0.05 % cream      No current facility-administered medications for this visit.    Allergies-reviewed and updated Allergies  Allergen Reactions  . Ciprofloxacin Rash  .  Statins     Muscle pain and generalized weakness.     Social History   Socioeconomic History  . Marital status: Married    Spouse name: Not on file  . Number of children: 0  . Years of education: 16  . Highest education level: Bachelor's degree (e.g., BA, AB, BS)  Occupational History  . 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 Smoking status: Passive Smoke Exposure - Never Smoker  . Smokeless tobacco: Never Used  Substance and Sexual Activity  . Alcohol use: Yes    Alcohol/week: 2.0 standard drinks    Types: 2 Glasses of wine per week  . Drug use: Never  . Sexual activity: Yes  Other Topics Concern  . Not on file  Social History Narrative   Lives with wife in a one story home.  No children.  Works for [employer].  Education: college.    Social Determinants of Health   Financial Resource Strain:   .  Difficulty of Paying Living Expenses:   Food Insecurity:   . Worried About Charity fundraiser in the Last Year:   . Arboriculturist in the Last Year:   Transportation Needs:   . Film/video editor (Medical):   Marland Kitchen Lack of Transportation (Non-Medical):   Physical Activity:   . Days of Exercise per Week:   . Minutes of Exercise per Session:   Stress:   . Feeling of Stress :   Social Connections:   . Frequency of Communication with Friends and Family:   . Frequency of Social Gatherings with Friends and Family:   . Attends Religious Services:   . Active Member of Clubs or Organizations:   . Attends Archivist Meetings:   Marland Kitchen Marital Status:         Objective:  Physical Exam: BP 110/88   Pulse 69   Temp 97.7 F (36.5 C) (Temporal)   Ht 6' (1.829 m)   Wt 224 lb 3.2 oz (101.7 kg)   SpO2 99%   BMI 30.41 kg/m   Body mass index is 30.41 kg/m. Wt Readings from Last 3 Encounters:  03/23/20 224 lb 3.2 oz (101.7 kg)  03/08/20 230 lb (104.3 kg)  10/06/19 229 lb (103.9 kg)   Gen: NAD, resting comfortably HEENT: TMs normal bilaterally. OP  clear. No thyromegaly noted.  CV: RRR with no murmurs appreciated Pulm: NWOB, CTAB with no crackles, wheezes, or rhonchi GI: Normal bowel sounds present. Soft, Nontender, Nondistended. MSK: no edema, cyanosis, or clubbing noted.  Tender to palpation along left rhomboid. Skin: warm, dry Neuro: CN2-12 grossly intact. Strength 5/5 in upper and lower extremities. Reflexes symmetric and intact bilaterally.  Psych: Normal affect and thought content     Samella Lucchetti M. Jerline Pain, MD 03/23/2020 9:00 AM

## 2020-03-23 NOTE — Patient Instructions (Signed)
It was very nice to see you today!  You have a rhomboid strain.  Please work on the stretches and exercises.  We will check blood work today.  Please come back in year for your next checkup, or sooner if needed.  Take care, Dr Jerline Pain  Please try these tips to maintain a healthy lifestyle:   Eat at least 3 REAL meals and 1-2 snacks per day.  Aim for no more than 5 hours between eating.  If you eat breakfast, please do so within one hour of getting up.    Each meal should contain half fruits/vegetables, one quarter protein, and one quarter carbs (no bigger than a computer mouse)   Cut down on sweet beverages. This includes juice, soda, and sweet tea.     Drink at least 1 glass of water with each meal and aim for at least 8 glasses per day   Exercise at least 150 minutes every week.    Preventive Care 76-22 Years Old, Male Preventive care refers to lifestyle choices and visits with your health care provider that can promote health and wellness. This includes:  A yearly physical exam. This is also called an annual well check.  Regular dental and eye exams.  Immunizations.  Screening for certain conditions.  Healthy lifestyle choices, such as eating a healthy diet, getting regular exercise, not using drugs or products that contain nicotine and tobacco, and limiting alcohol use. What can I expect for my preventive care visit? Physical exam Your health care provider will check:  Height and weight. These may be used to calculate body mass index (BMI), which is a measurement that tells if you are at a healthy weight.  Heart rate and blood pressure.  Your skin for abnormal spots. Counseling Your health care provider may ask you questions about:  Alcohol, tobacco, and drug use.  Emotional well-being.  Home and relationship well-being.  Sexual activity.  Eating habits.  Work and work Statistician. What immunizations do I need?  Influenza (flu) vaccine  This is  recommended every year. Tetanus, diphtheria, and pertussis (Tdap) vaccine  You may need a Td booster every 10 years. Varicella (chickenpox) vaccine  You may need this vaccine if you have not already been vaccinated. Zoster (shingles) vaccine  You may need this after age 44. Measles, mumps, and rubella (MMR) vaccine  You may need at least one dose of MMR if you were born in 1957 or later. You may also need a second dose. Pneumococcal conjugate (PCV13) vaccine  You may need this if you have certain conditions and were not previously vaccinated. Pneumococcal polysaccharide (PPSV23) vaccine  You may need one or two doses if you smoke cigarettes or if you have certain conditions. Meningococcal conjugate (MenACWY) vaccine  You may need this if you have certain conditions. Hepatitis A vaccine  You may need this if you have certain conditions or if you travel or work in places where you may be exposed to hepatitis A. Hepatitis B vaccine  You may need this if you have certain conditions or if you travel or work in places where you may be exposed to hepatitis B. Haemophilus influenzae type b (Hib) vaccine  You may need this if you have certain risk factors. Human papillomavirus (HPV) vaccine  If recommended by your health care provider, you may need three doses over 6 months. You may receive vaccines as individual doses or as more than one vaccine together in one shot (combination vaccines). Talk with your health  care provider about the risks and benefits of combination vaccines. What tests do I need? Blood tests  Lipid and cholesterol levels. These may be checked every 5 years, or more frequently if you are over 57 years old.  Hepatitis C test.  Hepatitis B test. Screening  Lung cancer screening. You may have this screening every year starting at age 21 if you have a 30-pack-year history of smoking and currently smoke or have quit within the past 15 years.  Prostate cancer  screening. Recommendations will vary depending on your family history and other risks.  Colorectal cancer screening. All adults should have this screening starting at age 61 and continuing until age 59. Your health care provider may recommend screening at age 91 if you are at increased risk. You will have tests every 1-10 years, depending on your results and the type of screening test.  Diabetes screening. This is done by checking your blood sugar (glucose) after you have not eaten for a while (fasting). You may have this done every 1-3 years.  Sexually transmitted disease (STD) testing. Follow these instructions at home: Eating and drinking  Eat a diet that includes fresh fruits and vegetables, whole grains, lean protein, and low-fat dairy products.  Take vitamin and mineral supplements as recommended by your health care provider.  Do not drink alcohol if your health care provider tells you not to drink.  If you drink alcohol: ? Limit how much you have to 0-2 drinks a day. ? Be aware of how much alcohol is in your drink. In the [place] one drink equals one 12 oz bottle of beer (355 mL), one 5 oz glass of wine (148 mL), or one 1 oz glass of hard liquor (44 mL). Lifestyle  Take daily care of your teeth and gums.  Stay active. Exercise for at least 30 minutes on 5 or more days each week.  Do not use any products that contain nicotine or tobacco, such as cigarettes, e-cigarettes, and chewing tobacco. If you need help quitting, ask your health care provider.  If you are sexually active, practice safe sex. Use a condom or other form of protection to prevent STIs (sexually transmitted infections).  Talk with your health care provider about taking a low-dose aspirin every day starting at age 50. What's next?  Go to your health care provider once a year for a well check visit.  Ask your health care provider how often you should have your eyes and teeth checked.  Stay up to date on all  vaccines. This information is not intended to replace advice given to you by your health care provider. Make sure you discuss any questions you have with your health care provider. Document Revised: 10/02/2018 Document Reviewed: 10/02/2018 Elsevier Patient Education  2020 [employer].

## 2020-03-25 ENCOUNTER — Other Ambulatory Visit: Payer: Self-pay | Admitting: *Deleted

## 2020-03-25 ENCOUNTER — Telehealth: Payer: Self-pay | Admitting: *Deleted

## 2020-03-25 DIAGNOSIS — E785 Hyperlipidemia, unspecified: Secondary | ICD-10-CM

## 2020-03-25 DIAGNOSIS — R739 Hyperglycemia, unspecified: Secondary | ICD-10-CM

## 2020-03-25 LAB — LIPID PANEL
Cholesterol: 256 mg/dL — ABNORMAL HIGH (ref 0–200)
HDL: 51 mg/dL (ref >39.00)
NonHDL: 204.51
Total CHOL/HDL Ratio: 5
Triglycerides: 250 mg/dL — ABNORMAL HIGH (ref 0.0–149.0)
VLDL: 50 mg/dL — ABNORMAL HIGH (ref 0.0–40.0)

## 2020-03-25 LAB — LDL CHOLESTEROL, DIRECT: Direct LDL: 155 mg/dL

## 2020-03-25 IMAGING — MR MR HEAD WO/W CM
12 series · 48 of 48 positions shown · IV contrast (multihance)
Comparison: 11/09/2018 CT head.  02/25/2008 MRI of the head.

CLINICAL DATA: 62 y/o M; Neuro deficit(s), subacute proprioception
issue on right UE. Stroke symptoms since 11/09/2018.

EXAM:
MRI HEAD WITHOUT AND WITH CONTRAST
TECHNIQUE: Multiplanar, multiecho pulse sequences of the brain and surrounding
structures were obtained without and with intravenous contrast.
CONTRAST:  20mL MULTIHANCE GADOBENATE DIMEGLUMINE 529 MG/ML IV SOLN

[Series 5: T1 · sagittal · 4.0mm · 0.75mm/px · 1 of 31 slices shown (1 of 3)]
[im 1/31]
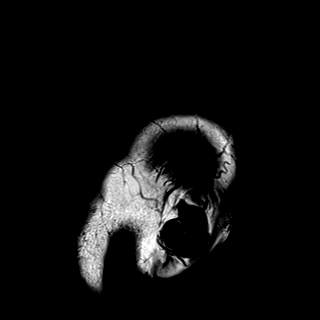

[Series 6: DWI · axial · 3.0mm · 1.44mm/px · z∈[-60,+82]mm · 4 of 90 slices shown (1 of 4)]
[im 1/90]
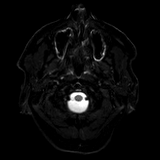
[im 30/90]
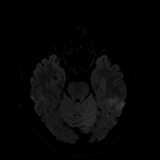
[im 60/90]
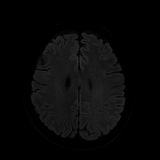
[im 90/90]
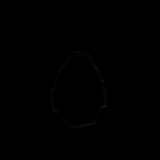

[Series 7: DWI · axial · 3.0mm · 1.44mm/px · z∈[-60,+82]mm · 3 of 43 slices shown (2 of 4)]
[im 1/43]
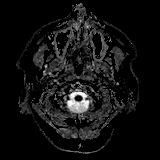
[im 22/43]
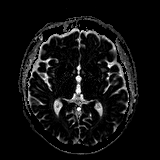
[im 43/43]
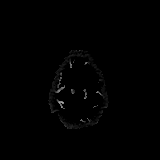

[Series 8: DWI · coronal · 5.0mm · 1.44mm/px · 4 of 68 slices shown (3 of 4)]
[im 1/68]
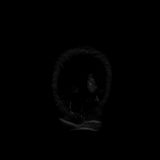
[im 23/68]
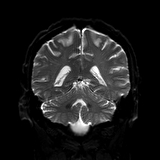
[im 45/68]
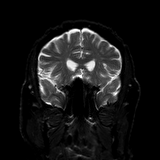
[im 68/68]
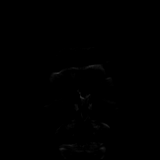

[Series 9: DWI · coronal · 5.0mm · 1.44mm/px · 2 of 34 slices shown (4 of 4)]
[im 1/34]
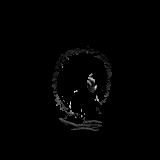
[im 34/34]
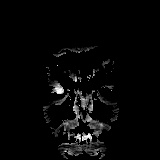

[Series 10: T2 · axial · 4.0mm · 0.36mm/px · z∈[-61,+81]mm · 2 of 29 slices shown]
[im 1/29]
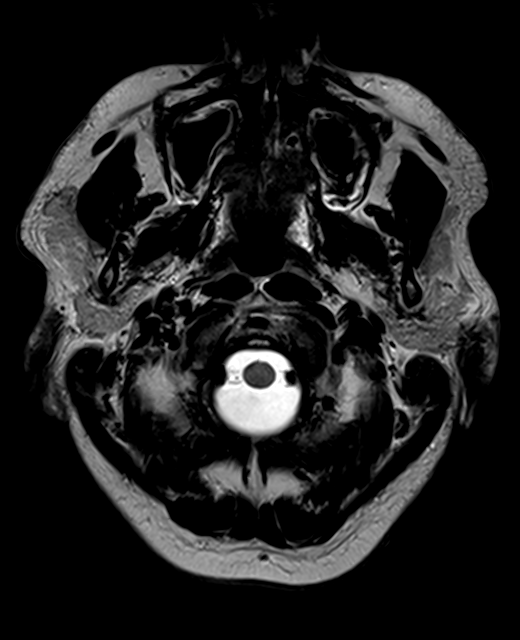
[im 29/29]
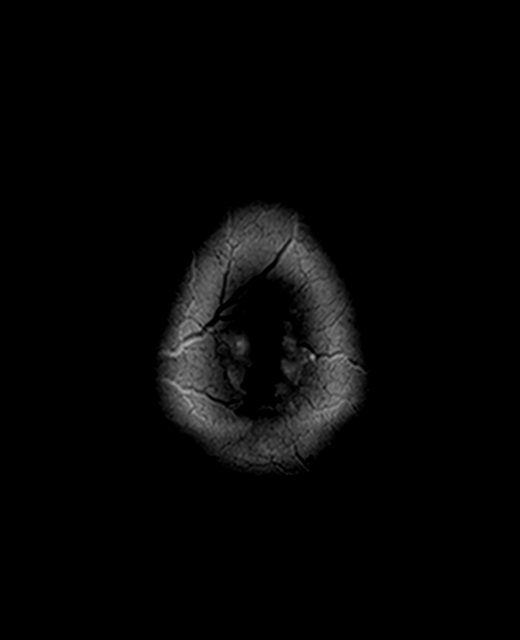

[Series 11: FLAIR · axial · 3.0mm · 0.72mm/px · z∈[-63,+83]mm · 2 of 26 slices shown]
[im 1/26]
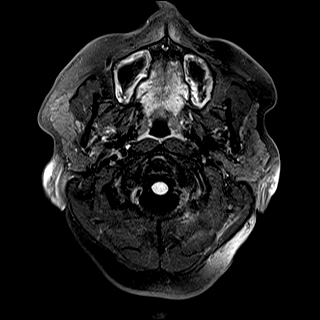
[im 26/26]
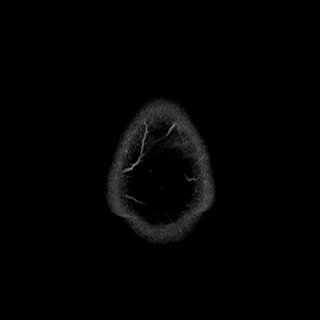

[Series 13: swi_images · axial · 1.5mm · 0.90mm/px · z∈[-60,+79]mm · 6 of 96 slices shown]
[im 1/96]
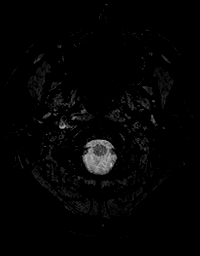
[im 20/96]
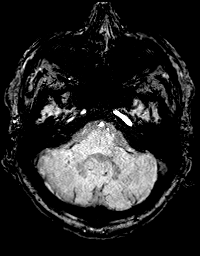
[im 39/96]
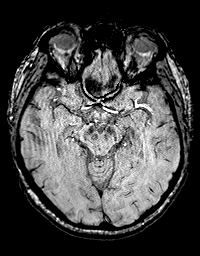
[im 58/96]
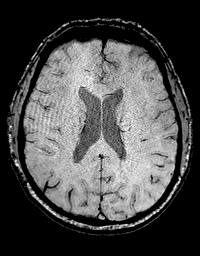
[im 77/96]
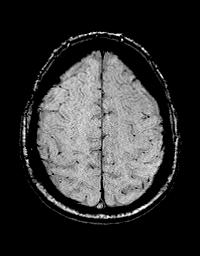
[im 96/96]
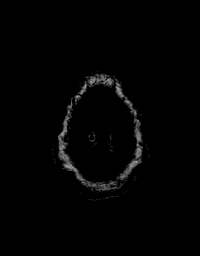

[Series 14: T1 · axial · 1.0mm · 0.90mm/px · z∈[-69,+86]mm · 10 of 160 slices shown (2 of 3)]
[im 1/160]
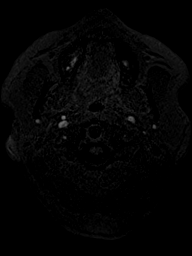
[im 18/160]
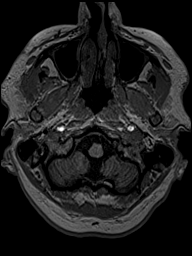
[im 36/160]
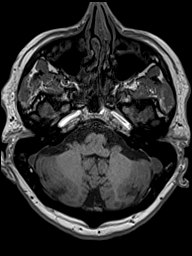
[im 54/160]
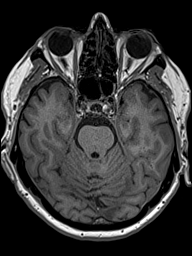
[im 71/160]
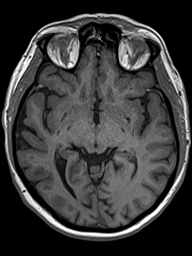
[im 89/160]
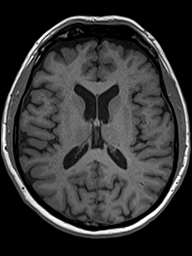
[im 107/160]
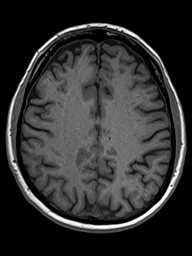
[im 124/160]
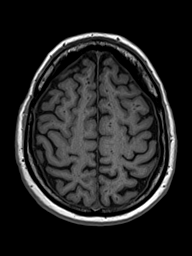
[im 142/160]
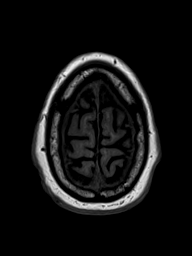
[im 160/160]
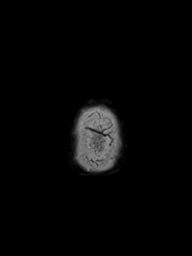

[Series 19: T2 post-contrast · coronal · 4.5mm · 0.36mm/px · 2 of 35 slices shown]
[im 1/35]
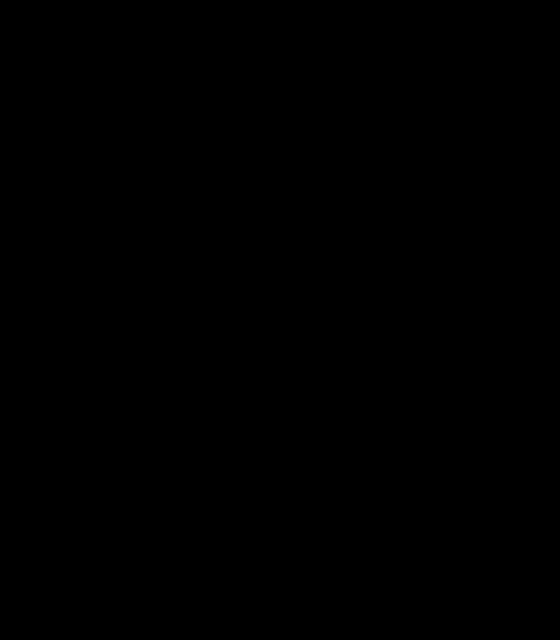
[im 35/35]
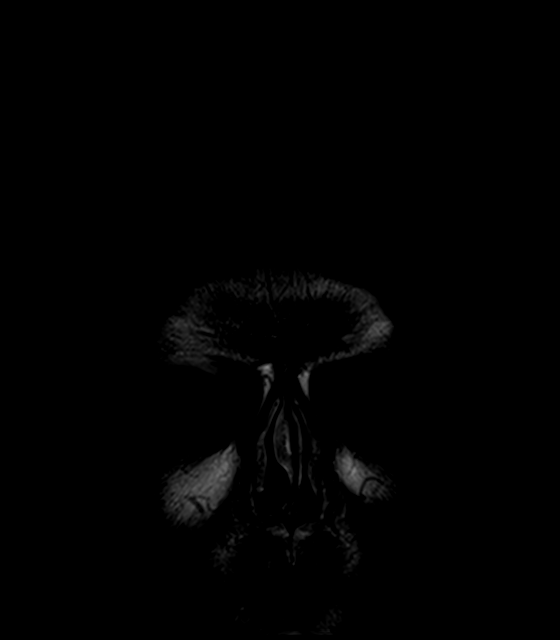

[Series 20: T1 · axial · 1.0mm · 0.90mm/px · z∈[-69,+86]mm · 10 of 160 slices shown (3 of 3)]
[im 1/160]
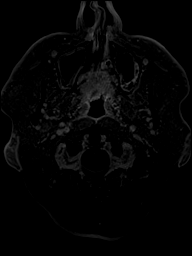
[im 18/160]
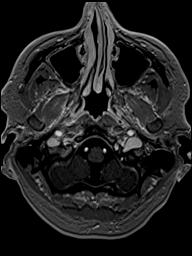
[im 36/160]
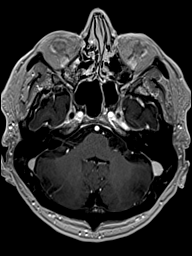
[im 54/160]
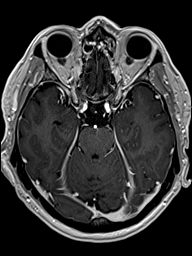
[im 71/160]
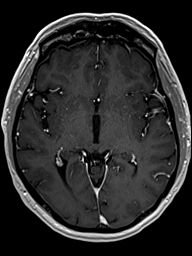
[im 89/160]
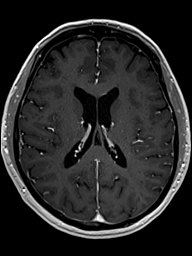
[im 107/160]
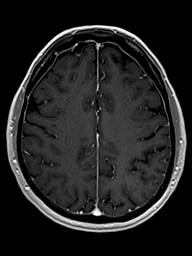
[im 124/160]
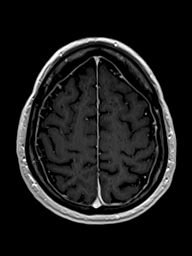
[im 142/160]
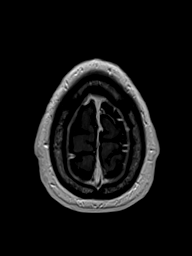
[im 160/160]
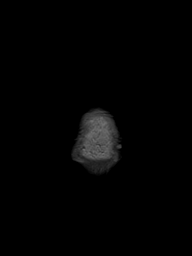

[Series 21: T1 post-contrast · coronal · 4.0mm · 0.72mm/px · 2 of 35 slices shown]
[im 1/35]
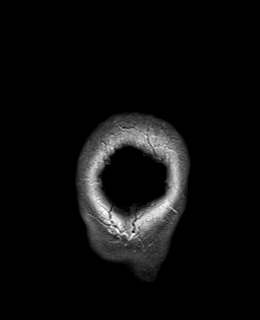
[im 35/35]
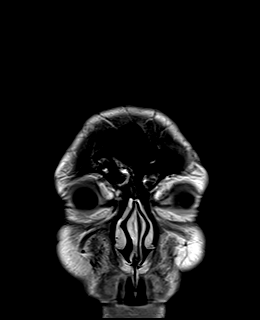

[48 of 48 positions shown; findings below may reference images not displayed]

FINDINGS: Brain: Group of small foci of mildly reduced diffusion, stippled
enhancement, and T2 FLAIR hyperintense signal spanning 7 x 16 mm (AP
by ML series 6, image 83) within the left superior precentral gyrus
compatible with early subacute infarction. Additional punctate focus
of subacute infarction is present within the left anterosuperior
frontal cortex (series 6, image 78). No associated hemorrhage or
mass effect.

Scattered punctate nonspecific T2 FLAIR hyperintensities in
subcortical and periventricular white matter are compatible with
mild chronic microvascular ischemic changes. Mild volume loss of the
brain. Mild progression of volume loss and microvascular ischemic
changes from 4445. No extra-axial collection, hydrocephalus, mass
effect, or herniation.

Vascular: Normal flow voids.

Skull and upper cervical spine: Normal marrow signal.

Sinuses/Orbits: Mild diffuse paranasal sinus mucosal thickening. No
abnormal signal of mastoid air cells. Right intra-ocular lens
replacement.

Other: None.
IMPRESSION: 1. Small early subacute infarction within the left superior
precentral gyrus and additional punctate focus in the left
anterosuperior frontal cortex. No associated hemorrhage or mass
effect.
2. Mild chronic microvascular ischemic changes and volume loss of
the brain. Mild progression from 4445.

These results will be called to the ordering clinician or
representative by the Radiologist Assistant, and communication
documented in the PACS or zVision Dashboard.

## 2020-03-25 NOTE — Telephone Encounter (Signed)
Ok with me. Please place any necessary orders. 

## 2020-03-25 NOTE — Telephone Encounter (Signed)
Pt will like to have cholesterol check  Is ok to add this test?

## 2020-03-25 NOTE — Progress Notes (Signed)
Please inform patient of the following:  Blood work is all NORMAL. Do not need to make any changes to his treatment plan at this time. Would like for him to continue working on diet and exercise and we can recheck in a year.  Algis Greenhouse. Jerline Pain, MD 03/25/2020 10:30 AM

## 2020-03-25 NOTE — Addendum Note (Signed)
Addended by: Betti Cruz on: 03/25/2020 04:01 PM   Modules accepted: Orders

## 2020-03-25 NOTE — Telephone Encounter (Signed)
Order placed lab notified of Add on

## 2020-03-28 NOTE — Progress Notes (Signed)
Please inform patient of the following:  Cholesterol levels stable compared to last year. Would like for him to continue working on diet and exercise and we can recheck in a year.  Jason Weaver. Jerline Pain, MD 03/28/2020 12:54 PM

## 2020-05-02 ENCOUNTER — Other Ambulatory Visit: Payer: Self-pay | Admitting: Family Medicine

## 2020-05-04 DIAGNOSIS — M170 Bilateral primary osteoarthritis of knee: Secondary | ICD-10-CM | POA: Diagnosis not present

## 2020-05-04 DIAGNOSIS — M79671 Pain in right foot: Secondary | ICD-10-CM | POA: Diagnosis not present

## 2020-05-04 DIAGNOSIS — R200 Anesthesia of skin: Secondary | ICD-10-CM | POA: Diagnosis not present

## 2020-05-04 DIAGNOSIS — M1A09X0 Idiopathic chronic gout, multiple sites, without tophus (tophi): Secondary | ICD-10-CM | POA: Diagnosis not present

## 2020-06-10 DIAGNOSIS — Z7189 Other specified counseling: Secondary | ICD-10-CM | POA: Diagnosis not present

## 2020-06-10 DIAGNOSIS — J101 Influenza due to other identified influenza virus with other respiratory manifestations: Secondary | ICD-10-CM | POA: Diagnosis not present

## 2020-06-10 DIAGNOSIS — Z20822 Contact with and (suspected) exposure to covid-19: Secondary | ICD-10-CM | POA: Diagnosis not present

## 2020-06-10 DIAGNOSIS — R0602 Shortness of breath: Secondary | ICD-10-CM | POA: Diagnosis not present

## 2020-06-10 DIAGNOSIS — R52 Pain, unspecified: Secondary | ICD-10-CM | POA: Diagnosis not present

## 2020-06-14 DIAGNOSIS — Z8719 Personal history of other diseases of the digestive system: Secondary | ICD-10-CM | POA: Diagnosis not present

## 2020-06-14 DIAGNOSIS — R140 Abdominal distension (gaseous): Secondary | ICD-10-CM | POA: Diagnosis not present

## 2020-06-14 DIAGNOSIS — Z86010 Personal history of colonic polyps: Secondary | ICD-10-CM | POA: Diagnosis not present

## 2020-06-14 DIAGNOSIS — K648 Other hemorrhoids: Secondary | ICD-10-CM | POA: Diagnosis not present

## 2020-06-24 ENCOUNTER — Telehealth: Payer: Self-pay | Admitting: Family Medicine

## 2020-06-24 NOTE — Telephone Encounter (Signed)
ERROR

## 2020-07-07 DIAGNOSIS — Z8709 Personal history of other diseases of the respiratory system: Secondary | ICD-10-CM | POA: Diagnosis not present

## 2020-07-07 DIAGNOSIS — M950 Acquired deformity of nose: Secondary | ICD-10-CM | POA: Diagnosis not present

## 2020-07-07 DIAGNOSIS — Z09 Encounter for follow-up examination after completed treatment for conditions other than malignant neoplasm: Secondary | ICD-10-CM | POA: Diagnosis not present

## 2020-07-25 DIAGNOSIS — Z1159 Encounter for screening for other viral diseases: Secondary | ICD-10-CM | POA: Diagnosis not present

## 2020-07-28 DIAGNOSIS — K648 Other hemorrhoids: Secondary | ICD-10-CM | POA: Diagnosis not present

## 2020-07-28 DIAGNOSIS — D123 Benign neoplasm of transverse colon: Secondary | ICD-10-CM | POA: Diagnosis not present

## 2020-07-28 DIAGNOSIS — D128 Benign neoplasm of rectum: Secondary | ICD-10-CM | POA: Diagnosis not present

## 2020-07-28 DIAGNOSIS — K5730 Diverticulosis of large intestine without perforation or abscess without bleeding: Secondary | ICD-10-CM | POA: Diagnosis not present

## 2020-07-28 DIAGNOSIS — D125 Benign neoplasm of sigmoid colon: Secondary | ICD-10-CM | POA: Diagnosis not present

## 2020-07-28 DIAGNOSIS — D124 Benign neoplasm of descending colon: Secondary | ICD-10-CM | POA: Diagnosis not present

## 2020-07-28 DIAGNOSIS — Z86010 Personal history of colonic polyps: Secondary | ICD-10-CM | POA: Diagnosis not present

## 2020-07-28 LAB — HM COLONOSCOPY

## 2020-08-16 DIAGNOSIS — M1611 Unilateral primary osteoarthritis, right hip: Secondary | ICD-10-CM | POA: Diagnosis not present

## 2020-08-16 DIAGNOSIS — M25551 Pain in right hip: Secondary | ICD-10-CM | POA: Diagnosis not present

## 2020-09-07 ENCOUNTER — Encounter: Payer: Self-pay | Admitting: Family Medicine

## 2020-09-23 DIAGNOSIS — E559 Vitamin D deficiency, unspecified: Secondary | ICD-10-CM | POA: Diagnosis not present

## 2020-09-23 DIAGNOSIS — Z131 Encounter for screening for diabetes mellitus: Secondary | ICD-10-CM | POA: Diagnosis not present

## 2020-09-23 DIAGNOSIS — E538 Deficiency of other specified B group vitamins: Secondary | ICD-10-CM | POA: Diagnosis not present

## 2020-09-23 DIAGNOSIS — M109 Gout, unspecified: Secondary | ICD-10-CM | POA: Diagnosis not present

## 2020-09-23 DIAGNOSIS — Z789 Other specified health status: Secondary | ICD-10-CM | POA: Diagnosis not present

## 2020-09-23 DIAGNOSIS — D869 Sarcoidosis, unspecified: Secondary | ICD-10-CM | POA: Diagnosis not present

## 2020-09-23 DIAGNOSIS — Z1329 Encounter for screening for other suspected endocrine disorder: Secondary | ICD-10-CM | POA: Diagnosis not present

## 2020-09-23 DIAGNOSIS — G4733 Obstructive sleep apnea (adult) (pediatric): Secondary | ICD-10-CM | POA: Diagnosis not present

## 2020-10-04 DIAGNOSIS — M25551 Pain in right hip: Secondary | ICD-10-CM | POA: Diagnosis not present

## 2020-10-04 DIAGNOSIS — M1611 Unilateral primary osteoarthritis, right hip: Secondary | ICD-10-CM | POA: Diagnosis not present

## 2020-10-06 DIAGNOSIS — M9903 Segmental and somatic dysfunction of lumbar region: Secondary | ICD-10-CM | POA: Diagnosis not present

## 2020-10-11 DIAGNOSIS — M9903 Segmental and somatic dysfunction of lumbar region: Secondary | ICD-10-CM | POA: Diagnosis not present

## 2020-10-26 DIAGNOSIS — M9903 Segmental and somatic dysfunction of lumbar region: Secondary | ICD-10-CM | POA: Diagnosis not present

## 2020-11-08 DIAGNOSIS — D869 Sarcoidosis, unspecified: Secondary | ICD-10-CM | POA: Diagnosis not present

## 2020-11-08 DIAGNOSIS — T7840XA Allergy, unspecified, initial encounter: Secondary | ICD-10-CM | POA: Diagnosis not present

## 2020-11-08 DIAGNOSIS — G4733 Obstructive sleep apnea (adult) (pediatric): Secondary | ICD-10-CM | POA: Diagnosis not present

## 2020-11-08 DIAGNOSIS — I639 Cerebral infarction, unspecified: Secondary | ICD-10-CM | POA: Diagnosis not present

## 2020-11-10 DIAGNOSIS — M1A09X0 Idiopathic chronic gout, multiple sites, without tophus (tophi): Secondary | ICD-10-CM | POA: Diagnosis not present

## 2020-11-10 DIAGNOSIS — M170 Bilateral primary osteoarthritis of knee: Secondary | ICD-10-CM | POA: Diagnosis not present

## 2020-11-10 DIAGNOSIS — R200 Anesthesia of skin: Secondary | ICD-10-CM | POA: Diagnosis not present

## 2020-11-10 DIAGNOSIS — M79671 Pain in right foot: Secondary | ICD-10-CM | POA: Diagnosis not present

## 2020-12-21 ENCOUNTER — Other Ambulatory Visit: Payer: Self-pay | Admitting: Family Medicine

## 2020-12-21 NOTE — Telephone Encounter (Signed)
LAST APPOINTMENT DATE: 03/23/2020   NEXT APPOINTMENT DATE: 03/24/2021    LAST REFILL:05/03/2020

## 2021-03-24 ENCOUNTER — Encounter: Payer: Self-pay | Admitting: Family Medicine

## 2021-03-24 ENCOUNTER — Ambulatory Visit (INDEPENDENT_AMBULATORY_CARE_PROVIDER_SITE_OTHER): Payer: 59 | Admitting: Family Medicine

## 2021-03-24 ENCOUNTER — Other Ambulatory Visit: Payer: Self-pay

## 2021-03-24 VITALS — BP 135/82 | HR 63 | Temp 97.2°F | Ht 71.25 in | Wt 228.2 lb

## 2021-03-24 DIAGNOSIS — R7989 Other specified abnormal findings of blood chemistry: Secondary | ICD-10-CM | POA: Insufficient documentation

## 2021-03-24 DIAGNOSIS — N529 Male erectile dysfunction, unspecified: Secondary | ICD-10-CM

## 2021-03-24 DIAGNOSIS — D860 Sarcoidosis of lung: Secondary | ICD-10-CM

## 2021-03-24 DIAGNOSIS — M1A09X0 Idiopathic chronic gout, multiple sites, without tophus (tophi): Secondary | ICD-10-CM | POA: Diagnosis not present

## 2021-03-24 DIAGNOSIS — M545 Low back pain, unspecified: Secondary | ICD-10-CM | POA: Insufficient documentation

## 2021-03-24 DIAGNOSIS — E785 Hyperlipidemia, unspecified: Secondary | ICD-10-CM

## 2021-03-24 DIAGNOSIS — Z125 Encounter for screening for malignant neoplasm of prostate: Secondary | ICD-10-CM

## 2021-03-24 DIAGNOSIS — Z0001 Encounter for general adult medical examination with abnormal findings: Secondary | ICD-10-CM

## 2021-03-24 DIAGNOSIS — G8929 Other chronic pain: Secondary | ICD-10-CM

## 2021-03-24 DIAGNOSIS — R739 Hyperglycemia, unspecified: Secondary | ICD-10-CM

## 2021-03-24 DIAGNOSIS — G4700 Insomnia, unspecified: Secondary | ICD-10-CM

## 2021-03-24 NOTE — Assessment & Plan Note (Signed)
Check uric acid level.  He is on Uloric per rheumatology.

## 2021-03-24 NOTE — Patient Instructions (Signed)
It was very nice to see you today!  I will order your nebulizer machine.  We will check blood work.  Please let me know if you like referral to see a physical therapist or sports medicine doctor for your back.  I will see you back in year.  Come back to see me sooner if needed.  Take care, Dr Jerline Pain  PLEASE NOTE:  If you had any lab tests please let us know if you have not heard back within a few days. You may see your results on mychart before we have a chance to review them but we will give you a call once they are reviewed by [place]. If we ordered any referrals today, please let us know if you have not heard from their office within the next week.   Please try these tips to maintain a healthy lifestyle:   Eat at least 3 REAL meals and 1-2 snacks per day.  Aim for no more than 5 hours between eating.  If you eat breakfast, please do so within one hour of getting up.    Each meal should contain half fruits/vegetables, one quarter protein, and one quarter carbs (no bigger than a computer mouse)   Cut down on sweet beverages. This includes juice, soda, and sweet tea.     Drink at least 1 glass of water with each meal and aim for at least 8 glasses per day   Exercise at least 150 minutes every week.    Preventive Care 64-71 Years Old, Male Preventive care refers to lifestyle choices and visits with your health care provider that can promote health and wellness. This includes:  A yearly physical exam. This is also called an annual wellness visit.  Regular dental and eye exams.  Immunizations.  Screening for certain conditions.  Healthy lifestyle choices, such as: ? Eating a healthy diet. ? Getting regular exercise. ? Not using drugs or products that contain nicotine and tobacco. ? Limiting alcohol use. What can I expect for my preventive care visit? Physical exam Your health care provider will check your:  Height and weight. These may be used to calculate your BMI  (body mass index). BMI is a measurement that tells if you are at a healthy weight.  Heart rate and blood pressure.  Body temperature.  Skin for abnormal spots. Counseling Your health care provider may ask you questions about your:  Past medical problems.  Family's medical history.  Alcohol, tobacco, and drug use.  Emotional well-being.  Home life and relationship well-being.  Sexual activity.  Diet, exercise, and sleep habits.  Work and work Statistician.  Access to firearms. What immunizations do I need? Vaccines are usually given at various ages, according to a schedule. Your health care provider will recommend vaccines for you based on your age, medical history, and lifestyle or other factors, such as travel or where you work.   What tests do I need? Blood tests  Lipid and cholesterol levels. These may be checked every 5 years, or more often if you are over 55 years old.  Hepatitis C test.  Hepatitis B test. Screening  Lung cancer screening. You may have this screening every year starting at age 103 if you have a 30-pack-year history of smoking and currently smoke or have quit within the past 15 years.  Prostate cancer screening. Recommendations will vary depending on your family history and other risks.  Genital exam to check for testicular cancer or hernias.  Colorectal cancer screening. ?  All adults should have this screening starting at age 27 and continuing until age 13. ? Your health care provider may recommend screening at age 47 if you are at increased risk. ? You will have tests every 1-10 years, depending on your results and the type of screening test.  Diabetes screening. ? This is done by checking your blood sugar (glucose) after you have not eaten for a while (fasting). ? You may have this done every 1-3 years.  STD (sexually transmitted disease) testing, if you are at risk. Follow these instructions at home: Eating and drinking  Eat a diet that  includes fresh fruits and vegetables, whole grains, lean protein, and low-fat dairy products.  Take vitamin and mineral supplements as recommended by your health care provider.  Do not drink alcohol if your health care provider tells you not to drink.  If you drink alcohol: ? Limit how much you have to 0-2 drinks a day. ? Be aware of how much alcohol is in your drink. In the [place] one drink equals one 12 oz bottle of beer (355 mL), one 5 oz glass of wine (148 mL), or one 1 oz glass of hard liquor (44 mL).   Lifestyle  Take daily care of your teeth and gums. Brush your teeth every morning and night with fluoride toothpaste. Floss one time each day.  Stay active. Exercise for at least 30 minutes 5 or more days each week.  Do not use any products that contain nicotine or tobacco, such as cigarettes, e-cigarettes, and chewing tobacco. If you need help quitting, ask your health care provider.  Do not use drugs.  If you are sexually active, practice safe sex. Use a condom or other form of protection to prevent STIs (sexually transmitted infections).  If told by your health care provider, take low-dose aspirin daily starting at age 68.  Find healthy ways to cope with stress, such as: ? Meditation, yoga, or listening to music. ? Journaling. ? Talking to a trusted person. ? Spending time with friends and family. Safety  Always wear your seat belt while driving or riding in a vehicle.  Do not drive: ? If you have been drinking alcohol. Do not ride with someone who has been drinking. ? When you are tired or distracted. ? While texting.  Wear a helmet and other protective equipment during sports activities.  If you have firearms in your house, make sure you follow all gun safety procedures. What's next?  Go to your health care provider once a year for an annual wellness visit.  Ask your health care provider how often you should have your eyes and teeth checked.  Stay up to date on  all vaccines. This information is not intended to replace advice given to you by your health care provider. Make sure you discuss any questions you have with your health care provider. Document Revised: 07/07/2019 Document Reviewed: 10/02/2018 Elsevier Patient Education  2021 [employer].

## 2021-03-24 NOTE — Assessment & Plan Note (Signed)
No red flags.  Has seen orthopedics in the past.  Received cortisone shot with minimal improvement.  Is also work with chiropractor with no improvement.  Discussed he may need an MRI given that symptoms have been going on for over a year.  We will continue with watchful waiting for now.  He will let me know if he would like referral to see sports medicine and/for physical therapy.

## 2021-03-24 NOTE — Assessment & Plan Note (Signed)
Found to be low earlier this year.  He is interested in possible replacement.  We will repeat labs today.  If persistently low will consider referral to urology for replacement.

## 2021-03-24 NOTE — Assessment & Plan Note (Signed)
Stable on Ambien as needed.

## 2021-03-24 NOTE — Assessment & Plan Note (Signed)
Check lipids.  He may consider starting fish oils.  Not a candidate for statins.

## 2021-03-24 NOTE — Assessment & Plan Note (Signed)
Uses albuterol as needed 

## 2021-03-24 NOTE — Assessment & Plan Note (Signed)
Stable on Viagra as needed.

## 2021-03-24 NOTE — Progress Notes (Signed)
Chief Complaint:  Jason Weaver is a 65 y.o. male who presents today for his annual comprehensive physical exam.    Assessment/Plan:  Chronic Problems Addressed Today: Gout Check uric acid level.  He is on Uloric per rheumatology.  Dyslipidemia with statin intolerance Check lipids.  He may consider starting fish oils.  Not a candidate for statins.  Low testosterone Found to be low earlier this year.  He is interested in possible replacement.  We will repeat labs today.  If persistently low will consider referral to urology for replacement.  Chronic low back pain No red flags.  Has seen orthopedics in the past.  Received cortisone shot with minimal improvement.  Is also work with chiropractor with no improvement.  Discussed he may need an MRI given that symptoms have been going on for over a year.  We will continue with watchful waiting for now.  He will let me know if he would like referral to see sports medicine and/for physical therapy.  Erectile dysfunction Stable on Viagra as needed.  Insomnia Stable on Ambien as needed.  Pulmonary sarcoidosis (HCC) Uses albuterol as needed.  Body mass index is 31.6 kg/m. / Obese  BMI Metric Follow Up - 03/24/21 0923      BMI Metric Follow Up-Please document annually   BMI Metric Follow Up Education provided           Preventative Healthcare: Up-to-date on colon cancer screening.  Check labs today.  Patient Counseling(The following topics were reviewed and/or handout was given):  -Nutrition: Stressed importance of moderation in sodium/caffeine intake, saturated fat and cholesterol, caloric balance, sufficient intake of fresh fruits, vegetables, and fiber.  -Stressed the importance of regular exercise.   -Substance Abuse: Discussed cessation/primary prevention of tobacco, alcohol, or other drug use; driving or other dangerous activities under the influence; availability of treatment for abuse.   -Injury prevention: Discussed safety  belts, safety helmets, smoke detector, smoking near bedding or upholstery.   -Sexuality: Discussed sexually transmitted diseases, partner selection, use of condoms, avoidance of unintended pregnancy and contraceptive alternatives.   -Dental health: Discussed importance of regular tooth brushing, flossing, and dental visits.  -Health maintenance and immunizations reviewed. Please refer to Health maintenance section.  Return to care in 1 year for next preventative visit.     Subjective:  HPI:  He has no acute complaints today. See A/p for status of chronic conditions.   Lifestyle Diet: Low carb.  Exercise: Doing more walking.   Depression screen [facility] 2/9 03/24/2021  Decreased Interest 0  Down, Depressed, Hopeless 0  PHQ - 2 Score 0  Altered sleeping 0  Tired, decreased energy 0  Change in appetite 0  Feeling bad or failure about yourself  0  Trouble concentrating 0  Moving slowly or fidgety/restless 0  Suicidal thoughts 0  PHQ-9 Score 0  Difficult doing work/chores Not difficult at all    Health Maintenance Due  Topic Date Due  . TETANUS/TDAP  Never done  . Zoster Vaccines- Shingrix (1 of 2) Never done     ROS: Per HPI, otherwise a complete review of systems was negative.   PMH:  The following were reviewed and entered/updated in epic: Past Medical History:  Diagnosis Date  . Cardiomyopathy, nonischemic ([place])   . Diverticulosis of colon   . ED (erectile dysfunction)   . Gout   . Hearing loss   . Hx of colonic polyps   . Hypercholesteremia 12/05/2018  . Hyperlipidemia   . Nasal  septal deviation   . Obesity   . OSA (obstructive sleep apnea)   . PAF (paroxysmal atrial fibrillation) ([place])   . Pulmonary sarcoidosis ([place])  1997    biopsy proven   Patient Active Problem List   Diagnosis Date Noted  . Chronic low back pain 03/24/2021  . Low testosterone 03/24/2021  . Meniere's disease 03/23/2020  . 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 CVA (cerebrovascular  accident) ([place]) 11/14/2018  . Numbness and tingling of foot 10/20/2018  . Insomnia 10/20/2018  . Erectile dysfunction 10/20/2018  . Anal fissure 10/20/2018  . Cold sore 10/20/2018  . Arthritis of ankle or foot, degenerative, left 04/20/2014  . Allergic rhinitis 05/29/2012  . OSA (obstructive sleep apnea) 06/04/2011  . Pulmonary sarcoidosis ([place]) 01/11/2011  . Gout 11/11/2009  . Diverticulosis of large intestine 11/11/2009  . History of colonic polyps 11/11/2009  . Dyslipidemia with statin intolerance 10/24/2009  . Deafness, left 08/23/2009   Past Surgical History:  Procedure Laterality Date  . CATARACT EXTRACTION    . HERNIA REPAIR  2009  . LUNG BIOPSY  1997  . TEE WITHOUT CARDIOVERSION N/A 11/25/2018   Procedure: TRANSESOPHAGEAL ECHOCARDIOGRAM (TEE);  Surgeon: Nigel Mormon, MD;  Location: [facility] ENDOSCOPY;  Service: Cardiovascular;  Laterality: N/A;  . TONSILLECTOMY  1979    Family History  Problem Relation Age of Onset  . COPD Father        was a smoker  . Arthritis Father   . Asthma Sister   . Cancer Sister   . Alcohol abuse Brother     Medications- reviewed and updated Current Outpatient Medications  Medication Sig Dispense Refill  . albuterol (PROAIR HFA) 108 (90 Base) MCG/ACT inhaler INHALE 2 PUFFS INTO THE LUNGS EVERY 6 (SIX) HOURS AS NEEDED FOR WHEEZING. 8.5 each 4  . Ascorbic Acid (VITAMIN C) 1000 MG tablet Take 1,000 mg by mouth daily.    Marland Kitchen augmented betamethasone dipropionate (DIPROLENE-AF) 0.05 % cream     . Cyanocobalamin (VITAMIN B 12 PO) Take 1 tablet by mouth daily.    . diphenhydrAMINE (BENADRYL) 25 MG tablet Take 25 mg by mouth every 6 (six) hours as needed for allergies.    . Febuxostat 80 MG TABS     . fluticasone (FLONASE) 50 MCG/ACT nasal spray Place 1 spray into both nostrils as needed.    . hydrocortisone (ANUSOL-HC) 2.5 % rectal cream Place 1 application rectally 2 (two) times daily as needed for hemorrhoids (itching).    Marland Kitchen ibuprofen (ADVIL)  200 MG tablet Take 200 mg by mouth as needed.    . sildenafil (VIAGRA) 100 MG tablet TAKE ONE-HALF TO ONE TABLET BY MOUTH DAILY AS NEEDED FOR ERECTILE DYSFUNCTION 5 tablet 10  . valACYclovir (VALTREX) 1000 MG tablet Take 1 g by mouth as directed. Take 1 tablet daily for 3-4 days as needed for flare ups  2  . vitamin E 100 UNIT capsule Take 100 Units by mouth daily.    Marland Kitchen zolpidem (AMBIEN) 10 MG tablet TAKE ONE TABLET BY MOUTH EVERY NIGHT AT BEDTIME AS NEEDED FOR SLEEP 30 tablet 0   No current facility-administered medications for this visit.    Allergies-reviewed and updated Allergies  Allergen Reactions  . Ciprofloxacin Rash  . Statins     Muscle pain and generalized weakness.     Social History   Socioeconomic History  . Marital status: Married    Spouse name: Not on file  . Number of children: 0  . Years  of education: 16  . Highest education level: Bachelor's degree (e.g., BA, AB, BS)  Occupational History  . 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 Smoking status: Passive Smoke Exposure - Never Smoker  . Smokeless tobacco: Never Used  Vaping Use  . Vaping Use: Never used  Substance and Sexual Activity  . Alcohol use: Yes    Alcohol/week: 2.0 standard drinks    Types: 2 Glasses of wine per week  . Drug use: Never  . Sexual activity: Yes  Other Topics Concern  . Not on file  Social History Narrative   Lives with wife in a one story home.  No children.  Works for [employer].  Education: college.    Social Determinants of Health   Financial Resource Strain: Not on file  Food Insecurity: Not on file  Transportation Needs: Not on file  Physical Activity: Not on file  Stress: Not on file  Social Connections: Not on file        Objective:  Physical Exam: BP 135/82   Pulse 63   Temp (!) 97.2 F (36.2 C)   Ht 5' 11.25" (1.81 m)   Wt 228 lb 3.2 oz (103.5 kg)   SpO2 98%   BMI 31.60 kg/m   Body mass index is 31.6 kg/m. Wt Readings from Last 3  Encounters:  03/24/21 228 lb 3.2 oz (103.5 kg)  03/23/20 224 lb 3.2 oz (101.7 kg)  03/08/20 230 lb (104.3 kg)   Gen: NAD, resting comfortably HEENT: TMs normal bilaterally. OP clear. No thyromegaly noted.  CV: RRR with no murmurs appreciated Pulm: NWOB, CTAB with no crackles, wheezes, or rhonchi GI: Normal bowel sounds present. Soft, Nontender, Nondistended. MSK: no edema, cyanosis, or clubbing noted Skin: warm, dry Neuro: CN2-12 grossly intact. Strength 5/5 in upper and lower extremities. Reflexes symmetric and intact bilaterally.  Psych: Normal affect and thought content     Pollie Poma M. Jerline Pain, MD 03/24/2021 9:23 AM

## 2021-03-27 ENCOUNTER — Other Ambulatory Visit: Payer: Self-pay

## 2021-03-27 ENCOUNTER — Other Ambulatory Visit (INDEPENDENT_AMBULATORY_CARE_PROVIDER_SITE_OTHER): Payer: 59

## 2021-03-27 DIAGNOSIS — Z125 Encounter for screening for malignant neoplasm of prostate: Secondary | ICD-10-CM | POA: Diagnosis not present

## 2021-03-27 DIAGNOSIS — E785 Hyperlipidemia, unspecified: Secondary | ICD-10-CM

## 2021-03-27 DIAGNOSIS — R7989 Other specified abnormal findings of blood chemistry: Secondary | ICD-10-CM | POA: Diagnosis not present

## 2021-03-27 DIAGNOSIS — Z0001 Encounter for general adult medical examination with abnormal findings: Secondary | ICD-10-CM | POA: Diagnosis not present

## 2021-03-27 DIAGNOSIS — R739 Hyperglycemia, unspecified: Secondary | ICD-10-CM

## 2021-03-27 DIAGNOSIS — M1A09X0 Idiopathic chronic gout, multiple sites, without tophus (tophi): Secondary | ICD-10-CM | POA: Diagnosis not present

## 2021-03-27 LAB — COMPREHENSIVE METABOLIC PANEL
ALT: 20 U/L (ref 0–53)
AST: 18 U/L (ref 0–37)
Albumin: 4.2 g/dL (ref 3.5–5.2)
Alkaline Phosphatase: 75 U/L (ref 39–117)
BUN: 18 mg/dL (ref 6–23)
CO2: 23 mEq/L (ref 19–32)
Calcium: 9.5 mg/dL (ref 8.4–10.5)
Chloride: 105 mEq/L (ref 96–112)
Creatinine, Ser: 0.93 mg/dL (ref 0.40–1.50)
GFR: 87.63 mL/min (ref >60.00)
Glucose, Bld: 94 mg/dL (ref 70–99)
Potassium: 4.5 mEq/L (ref 3.5–5.1)
Sodium: 138 mEq/L (ref 135–145)
Total Bilirubin: 0.8 mg/dL (ref 0.2–1.2)
Total Protein: 6.8 g/dL (ref 6.0–8.3)

## 2021-03-27 LAB — PSA: PSA: 2.03 ng/mL (ref 0.10–4.00)

## 2021-03-27 LAB — HEMOGLOBIN A1C: Hgb A1c MFr Bld: 5 % (ref 4.6–6.5)

## 2021-03-27 LAB — LIPID PANEL
Cholesterol: 234 mg/dL — ABNORMAL HIGH (ref 0–200)
HDL: 51.3 mg/dL (ref >39.00)
LDL Cholesterol: 145 mg/dL — ABNORMAL HIGH (ref 0–99)
NonHDL: 182.81
Total CHOL/HDL Ratio: 5
Triglycerides: 187 mg/dL — ABNORMAL HIGH (ref 0.0–149.0)
VLDL: 37.4 mg/dL (ref 0.0–40.0)

## 2021-03-27 LAB — URIC ACID: Uric Acid, Serum: 7.9 mg/dL — ABNORMAL HIGH (ref 4.0–7.8)

## 2021-03-27 LAB — CBC
HCT: 45.7 % (ref 39.0–52.0)
Hemoglobin: 16.2 g/dL (ref 13.0–17.0)
MCHC: 35.4 g/dL (ref 30.0–36.0)
MCV: 89.8 fl (ref 78.0–100.0)
Platelets: 237 10*3/uL (ref 150.0–400.0)
RBC: 5.09 Mil/uL (ref 4.22–5.81)
RDW: 14.5 % (ref 11.5–15.5)
WBC: 6.1 10*3/uL (ref 4.0–10.5)

## 2021-03-27 LAB — TESTOSTERONE: Testosterone: 304.6 ng/dL (ref 300.00–890.00)

## 2021-03-27 LAB — TSH: TSH: 4.16 u[IU]/mL (ref 0.35–4.50)

## 2021-03-28 NOTE — Progress Notes (Signed)
Please inform patient of the following:  Testosterone is in the low normal range.  We can refer him to urology to discuss replacement if he is interested. Cholesterol is borderline but stable. Everything else is NORMAl. Do not need to start medications but he should continue working on diet and exercise and we can recheck in a year.  Jason Weaver. Jerline Pain, MD 03/28/2021 2:13 PM

## 2021-03-29 ENCOUNTER — Telehealth: Payer: Self-pay

## 2021-03-29 ENCOUNTER — Other Ambulatory Visit: Payer: Self-pay | Admitting: *Deleted

## 2021-03-29 DIAGNOSIS — E349 Endocrine disorder, unspecified: Secondary | ICD-10-CM

## 2021-03-29 NOTE — Telephone Encounter (Signed)
Patient is returning a call about lab results.  

## 2021-03-29 NOTE — Telephone Encounter (Signed)
See results note. 

## 2022-05-18 LAB — COMPREHENSIVE METABOLIC PANEL
Albumin: 4.7 (ref 3.5–5.0)
Calcium: 10.1 (ref 8.7–10.7)
Globulin: 2.7

## 2022-05-18 LAB — BASIC METABOLIC PANEL
BUN: 18 (ref 4–21)
CO2: 20 (ref 13–22)
Chloride: 101 (ref 99–108)
Creatinine: 1 (ref 0.6–1.3)
Glucose: 92
Potassium: 4.4 mEq/L (ref 3.5–5.1)
Sodium: 138 (ref 137–147)

## 2022-05-18 LAB — VITAMIN B12: Vitamin B-12: 344

## 2022-05-18 LAB — IRON,TIBC AND FERRITIN PANEL
%SAT: 34
Ferritin: 147
Iron: 134
TIBC: 397
UIBC: 263

## 2022-05-18 LAB — CBC AND DIFFERENTIAL
HCT: 48 (ref 41–53)
Hemoglobin: 16.3 (ref 13.5–17.5)
Neutrophils Absolute: 4.8
Platelets: 253 10*3/uL (ref 150–400)
WBC: 7.2

## 2022-05-18 LAB — CBC: RBC: 5.32 — AB (ref 3.87–5.11)

## 2022-05-18 LAB — TESTOSTERONE: Testosterone: 417

## 2022-05-18 LAB — TSH: TSH: 4.28 (ref 0.41–5.90)

## 2022-05-18 LAB — HEPATIC FUNCTION PANEL
ALT: 21 U/L (ref 10–40)
AST: 17 (ref 14–40)
Alkaline Phosphatase: 102 (ref 25–125)
Bilirubin, Total: 0.5

## 2022-05-18 LAB — HEMOGLOBIN A1C: Hemoglobin A1C: 5.3

## 2022-05-18 LAB — VITAMIN D 25 HYDROXY (VIT D DEFICIENCY, FRACTURES): Vit D, 25-Hydroxy: 39.5

## 2022-07-16 ENCOUNTER — Encounter: Payer: Self-pay | Admitting: *Deleted

## 2022-07-19 ENCOUNTER — Ambulatory Visit (INDEPENDENT_AMBULATORY_CARE_PROVIDER_SITE_OTHER): Payer: 59 | Admitting: Family Medicine

## 2022-07-19 ENCOUNTER — Encounter: Payer: Self-pay | Admitting: Family Medicine

## 2022-07-19 VITALS — BP 132/85 | HR 69 | Temp 97.7°F | Ht 71.0 in | Wt 230.2 lb

## 2022-07-19 DIAGNOSIS — N529 Male erectile dysfunction, unspecified: Secondary | ICD-10-CM

## 2022-07-19 DIAGNOSIS — Z0001 Encounter for general adult medical examination with abnormal findings: Secondary | ICD-10-CM

## 2022-07-19 DIAGNOSIS — G4700 Insomnia, unspecified: Secondary | ICD-10-CM | POA: Diagnosis not present

## 2022-07-19 DIAGNOSIS — E785 Hyperlipidemia, unspecified: Secondary | ICD-10-CM

## 2022-07-19 DIAGNOSIS — Z125 Encounter for screening for malignant neoplasm of prostate: Secondary | ICD-10-CM

## 2022-07-19 DIAGNOSIS — R7989 Other specified abnormal findings of blood chemistry: Secondary | ICD-10-CM

## 2022-07-19 DIAGNOSIS — M1990 Unspecified osteoarthritis, unspecified site: Secondary | ICD-10-CM

## 2022-07-19 LAB — TSH: TSH: 3.85 u[IU]/mL (ref 0.35–5.50)

## 2022-07-19 LAB — COMPREHENSIVE METABOLIC PANEL
ALT: 19 U/L (ref 0–53)
AST: 13 U/L (ref 0–37)
Albumin: 4.3 g/dL (ref 3.5–5.2)
Alkaline Phosphatase: 74 U/L (ref 39–117)
BUN: 17 mg/dL (ref 6–23)
CO2: 29 mEq/L (ref 19–32)
Calcium: 10.5 mg/dL (ref 8.4–10.5)
Chloride: 103 mEq/L (ref 96–112)
Creatinine, Ser: 1.06 mg/dL (ref 0.40–1.50)
GFR: 74.21 mL/min (ref >60.00)
Glucose, Bld: 104 mg/dL — ABNORMAL HIGH (ref 70–99)
Potassium: 4.5 mEq/L (ref 3.5–5.1)
Sodium: 140 mEq/L (ref 135–145)
Total Bilirubin: 0.6 mg/dL (ref 0.2–1.2)
Total Protein: 7 g/dL (ref 6.0–8.3)

## 2022-07-19 LAB — CBC
HCT: 44.7 % (ref 39.0–52.0)
Hemoglobin: 15.8 g/dL (ref 13.0–17.0)
MCHC: 35.4 g/dL (ref 30.0–36.0)
MCV: 90.5 fl (ref 78.0–100.0)
Platelets: 273 10*3/uL (ref 150.0–400.0)
RBC: 4.94 Mil/uL (ref 4.22–5.81)
RDW: 13.9 % (ref 11.5–15.5)
WBC: 6.2 10*3/uL (ref 4.0–10.5)

## 2022-07-19 LAB — PSA: PSA: 2.02 ng/mL (ref 0.10–4.00)

## 2022-07-19 MED ORDER — GABAPENTIN 300 MG PO CAPS: 300.0000 mg | ORAL_CAPSULE | Freq: Every day | ORAL | 3 refills | Status: DC

## 2022-07-19 MED ORDER — SILDENAFIL CITRATE 100 MG PO TABS: ORAL_TABLET | ORAL | 10 refills | Status: DC

## 2022-07-19 NOTE — Patient Instructions (Addendum)
It was very nice to see you today!  Please try the gabapentin at night to help you with sleep.  This should help with your pain some as well.  You can try taking Aleve or Advil over-the-counter.  This okay for you to do this daily for a week or more at a time.  We will check blood work today.  Come back in 1 year for your next physical.  Come back sooner if needed.   Take care, Dr Jerline Pain  PLEASE NOTE:  If you had any lab tests please let us know if you have not heard back within a few days. You may see your results on mychart before we have a chance to review them but we will give you a call once they are reviewed by [place]. If we ordered any referrals today, please let us know if you have not heard from their office within the next week.   Please try these tips to maintain a healthy lifestyle:  Eat at least 3 REAL meals and 1-2 snacks per day.  Aim for no more than 5 hours between eating.  If you eat breakfast, please do so within one hour of getting up.   Each meal should contain half fruits/vegetables, one quarter protein, and one quarter carbs (no bigger than a computer mouse)  Cut down on sweet beverages. This includes juice, soda, and sweet tea.   Drink at least 1 glass of water with each meal and aim for at least 8 glasses per day  Exercise at least 150 minutes every week.    Preventive Care 1-66 Years Old, Male Preventive care refers to lifestyle choices and visits with your health care provider that can promote health and wellness. Preventive care visits are also called wellness exams. What can I expect for my preventive care visit? Counseling During your preventive care visit, your health care provider may ask about your: Medical history, including: Past medical problems. Family medical history. Current health, including: Emotional well-being. Home life and relationship well-being. Sexual activity. Lifestyle, including: Alcohol, nicotine or tobacco, and drug  use. Access to firearms. Diet, exercise, and sleep habits. Safety issues such as seatbelt and bike helmet use. Sunscreen use. Work and work Statistician. Physical exam Your health care provider will check your: Height and weight. These may be used to calculate your BMI (body mass index). BMI is a measurement that tells if you are at a healthy weight. Waist circumference. This measures the distance around your waistline. This measurement also tells if you are at a healthy weight and may help predict your risk of certain diseases, such as type 2 diabetes and high blood pressure. Heart rate and blood pressure. Body temperature. Skin for abnormal spots. What immunizations do I need?  Vaccines are usually given at various ages, according to a schedule. Your health care provider will recommend vaccines for you based on your age, medical history, and lifestyle or other factors, such as travel or where you work. What tests do I need? Screening Your health care provider may recommend screening tests for certain conditions. This may include: Lipid and cholesterol levels. Diabetes screening. This is done by checking your blood sugar (glucose) after you have not eaten for a while (fasting). Hepatitis B test. Hepatitis C test. HIV (human immunodeficiency virus) test. STI (sexually transmitted infection) testing, if you are at risk. Lung cancer screening. Prostate cancer screening. Colorectal cancer screening. Talk with your health care provider about your test results, treatment options, and if necessary,  the need for more tests. Follow these instructions at home: Eating and drinking  Eat a diet that includes fresh fruits and vegetables, whole grains, lean protein, and low-fat dairy products. Take vitamin and mineral supplements as recommended by your health care provider. Do not drink alcohol if your health care provider tells you not to drink. If you drink alcohol: Limit how much you have to  0-2 drinks a day. Know how much alcohol is in your drink. In the [place] one drink equals one 12 oz bottle of beer (355 mL), one 5 oz glass of wine (148 mL), or one 1 oz glass of hard liquor (44 mL). Lifestyle Brush your teeth every morning and night with fluoride toothpaste. Floss one time each day. Exercise for at least 30 minutes 5 or more days each week. Do not use any products that contain nicotine or tobacco. These products include cigarettes, chewing tobacco, and vaping devices, such as e-cigarettes. If you need help quitting, ask your health care provider. Do not use drugs. If you are sexually active, practice safe sex. Use a condom or other form of protection to prevent STIs. Take aspirin only as told by your health care provider. Make sure that you understand how much to take and what form to take. Work with your health care provider to find out whether it is safe and beneficial for you to take aspirin daily. Find healthy ways to manage stress, such as: Meditation, yoga, or listening to music. Journaling. Talking to a trusted person. Spending time with friends and family. Minimize exposure to UV radiation to reduce your risk of skin cancer. Safety Always wear your seat belt while driving or riding in a vehicle. Do not drive: If you have been drinking alcohol. Do not ride with someone who has been drinking. When you are tired or distracted. While texting. If you have been using any mind-altering substances or drugs. Wear a helmet and other protective equipment during sports activities. If you have firearms in your house, make sure you follow all gun safety procedures. What's next? Go to your health care provider once a year for an annual wellness visit. Ask your health care provider how often you should have your eyes and teeth checked. Stay up to date on all vaccines. This information is not intended to replace advice given to you by your health care provider. Make sure you  discuss any questions you have with your health care provider. Document Revised: 04/05/2021 Document Reviewed: 04/05/2021 Elsevier Patient Education  [place].

## 2022-07-19 NOTE — Assessment & Plan Note (Signed)
Follows with [facility].  Currently on sublingual testosterone.  He is not sure of this latest testosterone level however they would like for [place] to check PSA today.  We will order this lab.

## 2022-07-19 NOTE — Assessment & Plan Note (Signed)
Stable on Viagra as needed.  Will refill today.

## 2022-07-19 NOTE — Assessment & Plan Note (Signed)
Not controlled.  Cannot tolerate Ambien due to next-day side effects.  Benadryl will occasionally help.  We discussed potential alternatives.  We will try gabapentin 300 mg nightly.  Hopefully this will help some with his arthritis pain as well.  He will follow-up with me in a few weeks via MyChart.  May consider trial of trazodone or amitriptyline in the future depending on response to gabapentin.

## 2022-07-19 NOTE — Assessment & Plan Note (Signed)
Had lengthy discussion with patient regarding his arthritis and joint pain.  This is causing significant mount of pain and impairing his activities of daily living.  He is following with orthopedics and physical therapy.  May be undergoing hip replacement surgery soon however is not ready to commit to this at this point.  He can continue using over-the-counter meds.  Discussed with patient it is okay for him to take Aleve twice daily for 1 to 2 weeks at a time.  Will defer further management to orthopedics.

## 2022-07-19 NOTE — Progress Notes (Signed)
Please inform patient of the following:  PSA is stable compared to last year.  Everything else is normal.  Do not need to make any changes to treatment plan at this time.  We can recheck in a year.

## 2022-07-19 NOTE — Assessment & Plan Note (Signed)
Recently had cholesterol done with [facility].  He will get [place] these records.

## 2022-07-19 NOTE — Progress Notes (Signed)
Chief Complaint:  Jason Weaver is a 66 y.o. male who presents today for his annual comprehensive physical exam.    Assessment/Plan:  Chronic Problems Addressed Today: Osteoarthritis Had lengthy discussion with patient regarding his arthritis and joint pain.  This is causing significant mount of pain and impairing his activities of daily living.  He is following with orthopedics and physical therapy.  May be undergoing hip replacement surgery soon however is not ready to commit to this at this point.  He can continue using over-the-counter meds.  Discussed with patient it is okay for him to take Aleve twice daily for 1 to 2 weeks at a time.  Will defer further management to orthopedics.   Low testosterone Follows with [facility].  Currently on sublingual testosterone.  He is not sure of this latest testosterone level however they would like for [place] to check PSA today.  We will order this lab.  Dyslipidemia with statin intolerance Recently had cholesterol done with [facility].  He will get [place] these records.  Erectile dysfunction Stable on Viagra as needed.  Will refill today.  Insomnia Not controlled.  Cannot tolerate Ambien due to next-day side effects.  Benadryl will occasionally help.  We discussed potential alternatives.  We will try gabapentin 300 mg nightly.  Hopefully this will help some with his arthritis pain as well.  He will follow-up with me in a few weeks via MyChart.  May consider trial of trazodone or amitriptyline in the future depending on response to gabapentin.  Preventative Healthcare: Recently had labs done with [facility].  Up-to-date on colon cancer screening.  Vaccines deferred.  Patient Counseling(The following topics were reviewed and/or handout was given):  -Nutrition: Stressed importance of moderation in sodium/caffeine intake, saturated fat and cholesterol, caloric balance, sufficient intake of fresh fruits, vegetables, and  fiber.  -Stressed the importance of regular exercise.   -Substance Abuse: Discussed cessation/primary prevention of tobacco, alcohol, or other drug use; driving or other dangerous activities under the influence; availability of treatment for abuse.   -Injury prevention: Discussed safety belts, safety helmets, smoke detector, smoking near bedding or upholstery.   -Sexuality: Discussed sexually transmitted diseases, partner selection, use of condoms, avoidance of unintended pregnancy and contraceptive alternatives.   -Dental health: Discussed importance of regular tooth brushing, flossing, and dental visits.  -Health maintenance and immunizations reviewed. Please refer to Health maintenance section.  Return to care in 1 year for next preventative visit.     Subjective:  HPI:  He has no acute complaints today.  See A/P for status of chronic conditions.  Lifestyle Diet: Balanced.  Exercise: Limited due to back pain.      07/19/2022    7:53 AM  Depression screen PHQ 2/9  Decreased Interest 0  Down, Depressed, Hopeless 0  PHQ - 2 Score 0    ROS: Per HPI, otherwise a complete review of systems was negative.   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   Patient  Active Problem List   Diagnosis Date Noted   Chronic low back pain 03/24/2021   Low testosterone 03/24/2021   Meniere's disease 03/23/2020   Acquired nasal septal defect 10/06/2019   CVA (cerebrovascular accident) ([place]) 11/14/2018   Numbness and tingling of foot 10/20/2018   Insomnia 10/20/2018    Erectile dysfunction 10/20/2018   Anal fissure 10/20/2018   Cold sore 10/20/2018   Osteoarthritis 04/20/2014   Allergic rhinitis 05/29/2012   OSA (obstructive sleep apnea) 06/04/2011   Pulmonary sarcoidosis ([place]) 01/11/2011   Gout 11/11/2009   Diverticulosis of large intestine 11/11/2009   History of colonic polyps 11/11/2009   Dyslipidemia with statin intolerance 10/24/2009   Deafness, left 08/23/2009   Past Surgical History:  Procedure Laterality Date   CATARACT EXTRACTION     HERNIA REPAIR  2009   LUNG BIOPSY  1997   TEE WITHOUT CARDIOVERSION N/A 11/25/2018   Procedure: TRANSESOPHAGEAL ECHOCARDIOGRAM (TEE);  Surgeon: Nigel Mormon, MD;  Location[facility] ENDOSCOPY;  Service: Cardiovascular;  Laterality: N/A;   TONSILLECTOMY  1979    Family History  Problem Relation Age of Onset   COPD Father        was a smoker   Arthritis Father    Asthma Sister    Cancer Sister    Alcohol abuse Brother     Medications- reviewed and updated Current Outpatient Medications  Medication Sig Dispense Refill   albuterol (PROAIR HFA) 108 (90 Base) MCG/ACT inhaler INHALE 2 PUFFS INTO THE LUNGS EVERY 6 (SIX) HOURS AS NEEDED FOR WHEEZING. 8.5 each 4   Ascorbic Acid (VITAMIN C) 1000 MG tablet Take 1,000 mg by mouth daily.     augmented betamethasone dipropionate (DIPROLENE-AF) 0.05 % cream      Cyanocobalamin (VITAMIN B 12 PO) Take 1 tablet by mouth daily.     diphenhydrAMINE (BENADRYL) 25 MG tablet Take 25 mg by mouth every 6 (six) hours as needed for allergies.     Febuxostat 80 MG TABS      fluticasone (FLONASE) 50 MCG/ACT nasal spray Place 1 spray into both nostrils as needed.     gabapentin (NEURONTIN) 300 MG capsule Take 1 capsule (300 mg total) by mouth at bedtime. 90 capsule 3   hydrocortisone (ANUSOL-HC) 2.5 % rectal cream Place 1 application rectally 2 (two) times daily as needed for hemorrhoids (itching).     ibuprofen (ADVIL) 200 MG tablet Take 200 mg by mouth as needed.      valACYclovir (VALTREX) 1000 MG tablet Take 1 g by mouth as directed. Take 1 tablet daily for 3-4 days as needed for flare ups  2   vitamin E 100 UNIT capsule Take 100 Units by mouth daily.     zolpidem (AMBIEN) 10 MG tablet TAKE ONE TABLET BY MOUTH EVERY NIGHT AT BEDTIME AS NEEDED FOR SLEEP 30 tablet 0   sildenafil (VIAGRA) 100 MG tablet TAKE ONE-HALF TO ONE TABLET BY MOUTH DAILY AS NEEDED FOR ERECTILE DYSFUNCTION 5 tablet 10   No current facility-administered medications for this visit.    Allergies-reviewed and updated Allergies  Allergen Reactions   Ciprofloxacin Rash   Statins     Muscle pain and generalized weakness.     Social History   Socioeconomic History   Marital status: Married    Spouse name: Not on file   Number of children: 0   Years of education: 16   Highest education level: Bachelor's degree (e.g., BA, AB, BS)  Occupational History   Occupation: Government social research officer  Employer: [employer]  Tobacco Use   Smoking status: Passive Smoke Exposure - Never Smoker   Smokeless tobacco: Never  Vaping Use   Vaping Use: Never used  Substance and Sexual Activity   Alcohol use: Yes    Alcohol/week: 2.0 standard drinks of alcohol    Types: 2 Glasses of wine per week   Drug use: Never   Sexual activity: Yes  Other Topics Concern   Not on file  Social History Narrative   Lives with wife in a one story home.  No children.  Works for [employer].  Education: college.    Social Determinants of Health   Financial Resource Strain: Not on file  Food Insecurity: Not on file  Transportation Needs: Not on file  Physical Activity: Not on file  Stress: Not on file  Social Connections: Not on file        Objective:  Physical Exam: BP 132/85   Pulse 69   Temp 97.7 F (36.5 C) (Temporal)   Ht '5\' 11"'$  (1.803 m)   Wt 230 lb 3.2 oz (104.4 kg)   SpO2 98%   BMI 32.11 kg/m   Body mass index is 32.11 kg/m. Wt Readings from Last 3 Encounters:  07/19/22 230 lb 3.2 oz (104.4 kg)   03/24/21 228 lb 3.2 oz (103.5 kg)  03/23/20 224 lb 3.2 oz (101.7 kg)   Gen: NAD, resting comfortably HEENT: TMs normal bilaterally. OP clear. No thyromegaly noted.  CV: RRR with no murmurs appreciated Pulm: NWOB, CTAB with no crackles, wheezes, or rhonchi GI: Normal bowel sounds present. Soft, Nontender, Nondistended. MSK: no edema, cyanosis, or clubbing noted Skin: warm, dry Neuro: CN2-12 grossly intact. Strength 5/5 in upper and lower extremities. Reflexes symmetric and intact bilaterally.  Psych: Normal affect and thought content     Jason Weaver M. Jerline Pain, MD 07/19/2022 8:26 AM

## 2022-08-13 ENCOUNTER — Telehealth: Payer: Self-pay | Admitting: Family Medicine

## 2022-08-13 NOTE — Telephone Encounter (Signed)
Pt states: -attempting to schedule hip surgery with [facility] office if sending forms by fax. -Follow up information will be on fax.   Pt declined ov

## 2022-08-14 ENCOUNTER — Telehealth: Payer: Self-pay | Admitting: Family Medicine

## 2022-08-14 NOTE — Telephone Encounter (Signed)
..  Type of form received: sx clearance form   Additional comments:   Received by: Raliegh Ip  Form should be Faxed KB:[phone]  Form should be mailed to:  na  Is patient requesting call for pickup:   Form placed:  na   Attach charge sheet.  Yes   Individual made aware of 3-5 business day turn around (Y/N)?   Na

## 2022-08-15 NOTE — Telephone Encounter (Signed)
Patient has OV on 07/27/2022

## 2022-08-15 NOTE — Telephone Encounter (Signed)
Patient is scheduled for appointment on 08/16/22

## 2022-08-15 NOTE — Telephone Encounter (Signed)
Patient need OV for clearance, please schedule  Form placed wall basket

## 2022-08-16 ENCOUNTER — Ambulatory Visit (INDEPENDENT_AMBULATORY_CARE_PROVIDER_SITE_OTHER): Payer: 59 | Admitting: Family Medicine

## 2022-08-16 VITALS — BP 151/90 | HR 74 | Temp 98.4°F

## 2022-08-16 DIAGNOSIS — I63412 Cerebral infarction due to embolism of left middle cerebral artery: Secondary | ICD-10-CM

## 2022-08-16 DIAGNOSIS — M1990 Unspecified osteoarthritis, unspecified site: Secondary | ICD-10-CM | POA: Diagnosis not present

## 2022-08-16 DIAGNOSIS — Z01818 Encounter for other preprocedural examination: Secondary | ICD-10-CM

## 2022-08-16 DIAGNOSIS — D860 Sarcoidosis of lung: Secondary | ICD-10-CM | POA: Diagnosis not present

## 2022-08-16 NOTE — Assessment & Plan Note (Signed)
Symptoms are overall stable.  No cough, wheeze, or shortness of breath.  He has used albuterol in the past but has not needed it for a while.

## 2022-08-16 NOTE — Progress Notes (Signed)
Jason Weaver is a 66 y.o. male who presents today for an office visit.  Assessment/Plan:  New/Acute Problems: Encounter for surgical clearance Patient will be getting labs done through orthopedics office.  EKG today shows NSR with ischemic  changes.  He has good functional status and is overall low risk for cardiac complications from surgery.  He will be cleared from a cardiac and medical standpoint.  Chronic Problems Addressed Today: Osteoarthritis Pain is not controlled.  Follows with orthopedics.  Will be undergoing right hip arthroplasty as above soon.  CVA (cerebrovascular accident) ([place]) No residual deficits.  Cannot tolerate statins due to myalgias.  Pulmonary sarcoidosis ([place]) Symptoms are overall stable.  No cough, wheeze, or shortness of breath.  He has used albuterol in the past but has not needed it for a while.     Subjective:  HPI:  Patient here for surgical clearance.  Will be undergoing right hip arthroplasty with orthopedics soon.  Needs medical and cardiac clearance.  Aside from his hip pain is doing well.  No chest pain or shortness of breath.  No dysuria.  No fevers or chills.  Has good functional status with exception of right hip pain.  He has tried cortisone shots in his hip as well as over-the-counter meds without any improvement.  Pain is significantly worsened last several weeks.  ROS: Per HPI, otherwise a complete review of systems was negative.   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    biopsy proven   Patient Active Problem List    Diagnosis Date Noted   Chronic low back pain 03/24/2021   Low testosterone 03/24/2021   Meniere's disease 03/23/2020   Acquired nasal septal defect 10/06/2019   CVA (cerebrovascular accident) ([place]) 11/14/2018   Numbness and tingling of foot 10/20/2018   Insomnia 10/20/2018   Erectile dysfunction 10/20/2018   Anal fissure 10/20/2018   Cold sore 10/20/2018   Osteoarthritis 04/20/2014   Allergic rhinitis 05/29/2012   OSA (obstructive sleep apnea) 06/04/2011   Pulmonary sarcoidosis ([place]) 01/11/2011   Gout 11/11/2009   Diverticulosis of large intestine 11/11/2009   History of colonic polyps 11/11/2009   Dyslipidemia with statin intolerance 10/24/2009   Deafness, left 08/23/2009   Past Surgical History:  Procedure Laterality Date   CATARACT EXTRACTION     HERNIA REPAIR  2009   LUNG BIOPSY  1997   TEE WITHOUT CARDIOVERSION N/A 11/25/2018   Procedure: TRANSESOPHAGEAL ECHOCARDIOGRAM (TEE);  Surgeon: Nigel Mormon, MD;  Location: [facility] ENDOSCOPY;  Service: Cardiovascular;  Laterality: N/A;   TONSILLECTOMY  1979    Family History  Problem Relation Age of Onset   COPD Father        was a smoker   Arthritis Father    Asthma Sister    Cancer Sister    Alcohol abuse Brother     Medications- reviewed and updated Current Outpatient Medications  Medication Sig Dispense Refill   albuterol (PROAIR HFA) 108 (90 Base) MCG/ACT inhaler INHALE 2 PUFFS INTO THE LUNGS  EVERY 6 (SIX) HOURS AS NEEDED FOR WHEEZING. 8.5 each 4   Ascorbic Acid (VITAMIN C) 1000 MG tablet Take 1,000 mg by mouth daily.     augmented betamethasone dipropionate (DIPROLENE-AF) 0.05 % cream      Cyanocobalamin (VITAMIN B 12 PO) Take 1 tablet by mouth daily.     diphenhydrAMINE (BENADRYL) 25 MG tablet Take 25 mg by mouth every 6 (six) hours as needed for allergies.     Febuxostat 80 MG TABS      fluticasone (FLONASE) 50 MCG/ACT nasal spray Place 1 spray into both nostrils as needed.     gabapentin (NEURONTIN) 300 MG  capsule Take 1 capsule (300 mg total) by mouth at bedtime. 90 capsule 3   hydrocortisone (ANUSOL-HC) 2.5 % rectal cream Place 1 application rectally 2 (two) times daily as needed for hemorrhoids (itching).     ibuprofen (ADVIL) 200 MG tablet Take 200 mg by mouth as needed.     sildenafil (VIAGRA) 100 MG tablet TAKE ONE-HALF TO ONE TABLET BY MOUTH DAILY AS NEEDED FOR ERECTILE DYSFUNCTION 5 tablet 10   valACYclovir (VALTREX) 1000 MG tablet Take 1 g by mouth as directed. Take 1 tablet daily for 3-4 days as needed for flare ups  2   vitamin E 100 UNIT capsule Take 100 Units by mouth daily.     zolpidem (AMBIEN) 10 MG tablet TAKE ONE TABLET BY MOUTH EVERY NIGHT AT BEDTIME AS NEEDED FOR SLEEP 30 tablet 0   No current facility-administered medications for this visit.    Allergies-reviewed and updated Allergies  Allergen Reactions   Ciprofloxacin Rash   Statins     Muscle pain and generalized weakness.     Social History   Socioeconomic History   Marital status: Married    Spouse name: Not on file   Number of children: 0   Years of education: 16   Highest education level: Bachelor's degree (e.g., BA, AB, BS)  Occupational History   Occupation: Herbalist: [employer]  Tobacco Use   Smoking status: Passive Smoke Exposure - Never Smoker   Smokeless tobacco: Never  Vaping Use   Vaping Use: Never used  Substance and Sexual Activity   Alcohol use: Yes    Alcohol/week: 2.0 standard drinks of alcohol    Types: 2 Glasses of wine per week   Drug use: Never   Sexual activity: Yes  Other Topics Concern   Not on file  Social History Narrative   Lives with wife in a one story home.  No children.  Works for [employer].  Education: college.    Social Determinants of Health   Financial Resource Strain: Not on file  Food Insecurity: Not on file  Transportation Needs: Not on file  Physical Activity: Not on file  Stress: Not on file  Social Connections: Not on file            Objective:  Physical Exam: BP (!) 151/90   Pulse 74   Temp 98.4 F (36.9 C) (Temporal)   SpO2 96%   Gen: No acute distress, resting comfortably CV: Regular rate and rhythm with no murmurs appreciated Pulm: Normal work of breathing, clear to auscultation bilaterally with no crackles, wheezes, or rhonchi Neuro: Grossly normal, moves all extremities Psych: Normal affect and thought content  EKG: NSR, no acute ischemic changes      Haile Toppins M. Jerline Pain, MD 08/16/2022 12:45 PM

## 2022-08-16 NOTE — Assessment & Plan Note (Signed)
No residual deficits.  Cannot tolerate statins due to myalgias.

## 2022-08-16 NOTE — Patient Instructions (Addendum)
It was very nice to see you today!  Your EKG today is normal.  We will fax your clearance form to your surgeon's office.  Please let us know if we can be of any other assistance.  Take care, Dr Jerline Pain  PLEASE NOTE:  If you had any lab tests please let us know if you have not heard back within a few days. You may see your results on mychart before we have a chance to review them but we will give you a call once they are reviewed by [place]. If we ordered any referrals today, please let us know if you have not heard from their office within the next week.   Please try these tips to maintain a healthy lifestyle:  Eat at least 3 REAL meals and 1-2 snacks per day.  Aim for no more than 5 hours between eating.  If you eat breakfast, please do so within one hour of getting up.   Each meal should contain half fruits/vegetables, one quarter protein, and one quarter carbs (no bigger than a computer mouse)  Cut down on sweet beverages. This includes juice, soda, and sweet tea.   Drink at least 1 glass of water with each meal and aim for at least 8 glasses per day  Exercise at least 150 minutes every week.

## 2022-08-16 NOTE — Assessment & Plan Note (Signed)
Pain is not controlled.  Follows with orthopedics.  Will be undergoing right hip arthroplasty as above soon.

## 2022-08-17 NOTE — Telephone Encounter (Signed)
Pt states: -Was seen on 08/16/22 by PCP team -Surgery clearance is missing verbiage they need.  Pt requests: -PCP team to amende the surgery clearance form to state the following. "Cleared medically and cardiologically for surgery".   Pt requests a call back if there are any questions.

## 2022-08-29 NOTE — Telephone Encounter (Signed)
See note

## 2022-08-29 NOTE — Telephone Encounter (Signed)
Spoke with Nickola Major at [facility] stated was missing office note.Sable Feil, Lab results and form was re faxed to [phone]  LVM to Patient form was refaxed to day

## 2022-08-29 NOTE — Telephone Encounter (Signed)
Can we clarify? We sent over the form with the box checked for medical and cardiac clearance.  Algis Greenhouse. Jerline Pain, MD 08/29/2022 9:55 AM

## 2022-10-31 ENCOUNTER — Encounter: Payer: Self-pay | Admitting: Family Medicine

## 2023-01-21 DIAGNOSIS — L718 Other rosacea: Secondary | ICD-10-CM | POA: Diagnosis not present

## 2023-01-21 DIAGNOSIS — L218 Other seborrheic dermatitis: Secondary | ICD-10-CM | POA: Diagnosis not present

## 2023-04-23 DIAGNOSIS — M1611 Unilateral primary osteoarthritis, right hip: Secondary | ICD-10-CM | POA: Diagnosis not present

## 2023-05-16 DIAGNOSIS — H4312 Vitreous hemorrhage, left eye: Secondary | ICD-10-CM | POA: Diagnosis not present

## 2023-05-16 DIAGNOSIS — H43813 Vitreous degeneration, bilateral: Secondary | ICD-10-CM | POA: Diagnosis not present

## 2023-05-16 DIAGNOSIS — H33312 Horseshoe tear of retina without detachment, left eye: Secondary | ICD-10-CM | POA: Diagnosis not present

## 2023-05-16 DIAGNOSIS — H31092 Other chorioretinal scars, left eye: Secondary | ICD-10-CM | POA: Diagnosis not present

## 2023-05-16 DIAGNOSIS — H35372 Puckering of macula, left eye: Secondary | ICD-10-CM | POA: Diagnosis not present

## 2023-05-22 DIAGNOSIS — M25551 Pain in right hip: Secondary | ICD-10-CM | POA: Diagnosis not present

## 2023-05-22 DIAGNOSIS — M545 Low back pain, unspecified: Secondary | ICD-10-CM | POA: Diagnosis not present

## 2023-06-13 DIAGNOSIS — H31092 Other chorioretinal scars, left eye: Secondary | ICD-10-CM | POA: Diagnosis not present

## 2023-06-13 DIAGNOSIS — H43813 Vitreous degeneration, bilateral: Secondary | ICD-10-CM | POA: Diagnosis not present

## 2023-06-13 DIAGNOSIS — H4312 Vitreous hemorrhage, left eye: Secondary | ICD-10-CM | POA: Diagnosis not present

## 2023-06-13 DIAGNOSIS — H43391 Other vitreous opacities, right eye: Secondary | ICD-10-CM | POA: Diagnosis not present

## 2023-06-13 DIAGNOSIS — H35372 Puckering of macula, left eye: Secondary | ICD-10-CM | POA: Diagnosis not present

## 2023-08-14 DIAGNOSIS — D125 Benign neoplasm of sigmoid colon: Secondary | ICD-10-CM | POA: Diagnosis not present

## 2023-08-14 DIAGNOSIS — Z09 Encounter for follow-up examination after completed treatment for conditions other than malignant neoplasm: Secondary | ICD-10-CM | POA: Diagnosis not present

## 2023-08-14 DIAGNOSIS — K5730 Diverticulosis of large intestine without perforation or abscess without bleeding: Secondary | ICD-10-CM | POA: Diagnosis not present

## 2023-08-14 DIAGNOSIS — Z860101 Personal history of adenomatous and serrated colon polyps: Secondary | ICD-10-CM | POA: Diagnosis not present

## 2023-08-14 DIAGNOSIS — K648 Other hemorrhoids: Secondary | ICD-10-CM | POA: Diagnosis not present

## 2023-08-14 LAB — HM COLONOSCOPY

## 2023-08-16 DIAGNOSIS — D125 Benign neoplasm of sigmoid colon: Secondary | ICD-10-CM | POA: Diagnosis not present

## 2023-09-17 ENCOUNTER — Encounter: Payer: Self-pay | Admitting: Family Medicine

## 2023-09-17 ENCOUNTER — Ambulatory Visit: Payer: PPO | Admitting: Family Medicine

## 2023-09-17 VITALS — BP 118/77 | HR 61 | Temp 97.5°F | Ht 71.0 in | Wt 224.8 lb

## 2023-09-17 DIAGNOSIS — M1990 Unspecified osteoarthritis, unspecified site: Secondary | ICD-10-CM

## 2023-09-17 DIAGNOSIS — G4700 Insomnia, unspecified: Secondary | ICD-10-CM | POA: Diagnosis not present

## 2023-09-17 DIAGNOSIS — M545 Low back pain, unspecified: Secondary | ICD-10-CM

## 2023-09-17 DIAGNOSIS — G8929 Other chronic pain: Secondary | ICD-10-CM

## 2023-09-17 MED ORDER — ZOLPIDEM TARTRATE 10 MG PO TABS: 5.0000 mg | ORAL_TABLET | Freq: Every evening | ORAL | 0 refills | Status: DC | PRN

## 2023-09-17 NOTE — Assessment & Plan Note (Signed)
Overall symptoms are stable.  Uses Ambien very infrequently as needed.  Last refill was a couple of years ago.  He has tried other medications in the past including hydroxyzine and gabapentin without much improvement.  Will refill his Ambien today.

## 2023-09-17 NOTE — Assessment & Plan Note (Signed)
See osteoarthritis A/P.  Secondary to degenerative changes.  Will be referring to PT and sports medicine.  Orthopedics check plain film recently which did show degenerative changes.

## 2023-09-17 NOTE — Assessment & Plan Note (Signed)
Had lengthy disc patient with patient today regarding treatment for his osteoarthritis and back pain.  He is having modest improvement with ibuprofen.  He would like to avoid prescription medications if possible.  He is agreeable to referral to PT and sports medicine.  Will place referral today.  His orthopedist has obtained imaging in the past-will not obtain any acute imaging at this point though will defer this to sports medicine.

## 2023-09-17 NOTE — Patient Instructions (Signed)
It was very nice to see you today!  I will refer you to physical therapy and sports medicine.  Please continue to work on diet and exercise.  I will refill your Ambien.  Return for Annual Physical.   Take care, Dr Jimmey Ralph  PLEASE NOTE:  If you had any lab tests, please let us know if you have not heard back within a few days. You may see your results on mychart before we have a chance to review them but we will give you a call once they are reviewed by [place].   If we ordered any referrals today, please let us know if you have not heard from their office within the next week.   If you had any urgent prescriptions sent in today, please check with the pharmacy within an hour of our visit to make sure the prescription was transmitted appropriately.   Please try these tips to maintain a healthy lifestyle:  Eat at least 3 REAL meals and 1-2 snacks per day.  Aim for no more than 5 hours between eating.  If you eat breakfast, please do so within one hour of getting up.   Each meal should contain half fruits/vegetables, one quarter protein, and one quarter carbs (no bigger than a computer mouse)  Cut down on sweet beverages. This includes juice, soda, and sweet tea.   Drink at least 1 glass of water with each meal and aim for at least 8 glasses per day  Exercise at least 150 minutes every week.

## 2023-09-17 NOTE — Progress Notes (Signed)
   Jason Weaver is a 67 y.o. male who presents today for an office visit.  Assessment/Plan:  Chronic Problems Addressed Today: Osteoarthritis Had lengthy disc patient with patient today regarding treatment for his osteoarthritis and back pain.  He is having modest improvement with ibuprofen.  He would like to avoid prescription medications if possible.  He is agreeable to referral to PT and sports medicine.  Will place referral today.  His orthopedist has obtained imaging in the past-will not obtain any acute imaging at this point though will defer this to sports medicine.  Insomnia Overall symptoms are stable.  Uses Ambien very infrequently as needed.  Last refill was a couple of years ago.  He has tried other medications in the past including hydroxyzine and gabapentin without much improvement.  Will refill his Ambien today.  Chronic low back pain See osteoarthritis A/P.  Secondary to degenerative changes.  Will be referring to PT and sports medicine.  Orthopedics check plain film recently which did show degenerative changes.     Subjective:  HPI:  See A/P for status of chronic conditions.  Patient is here with back pain.  We last saw him about a year ago for osteoarthritis and right hip pain.  He has been following with orthopedics for right hip pain as well.  Over the last year he still having persistent pain in his back.  He will take over-the-counter anti-inflammatories and symptoms improved however they will quickly return.  Also having pain in both of his shoulders.  Advil usually does help.  He is orthopedics has given him prednisone in the past with significant improvement in his symptoms however he would like to avoid following use of any medication if possible.  As orthopedics has obtained plain films in the past of his back which did show moderate degenerative changes.       Objective:  Physical Exam: BP 118/77   Pulse 61   Temp (!) 97.5 F (36.4 C) (Temporal)   Ht 5\' 11"   (1.803 m)   Wt 224 lb 12.8 oz (102 kg)   SpO2 98%   BMI 31.35 kg/m   Gen: No acute distress, resting comfortably CV: Regular rate and rhythm with no murmurs appreciated Pulm: Normal work of breathing, clear to auscultation bilaterally with no crackles, wheezes, or rhonchi Neuro: Grossly normal, moves all extremities Psych: Normal affect and thought content      Curtina Grills M. Jimmey Ralph, MD 09/17/2023 1:02 PM

## 2023-09-23 ENCOUNTER — Ambulatory Visit (INDEPENDENT_AMBULATORY_CARE_PROVIDER_SITE_OTHER): Payer: PPO

## 2023-09-23 ENCOUNTER — Ambulatory Visit: Payer: PPO | Admitting: Sports Medicine

## 2023-09-23 VITALS — BP 132/80 | HR 87 | Ht 71.0 in | Wt 225.0 lb

## 2023-09-23 DIAGNOSIS — M48061 Spinal stenosis, lumbar region without neurogenic claudication: Secondary | ICD-10-CM | POA: Diagnosis not present

## 2023-09-23 DIAGNOSIS — M25512 Pain in left shoulder: Secondary | ICD-10-CM

## 2023-09-23 DIAGNOSIS — M5135 Other intervertebral disc degeneration, thoracolumbar region: Secondary | ICD-10-CM

## 2023-09-23 DIAGNOSIS — M549 Dorsalgia, unspecified: Secondary | ICD-10-CM | POA: Diagnosis not present

## 2023-09-23 DIAGNOSIS — M545 Low back pain, unspecified: Secondary | ICD-10-CM

## 2023-09-23 DIAGNOSIS — M47816 Spondylosis without myelopathy or radiculopathy, lumbar region: Secondary | ICD-10-CM | POA: Diagnosis not present

## 2023-09-23 DIAGNOSIS — G8929 Other chronic pain: Secondary | ICD-10-CM

## 2023-09-23 DIAGNOSIS — M25511 Pain in right shoulder: Secondary | ICD-10-CM | POA: Diagnosis not present

## 2023-09-23 NOTE — Progress Notes (Signed)
[name] D.[facility] [address] [place] [address] Phone: [phone]   Assessment and Plan:     1. Chronic right-sided low back pain without sciatica 2. Chronic right-sided thoracic back pain 3.  Thoracolumbar DDD - Chronic with exacerbation, initial sports medicine visit - I suspect degenerative changes in thoracic and lumbar spine have likely led to stiffness and pain that intermittently worsens with physical activity - Patient's goals include improving overall strength, decreasing overall pain, and continuing to play golf.  I believe all of these goals are achievable - Start HEP and physical therapy for back - Start Tylenol 500 to 1000 mg tablets 2-3 times a day for day-to-day pain relief - Recommend using naproxen/Aleve 220 mg 1 to 2 tablets daily as needed for breakthrough pain.  Recommend limiting chronic NSAIDs to 1 to 2 days/week - X-ray obtained in clinic.  My interpretation: No acute fracture or dislocation.  Decreased anterior height of T11 vertebral body with history of fall 2 months ago.  Multiple areas of anterior vertebral spurring throughout thoracolumbar spine   4.  Chronic pain of both shoulders  -Chronic with exacerbation, initial sports medicine visit - Suspect degenerative changes in bilateral shoulders, and probable remote damage to right supraspinatus based on HPI and physical exam - Patient's goals include improving overall strength, decreasing overall pain, and continuing to play golf.  I believe all of these goals are achievable - Start HEP and physical therapy for shoulders - Start Tylenol 500 to 1000 mg tablets 2-3 times a day for day-to-day pain relief - Recommend using naproxen/Aleve 220 mg 1 to 2 tablets daily as needed for breakthrough pain.  Recommend limiting chronic NSAIDs to 1 to 2 days/week  15 additional minutes spent for educating Therapeutic Home Exercise Program.  This included exercises focusing  on stretching, strengthening, with focus on eccentric aspects.   Long term goals include an improvement in range of motion, strength, endurance as well as avoiding reinjury. Patient's frequency would include in 1-2 times a day, 3-5 times a week for a duration of 6-12 weeks. Proper technique shown and discussed handout in great detail with ATC.  All questions were discussed and answered.    Pertinent previous records reviewed include none  Follow Up: 4 weeks for reevaluation.  Could consider thoracic versus shoulder x-rays.  Could discuss OMT.  Could use NSAID or prednisone Dosepak for flares of pain   Subjective:   I, Moenique Parris, am serving as a Neurosurgeon for Doctor Richardean Sale  Chief Complaint: low back pain   HPI:   09/23/23 Patient is a 67 year old male with low back pain. Patient states his pain is middle back from golf swing. Pain when getting out of bed and turning. Pain for over a year. Advil when he goes to play golf and that helps a little. Pain is worse at night. Pain is in thoracic area. 2 months ago he had a slip and fall accident. Prednisone dos pak helped with that injury   Relevant Historical Information: History of CVA, left ear deafness, Mnire's disease  Additional pertinent review of systems negative.   Current Outpatient Medications:    albuterol (PROAIR HFA) 108 (90 Base) MCG/ACT inhaler, INHALE 2 PUFFS INTO THE LUNGS EVERY 6 (SIX) HOURS AS NEEDED FOR WHEEZING., Disp: 8.5 each, Rfl: 4   Ascorbic Acid (VITAMIN C) 1000 MG tablet, Take 1,000 mg by mouth daily., Disp: , Rfl:    Cyanocobalamin (VITAMIN B 12  PO), Take 1 tablet by mouth daily., Disp: , Rfl:    diphenhydrAMINE (BENADRYL) 25 MG tablet, Take 25 mg by mouth every 6 (six) hours as needed for allergies., Disp: , Rfl:    fluticasone (FLONASE) 50 MCG/ACT nasal spray, Place 1 spray into both nostrils as needed., Disp: , Rfl:    hydrocortisone (ANUSOL-HC) 2.5 % rectal cream, Place 1 application rectally 2 (two)  times daily as needed for hemorrhoids (itching)., Disp: , Rfl:    ibuprofen (ADVIL) 200 MG tablet, Take 200 mg by mouth as needed., Disp: , Rfl:    sildenafil (VIAGRA) 100 MG tablet, TAKE ONE-HALF TO ONE TABLET BY MOUTH DAILY AS NEEDED FOR ERECTILE DYSFUNCTION, Disp: 5 tablet, Rfl: 10   valACYclovir (VALTREX) 1000 MG tablet, Take 1 g by mouth as directed. Take 1 tablet daily for 3-4 days as needed for flare ups, Disp: , Rfl: 2   vitamin E 100 UNIT capsule, Take 100 Units by mouth daily., Disp: , Rfl:    zolpidem (AMBIEN) 10 MG tablet, Take 0.5-1 tablets (5-10 mg total) by mouth at bedtime as needed for sleep., Disp: 30 tablet, Rfl: 0   Objective:     Vitals:   09/23/23 1457  BP: 132/80  Pulse: 87  SpO2: 99%  Weight: 225 lb (102.1 kg)  Height: 5\' 11"  (1.803 m)      Body mass index is 31.38 kg/m.    Physical Exam:    Gen: Appears well, nad, nontoxic and pleasant Psych: Alert and oriented, appropriate mood and affect Neuro: sensation intact, strength is 5/5 in upper and lower extremities, muscle tone wnl Skin: no susupicious lesions or rashes  Back - Normal skin, Spine with normal alignment and no deformity.   No tenderness to vertebral process palpation.   Paraspinous muscles are not tender and without spasm NTTP gluteal musculature Straight leg raise negative Trendelenberg negative Piriformis Test negative Gait normal  Stiffness with thoracolumbar extension and rotation, though no pain    Bilateral shoulder:  No deformity, swelling or muscle wasting No scapular winging FF 180, abd 180, int 20, ext 90 TTP AC NTTP over the [place], clavicle,   coracoid, biceps groove, humerus, deltoid, trapezius, cervical spine Neg neer,  subscap liftoff, speeds Positive Hawkins, empty can, crossarm bilaterally, though more painful on right compared to left Neg ant drawer, sulcus sign, apprehension Negative Spurling's test bilat FROM of neck   Electronically signed by:  [name] [facility] 3:29 PM 09/23/23

## 2023-09-23 NOTE — Patient Instructions (Signed)
Thoracic HEP  Shoulder HEP  PT referral  Recommend using naproxen/ aleve 220 mg 1-2 tablets as needed for breakthrough pain. Recommend limiting NSAID use to 1-2 times per week  Tylenol [phone] mg 2-3 times a day for pain relief  4 week follow up

## 2023-09-27 ENCOUNTER — Other Ambulatory Visit: Payer: Self-pay | Admitting: Family Medicine

## 2023-10-14 NOTE — Progress Notes (Deleted)
    [facility] [address] [place] [address] Phone: [phone]   Assessment and Plan:     There are no diagnoses linked to this encounter.  ***   Pertinent previous records reviewed include ***    Follow Up: ***     Subjective:   I, Jason Weaver, am serving as a Neurosurgeon for Jason Weaver   Chief Complaint: low back pain    HPI:    09/23/23 Patient is a 67 year old male with low back pain. Patient states his pain is middle back from golf swing. Pain when getting out of bed and turning. Pain for over a year. Advil when he goes to play golf and that helps a little. Pain is worse at night. Pain is in thoracic area. 2 months ago he had a slip and fall accident. Prednisone dos pak helped with that injury   10/24/2023 Patient states   Relevant Historical Information: History of CVA, left ear deafness, Mnire's disease  Additional pertinent review of systems negative.   Current Outpatient Medications:    albuterol (PROAIR HFA) 108 (90 Base) MCG/ACT inhaler, INHALE 2 PUFFS INTO THE LUNGS EVERY 6 (SIX) HOURS AS NEEDED FOR WHEEZING., Disp: 8.5 each, Rfl: 4   Ascorbic Acid (VITAMIN C) 1000 MG tablet, Take 1,000 mg by mouth daily., Disp: , Rfl:    Cyanocobalamin (VITAMIN B 12 PO), Take 1 tablet by mouth daily., Disp: , Rfl:    diphenhydrAMINE (BENADRYL) 25 MG tablet, Take 25 mg by mouth every 6 (six) hours as needed for allergies., Disp: , Rfl:    fluticasone (FLONASE) 50 MCG/ACT nasal spray, Place 1 spray into both nostrils as needed., Disp: , Rfl:    hydrocortisone (ANUSOL-HC) 2.5 % rectal cream, Place 1 application rectally 2 (two) times daily as needed for hemorrhoids (itching)., Disp: , Rfl:    ibuprofen (ADVIL) 200 MG tablet, Take 200 mg by mouth as needed., Disp: , Rfl:    sildenafil (VIAGRA) 100 MG tablet, TAKE 1/2 TO 1 TABLET BY MOUTH DAILY AS NEEDED FOR ERECTILE DYSFUNCTION, Disp: 5 tablet, Rfl: 10    valACYclovir (VALTREX) 1000 MG tablet, Take 1 g by mouth as directed. Take 1 tablet daily for 3-4 days as needed for flare ups, Disp: , Rfl: 2   vitamin E 100 UNIT capsule, Take 100 Units by mouth daily., Disp: , Rfl:    zolpidem (AMBIEN) 10 MG tablet, Take 0.5-1 tablets (5-10 mg total) by mouth at bedtime as needed for sleep., Disp: 30 tablet, Rfl: 0   Objective:     There were no vitals filed for this visit.    There is no height or weight on file to calculate BMI.    Physical Exam:    ***   Electronically signed by:  [facility] 8:51 AM 10/14/23

## 2023-10-17 DIAGNOSIS — L309 Dermatitis, unspecified: Secondary | ICD-10-CM | POA: Diagnosis not present

## 2023-10-17 DIAGNOSIS — L570 Actinic keratosis: Secondary | ICD-10-CM | POA: Diagnosis not present

## 2023-10-17 DIAGNOSIS — D0359 Melanoma in situ of other part of trunk: Secondary | ICD-10-CM | POA: Diagnosis not present

## 2023-10-17 DIAGNOSIS — D485 Neoplasm of uncertain behavior of skin: Secondary | ICD-10-CM | POA: Diagnosis not present

## 2023-10-17 DIAGNOSIS — L738 Other specified follicular disorders: Secondary | ICD-10-CM | POA: Diagnosis not present

## 2023-10-17 DIAGNOSIS — Z85828 Personal history of other malignant neoplasm of skin: Secondary | ICD-10-CM | POA: Diagnosis not present

## 2023-10-17 DIAGNOSIS — D0362 Melanoma in situ of left upper limb, including shoulder: Secondary | ICD-10-CM | POA: Diagnosis not present

## 2023-10-24 ENCOUNTER — Ambulatory Visit: Payer: PPO | Admitting: Sports Medicine

## 2023-10-31 DIAGNOSIS — D0362 Melanoma in situ of left upper limb, including shoulder: Secondary | ICD-10-CM | POA: Diagnosis not present

## 2023-10-31 DIAGNOSIS — Z85828 Personal history of other malignant neoplasm of skin: Secondary | ICD-10-CM | POA: Diagnosis not present

## 2023-11-27 DIAGNOSIS — L218 Other seborrheic dermatitis: Secondary | ICD-10-CM | POA: Diagnosis not present

## 2023-11-27 DIAGNOSIS — L821 Other seborrheic keratosis: Secondary | ICD-10-CM | POA: Diagnosis not present

## 2023-11-27 DIAGNOSIS — L905 Scar conditions and fibrosis of skin: Secondary | ICD-10-CM | POA: Diagnosis not present

## 2023-11-27 DIAGNOSIS — C44319 Basal cell carcinoma of skin of other parts of face: Secondary | ICD-10-CM | POA: Diagnosis not present

## 2023-11-27 DIAGNOSIS — D485 Neoplasm of uncertain behavior of skin: Secondary | ICD-10-CM | POA: Diagnosis not present

## 2023-11-27 DIAGNOSIS — D225 Melanocytic nevi of trunk: Secondary | ICD-10-CM | POA: Diagnosis not present

## 2023-11-27 DIAGNOSIS — Z85828 Personal history of other malignant neoplasm of skin: Secondary | ICD-10-CM | POA: Diagnosis not present

## 2023-11-27 DIAGNOSIS — L570 Actinic keratosis: Secondary | ICD-10-CM | POA: Diagnosis not present

## 2023-11-27 DIAGNOSIS — Z85820 Personal history of malignant melanoma of skin: Secondary | ICD-10-CM | POA: Diagnosis not present

## 2023-12-12 ENCOUNTER — Encounter: Payer: 59 | Admitting: Family Medicine

## 2023-12-15 NOTE — Therapy (Signed)
 OUTPATIENT PHYSICAL THERAPY THORACOLUMBAR EVALUATION   Patient Name: Jason Weaver MRN: [id number] DOB:21-Jun-1956, 68 y.o., male Today's Date: 12/17/2023  END OF SESSION:  PT End of Session - 12/16/23 1416     Visit Number 1    Number of Visits 12    Date for PT Re-Evaluation 01/27/24    Authorization Type HTA    PT Start Time 1414   Pt arrived late to treatment   PT Stop Time 1505    PT Time Calculation (min) 51 min    Activity Tolerance Patient tolerated treatment well    Behavior During Therapy [facility] for tasks assessed/performed             Past Medical History:  Diagnosis Date   Cardiomyopathy, nonischemic (HCC)    Diverticulosis of colon    ED (erectile dysfunction)    Gout    Hearing loss    Hx of colonic polyps    Hypercholesteremia 12/05/2018   Hyperlipidemia    Nasal septal deviation    Obesity    OSA (obstructive sleep apnea)    PAF (paroxysmal atrial fibrillation) (HCC)    Pulmonary sarcoidosis (HCC)  1997    biopsy proven   Past Surgical History:  Procedure Laterality Date   CATARACT EXTRACTION     HERNIA REPAIR  2009   LUNG BIOPSY  1997   TEE WITHOUT CARDIOVERSION N/A 11/25/2018   Procedure: TRANSESOPHAGEAL ECHOCARDIOGRAM (TEE);  Surgeon: Elder Negus, MD;  Location: [facility] ENDOSCOPY;  Service: Cardiovascular;  Laterality: N/A;   TONSILLECTOMY  1979   Patient Active Problem List   Diagnosis Date Noted   Chronic low back pain 03/24/2021   Low testosterone 03/24/2021   Meniere's disease 03/23/2020   Acquired nasal septal defect 10/06/2019   CVA (cerebrovascular accident) (HCC) 11/14/2018   Numbness and tingling of foot 10/20/2018   Insomnia 10/20/2018   Erectile dysfunction 10/20/2018   Anal fissure 10/20/2018   Cold sore 10/20/2018   Osteoarthritis 04/20/2014   Allergic rhinitis 05/29/2012   OSA (obstructive sleep apnea) 06/04/2011   Pulmonary sarcoidosis (HCC) 01/11/2011   Gout 11/11/2009   Diverticulosis of large intestine 11/11/2009    History of colonic polyps 11/11/2009   Dyslipidemia with statin intolerance 10/24/2009   Deafness, left 08/23/2009     REFERRING PROVIDER: Richardean Sale, DO  REFERRING DIAG: M54.50,G89.29 (ICD-10-CM) - Chronic right-sided low back pain without sciatica M51.35 (ICD-10-CM) - DDD (degenerative disc disease), thoracolumbar M54.6,G89.29 (ICD-10-CM) - Chronic right-sided thoracic back pain M25.511,G89.29,M25.512 (ICD-10-CM) - Chronic pain of both shoulders  Rationale for Evaluation and Treatment: Rehabilitation  THERAPY DIAG:  Other low back pain  Pain in thoracic spine  Bilateral shoulder pain, unspecified chronicity  Muscle weakness (generalized)  ONSET DATE: 09/23/2023  PT order  SUBJECTIVE:  SUBJECTIVE STATEMENT: Pt states he has had back pain for a couple of years.  Pt thinks it may have started from a bad golf swing.  Pt has been having intermittent pain, but more constant now.  Pt had a fall which significantly increased his back pain last summer.  He was put on prednisone and felt great.  Pt denies pain in back and shoulders when he was on prednisone.  He uses advil.  PA recommended tylenol though hasn't tried it yet due to not playing golf yet.   Pt has pain when getting out of bed.  He states he can't do a sit up.  Pt has pain with rotating to the Right.  Pt enjoys playing golf and has increased pain with swinging golf club.  Pt has disturbed sleep due to pain.  Pt reports he is not limited with ambulation due to back.    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  R shoulder pain began last summer when throwing a tennis ball for dog.  Pt has shoulder pain with lifting.  Pt has pain with reaching laterally.    Pt saw PA on 12/2 and  was educated with HEP.  Pt received an exercise handout and hasn't been doing them as much as he should.  He states some of the exercises can be painful.  PA note indicated suspect degenerative changes in bilateral shoulders and probable remote damage to right supraspinatus.  PERTINENT HISTORY:  Chronic back pain L THR with posterolateral approach in 09/2022 OA CVA in 2020, left ear deafness, vestibular issues on L side  Pulmonary sarcoidosis    PAIN:  3-4/10 Current, 7-8/10 worst, 2-3/10 best Location:  central and R sided lower thoracic to upper lumbar  1-2/10 current, 10/10 worst, 0/10 best Location:  bilat shoulders, R > L  Pt is R handed dominant  PRECAUTIONS: Other: L THR with posterolateral approach 12/23, CVA, vestibular issues on L side, pulmonary sarcoidosis   WEIGHT BEARING RESTRICTIONS: No  FALLS:  Has patient fallen in last 6 months? No  LIVING ENVIRONMENT: Lives with: lives with their spouse Lives in: 1 story home Stairs: 2 steps to enter without rail.  Pt has a ramp also.   OCCUPATION: Pt is retired.  PLOF: Independent.  Pt typically plays a couple of rounds a golf per week.  Pt works out it in [employer] 3 days per week.   PATIENT GOALS: to not have pain with activities, to play golf without pain and stiffness   OBJECTIVE:  Note: Objective measures were completed at Evaluation unless otherwise noted.  DIAGNOSTIC FINDINGS:  X rays on 09/23/2023: FINDINGS: There is no evidence of lumbar spine fracture. Alignment is normal. Degenerative changes at each lumbar level with marginal osteophytes and disc space narrowing. Findings most severe at L5-S1.   IMPRESSION: Degenerative changes. No acute osseous abnormalities.  PA note indicated No acute fx or dislocation. decreased anterior height of T11 vertebral body with a hx of fall 2 months ago.  multiple areas of anterior vertebral spurring t/o thoracolumbar spine.   PATIENT SURVEYS:  Modified Oswestry 12%    COGNITION: Overall cognitive status: Within functional limits for tasks assessed     PALPATION: Assess next visit  LUMBAR / Thoracic ROM:   AROM Lumbar   Flexion WFL   Extension WFL   Right lateral flexion 90%   Left lateral flexion 60%   Right rotation [facility]  Left rotation WFL    (Blank rows = not tested) Pt reports some tightness with lumbar ROM though no pain.   UPPER EXTREMITY ROM:     Active  Right eval Left eval  Shoulder flexion 157 174  Shoulder scaption 163 175  Shoulder abduction 148 168                                       (Blank rows = not tested)  MMT:    MMT Right eval Left eval  Hip flexion 5/5 5/5  Hip extension    Hip abduction    Shoulder flexion 5/5 5/5  Shoulder abduction 5/5 5/5  Shoulder scaption 5/5 5/5  Knee flexion    Knee extension 5/5 5/5  Ankle dorsiflexion    Ankle plantarflexion    Ankle inversion    Ankle eversion     (Blank rows = not tested)  LUMBAR SPECIAL TESTS:   Impingement Tests:  Neer's, Leanord Asal, and crossover tests:  negative bilat Empty can test:  positive bilat but more painful on right    TREATMENT DATE:                                                                                                                                PT educated pt in log rolling.  Pt performed log rolling with cuing and instruction in correct form and sequencing.  PT gave pt a handout.    PATIENT EDUCATION:  Education details: log rolling, dx, objective findings, relevant anatomy, rationale of interventions, POC, and what to expect next treatment.  Person educated: Patient Education method: Explanation, Demonstration, Verbal cues, and Handouts Education comprehension: verbalized understanding, returned demonstration, verbal cues required, and needs further education  HOME EXERCISE PROGRAM: Access Code: [email or web address] URL: [email or web address] Date: 12/17/2023 Prepared by: Aaron Edelman  Exercises - Log Roll   Pt has an exercise program from PA.   ASSESSMENT:  CLINICAL IMPRESSION: Patient is a 68 y.o. male with dx's of chronic right-sided low back pain without sciatica, DDD thoracolumbar, chronic right-sided thoracic back pain, and chronic pain of both shoulders.  Pt has been having intermittent pain, but more constant now.  Pt had a fall which significantly increased his back pain last summer.   Pt has pain when getting out of bed and has disturbed sleep.  Pt enjoys playing golf and has increased pain with swinging golf club.  Pt has shoulder pain with reaching laterally and also lifting objects.  Pt had negative impingement tests and positive Empty Can test which was worse on R.  Pt received an exercise program from PA and hasn't been doing them as much as he should.  He does report some pain with some of the exercises.  Pt should benefit from skilled PT to address goals and impairments and to improve overall function.  OBJECTIVE IMPAIRMENTS: decreased activity tolerance, decreased endurance, decreased mobility, decreased ROM, decreased strength, hypomobility, increased fascial restrictions, impaired UE functional use, and pain.   ACTIVITY LIMITATIONS: lifting, sleeping, bed mobility, and reaching laterally  PARTICIPATION LIMITATIONS:  golf  PERSONAL FACTORS: Time since onset of injury/illness/exacerbation and 3+ comorbidities: L THR, OA, CVA, pulmonary sarcoidosis and vestibular issues on L side   are also affecting patient's functional outcome.   REHAB POTENTIAL: Good  CLINICAL DECISION MAKING: Evolving/moderate complexity  EVALUATION COMPLEXITY: Moderate   GOALS:  SHORT TERM GOALS: Target date: 01/06/2024   Pt will be independent and compliant with HEP for improved pain, ROM, strength, and function.  Baseline: Goal status: INITIAL  2.  Pt will demo lumbar SB'ing AROM to be [facility] bilat for improved stiffness and mobility Baseline:  Goal status:  INITIAL  3.  Pt will report at least a 25% improvement in pain and sx's overall.   Baseline:  Goal status: INITIAL    LONG TERM GOALS: Target date: 01/27/2024  Pt will be able to sleep at least 5/7 nights/wk without pain disturbance.  Baseline:  Goal status: INITIAL  2.  Pt will report at least a 70% improvement overall in pain and sx's.   Baseline:  Goal status: INITIAL  3.  Pt will demo improved core strength by progressing with core exercises without adverse effects for improved core stability with functional mobility, daily activities, and golf.  Baseline:  Goal status: INITIAL  4.  Pt will be able to reach overhead and laterally without significant shoulder pain.  Baseline:  Goal status: INITIAL  5.  Pt will report at least a 70% reduction in pain with R rotation for improved pain with playing golf.  Baseline:  Goal status: INITIAL    PLAN:  PT FREQUENCY: 2x/week  PT DURATION: 6 weeks  PLANNED INTERVENTIONS: 97164- PT Re-evaluation, 97110-Therapeutic exercises, 97530- Therapeutic activity, 97112- Neuromuscular re-education, 97535- Self Care, [address]- Manual therapy, [phone]- Gait training, [phone]- Aquatic Therapy, [phone]- Ultrasound, Patient/Family education, Balance training, Stair training, Taping, Dry Needling, Joint mobilization, Spinal mobilization, Cryotherapy, and Moist heat.  PLAN FOR NEXT SESSION: Assess shoulder ER/IR ROM and strength, HS tightness, supine SLR test, tenderness and soft tissue tightness in thoracolumbar next visit.  Review exercise program from MD.  Work on [employer] PT, DPT 12/17/23 3:35 PM

## 2023-12-16 ENCOUNTER — Other Ambulatory Visit: Payer: Self-pay

## 2023-12-16 ENCOUNTER — Ambulatory Visit (HOSPITAL_BASED_OUTPATIENT_CLINIC_OR_DEPARTMENT_OTHER): Payer: PPO | Attending: Sports Medicine | Admitting: Physical Therapy

## 2023-12-16 DIAGNOSIS — M6281 Muscle weakness (generalized): Secondary | ICD-10-CM | POA: Diagnosis not present

## 2023-12-16 DIAGNOSIS — M25511 Pain in right shoulder: Secondary | ICD-10-CM | POA: Diagnosis not present

## 2023-12-16 DIAGNOSIS — M5135 Other intervertebral disc degeneration, thoracolumbar region: Secondary | ICD-10-CM | POA: Diagnosis not present

## 2023-12-16 DIAGNOSIS — M546 Pain in thoracic spine: Secondary | ICD-10-CM | POA: Diagnosis not present

## 2023-12-16 DIAGNOSIS — M545 Low back pain, unspecified: Secondary | ICD-10-CM | POA: Insufficient documentation

## 2023-12-16 DIAGNOSIS — M25512 Pain in left shoulder: Secondary | ICD-10-CM | POA: Diagnosis not present

## 2023-12-16 DIAGNOSIS — G8929 Other chronic pain: Secondary | ICD-10-CM | POA: Insufficient documentation

## 2023-12-16 DIAGNOSIS — M5459 Other low back pain: Secondary | ICD-10-CM | POA: Insufficient documentation

## 2023-12-17 ENCOUNTER — Encounter (HOSPITAL_BASED_OUTPATIENT_CLINIC_OR_DEPARTMENT_OTHER): Payer: Self-pay | Admitting: Physical Therapy

## 2023-12-19 ENCOUNTER — Encounter (HOSPITAL_BASED_OUTPATIENT_CLINIC_OR_DEPARTMENT_OTHER): Payer: Self-pay | Admitting: Physical Therapy

## 2023-12-19 ENCOUNTER — Ambulatory Visit (HOSPITAL_BASED_OUTPATIENT_CLINIC_OR_DEPARTMENT_OTHER): Payer: PPO | Admitting: Physical Therapy

## 2023-12-19 DIAGNOSIS — M5459 Other low back pain: Secondary | ICD-10-CM

## 2023-12-19 DIAGNOSIS — M6281 Muscle weakness (generalized): Secondary | ICD-10-CM

## 2023-12-19 DIAGNOSIS — M546 Pain in thoracic spine: Secondary | ICD-10-CM

## 2023-12-19 DIAGNOSIS — M25511 Pain in right shoulder: Secondary | ICD-10-CM

## 2023-12-19 NOTE — Therapy (Signed)
 OUTPATIENT PHYSICAL THERAPY THORACOLUMBAR EVALUATION   Patient Name: Jason Weaver MRN: [id number] DOB:[birth year], 68 y.o., male Today's Date: 12/19/2023  END OF SESSION:  PT End of Session - 12/19/23 1442     Visit Number 2    Number of Visits 12    Date for PT Re-Evaluation 01/27/24    Authorization Type HTA    PT Start Time 1404    PT Stop Time 1451    PT Time Calculation (min) 47 min    Activity Tolerance Patient tolerated treatment well    Behavior During Therapy [facility] for tasks assessed/performed              Past Medical History:  Diagnosis Date   Cardiomyopathy, nonischemic (HCC)    Diverticulosis of colon    ED (erectile dysfunction)    Gout    Hearing loss    Hx of colonic polyps    Hypercholesteremia 12/05/2018   Hyperlipidemia    Nasal septal deviation    Obesity    OSA (obstructive sleep apnea)    PAF (paroxysmal atrial fibrillation) (HCC)    Pulmonary sarcoidosis (HCC)  1997    biopsy proven   Past Surgical History:  Procedure Laterality Date   CATARACT EXTRACTION     HERNIA REPAIR  2009   LUNG BIOPSY  1997   TEE WITHOUT CARDIOVERSION N/A 11/25/2018   Procedure: TRANSESOPHAGEAL ECHOCARDIOGRAM (TEE);  Surgeon: Elder Negus, MD;  Location: [facility] ENDOSCOPY;  Service: Cardiovascular;  Laterality: N/A;   TONSILLECTOMY  1979   Patient Active Problem List   Diagnosis Date Noted   Chronic low back pain 03/24/2021   Low testosterone 03/24/2021   Meniere's disease 03/23/2020   Acquired nasal septal defect 10/06/2019   CVA (cerebrovascular accident) (HCC) 11/14/2018   Numbness and tingling of foot 10/20/2018   Insomnia 10/20/2018   Erectile dysfunction 10/20/2018   Anal fissure 10/20/2018   Cold sore 10/20/2018   Osteoarthritis 04/20/2014   Allergic rhinitis 05/29/2012   OSA (obstructive sleep apnea) 06/04/2011   Pulmonary sarcoidosis (HCC) 01/11/2011   Gout 11/11/2009   Diverticulosis of large intestine 11/11/2009   History of colonic  polyps 11/11/2009   Dyslipidemia with statin intolerance 10/24/2009   Deafness, left 08/23/2009     REFERRING PROVIDER: Richardean Sale, DO  REFERRING DIAG: M54.50,G89.29 (ICD-10-CM) - Chronic right-sided low back pain without sciatica M51.35 (ICD-10-CM) - DDD (degenerative disc disease), thoracolumbar M54.6,G89.29 (ICD-10-CM) - Chronic right-sided thoracic back pain M25.511,G89.29,M25.512 (ICD-10-CM) - Chronic pain of both shoulders  Rationale for Evaluation and Treatment: Rehabilitation  THERAPY DIAG:  Other low back pain  Pain in thoracic spine  Bilateral shoulder pain, unspecified chronicity  Muscle weakness (generalized)  ONSET DATE: 09/23/2023  PT order  SUBJECTIVE:  SUBJECTIVE STATEMENT:  Pt denies any adverse effects after prior Rx.  Pt played 18 holes of golf yesterday.  He did have some pain with turning to R on back swing.  He reports no pain after playing just tightness.  Pt unable to remember the exercises from the PA.  Pt states he has been performing the log roll.    PERTINENT HISTORY:  Chronic back pain R THR with posterolateral approach in 09/2022 OA CVA in 2020, left ear deafness, vestibular issues on L side  Pulmonary sarcoidosis    PAIN:  1/10 at rest and 4-5/10 with rotation Currently, 7-8/10 worst, 2-3/10 best Location:  central and R sided lower thoracic to upper lumbar  0/10 current, 10/10 worst, 0/10 best Location:  bilat shoulders, R > L  Pt is R handed dominant  PRECAUTIONS: Other: R THR with posterolateral approach 12/23, CVA, vestibular issues on L side, pulmonary sarcoidosis   WEIGHT BEARING RESTRICTIONS: No  FALLS:  Has patient fallen in last 6 months? No  LIVING ENVIRONMENT: Lives with: lives with their spouse Lives in: 1 story home Stairs: 2  steps to enter without rail.  Pt has a ramp also.   OCCUPATION: Pt is retired.  PLOF: Independent.  Pt typically plays a couple of rounds a golf per week.  Pt works out it in [employer] 3 days per week.   PATIENT GOALS: to not have pain with activities, to play golf without pain and stiffness   OBJECTIVE:  Note: Objective measures were completed at Evaluation unless otherwise noted.  DIAGNOSTIC FINDINGS:  X rays on 09/23/2023: FINDINGS: There is no evidence of lumbar spine fracture. Alignment is normal. Degenerative changes at each lumbar level with marginal osteophytes and disc space narrowing. Findings most severe at L5-S1.   IMPRESSION: Degenerative changes. No acute osseous abnormalities.  PA note indicated No acute fx or dislocation. decreased anterior height of T11 vertebral body with a hx of fall 2 months ago.  multiple areas of anterior vertebral spurring t/o thoracolumbar spine.    PALPATION: Pt has moderate + tightness in lower thoracic and upper lumbar paraspinals and moderate tightness in mid to lower lumbar paraspinals.  Pt had no tenderness with palpatoin  LUMBAR / Thoracic ROM:   AROM Lumbar Thoracic 2/27  Flexion [facility] WFL  Extension WFL 50%  Right lateral flexion 90% WFL  Left lateral flexion 60% WFL  Right rotation WFL 75%  Left rotation WFL 75%   (Blank rows = not tested) Pt reports some tightness with lumbar ROM though no pain.   UPPER EXTREMITY ROM:     Active  Right eval Left eval Right 2/27 Left 2/27  Shoulder flexion 157 174    Shoulder scaption 163 175    Shoulder abduction 148 168    Shoulder ER   81 71  Shoulder IR   67 79                                             (Blank rows = not tested)  MMT:    MMT Right eval Left eval Right 2/27 Left 2/27  Hip flexion 5/5 5/5    Hip extension      Hip abduction      Shoulder flexion 5/5 5/5    Shoulder abduction 5/5 5/5    Shoulder scaption 5/5 5/5    Knee flexion  Knee  extension 5/5 5/5    Ankle dorsiflexion      Ankle plantarflexion      Shoulder ER   4+/5 with pain 5/5 with slight pain  Shoulder IR   5/5 5/5   (Blank rows = not tested)  LUMBAR SPECIAL TESTS:   Supine SLR test: negative bilat HS flexibility:  WFL    TREATMENT DATE:                                                                                                                                Assessed thoracic and shoulder ROM, shoulder strength, HS flexibility, and supine SLR test.  See above.   PT instructed pt in correct form and palpation of TrA contraction.  Pt performed supine TrA contractions with and without 5 sec hold in supine.  Rows with with retraction with TrA with RTB 2x10-12 S/L open book x 10 and x 5 reps bilat     Pt received a HEP handout and was educated in correct form and appropriate frequency.  PT instructed pt he should not have pain with HEP.   PATIENT EDUCATION:  Education details:  PT instructed pt to bring in his HEP from PA.  log rolling, dx, objective findings, relevant anatomy, rationale of interventions, and POC.  Person educated: Patient Education method: Explanation, Demonstration, Verbal cues, and Handouts Education comprehension: verbalized understanding, returned demonstration, verbal cues required, and needs further education  HOME EXERCISE PROGRAM: Access Code: [email or web address] URL: [email or web address] Date: 12/17/2023 Prepared by: Aaron Edelman  Exercises - Log Roll   Pt has an exercise program from PA.   Updated HEP: - Supine Transversus Abdominis Bracing - Hands on Stomach  - 2 x daily - 7 x weekly - 2 sets - 10 reps - 5 seconds hold - Standing Shoulder Row with Anchored Resistance  - 1 x daily - 5 x weekly - 2 sets - 10 reps - Sidelying Open Book Thoracic Lumbar Rotation  - 2 x daily - 7 x weekly - 1-2 sets - 10 reps    ASSESSMENT:  CLINICAL IMPRESSION: Pt has limited thoracic ROM in extension and bilat rotation.  Pt  has good HS flexibility and negative supine SLR test bilat.  PT assessed shoulder ER/IR strength and ROM.  Pt had some pain with shoulder ER strength R > L and had slight weakness in R shoulder ER.  Pt does have tightness in bilat thoracic and lumbar paraspinals and was tighter in lower thoracic/upper lumbar paraspinals.  PT updated HEP and gave pt a HEP handout.  Pt performed exercises well with cuing and instruction in correct form.  He responded well to Rx having no c/o's and no increased pain after Rx.  He states he feels good after Rx.  Pt should benefit from continued skilled PT to address goals and impairments and to improve overall function.     OBJECTIVE IMPAIRMENTS: decreased activity tolerance, decreased endurance, decreased mobility,  decreased ROM, decreased strength, hypomobility, increased fascial restrictions, impaired UE functional use, and pain.   ACTIVITY LIMITATIONS: lifting, sleeping, bed mobility, and reaching laterally  PARTICIPATION LIMITATIONS:  golf  PERSONAL FACTORS: Time since onset of injury/illness/exacerbation and 3+ comorbidities: L THR, OA, CVA, pulmonary sarcoidosis and vestibular issues on L side   are also affecting patient's functional outcome.   REHAB POTENTIAL: Good  CLINICAL DECISION MAKING: Evolving/moderate complexity  EVALUATION COMPLEXITY: Moderate   GOALS:  SHORT TERM GOALS: Target date: 01/06/2024   Pt will be independent and compliant with HEP for improved pain, ROM, strength, and function.  Baseline: Goal status: INITIAL  2.  Pt will demo lumbar SB'ing AROM to be [facility] bilat for improved stiffness and mobility Baseline:  Goal status: INITIAL  3.  Pt will report at least a 25% improvement in pain and sx's overall.   Baseline:  Goal status: INITIAL    LONG TERM GOALS: Target date: 01/27/2024  Pt will be able to sleep at least 5/7 nights/wk without pain disturbance.  Baseline:  Goal status: INITIAL  2.  Pt will report at least a 70%  improvement overall in pain and sx's.   Baseline:  Goal status: INITIAL  3.  Pt will demo improved core strength by progressing with core exercises without adverse effects for improved core stability with functional mobility, daily activities, and golf.  Baseline:  Goal status: INITIAL  4.  Pt will be able to reach overhead and laterally without significant shoulder pain.  Baseline:  Goal status: INITIAL  5.  Pt will report at least a 70% reduction in pain with R rotation for improved pain with playing golf.  Baseline:  Goal status: INITIAL    PLAN:  PT FREQUENCY: 2x/week  PT DURATION: 6 weeks  PLANNED INTERVENTIONS: 97164- PT Re-evaluation, 97110-Therapeutic exercises, 97530- Therapeutic activity, 97112- Neuromuscular re-education, 97535- Self Care, 57846- Manual therapy, [phone]- Gait training, [phone]- Aquatic Therapy, [phone]- Ultrasound, Patient/Family education, Balance training, Stair training, Taping, Dry Needling, Joint mobilization, Spinal mobilization, Cryotherapy, and Moist heat.  PLAN FOR NEXT SESSION:  STM to thoracic and lumbar paraspinals.  Pt to bring in HEP from PA and plan to review exercise program.  Cont with core and postural strengthening.    Audie Clear III PT, DPT 12/19/23 3:16 PM

## 2023-12-23 NOTE — Therapy (Signed)
 OUTPATIENT PHYSICAL THERAPY TREATMENT   Patient Name: Jason Weaver MRN: [id number] DOB:1956/01/25, 68 y.o., male Today's Date: 12/24/2023  END OF SESSION:  PT End of Session - 12/24/23 1017     Visit Number 3    Number of Visits 12    Date for PT Re-Evaluation 01/27/24    Authorization Type HTA    PT Start Time 1017    PT Stop Time 1059    PT Time Calculation (min) 42 min    Activity Tolerance Patient tolerated treatment well               Past Medical History:  Diagnosis Date   Cardiomyopathy, nonischemic (HCC)    Diverticulosis of colon    ED (erectile dysfunction)    Gout    Hearing loss    Hx of colonic polyps    Hypercholesteremia 12/05/2018   Hyperlipidemia    Nasal septal deviation    Obesity    OSA (obstructive sleep apnea)    PAF (paroxysmal atrial fibrillation) (HCC)    Pulmonary sarcoidosis (HCC)  1997    biopsy proven   Past Surgical History:  Procedure Laterality Date   CATARACT EXTRACTION     HERNIA REPAIR  2009   LUNG BIOPSY  1997   TEE WITHOUT CARDIOVERSION N/A 11/25/2018   Procedure: TRANSESOPHAGEAL ECHOCARDIOGRAM (TEE);  Surgeon: Elder Negus, MD;  Location: [facility] ENDOSCOPY;  Service: Cardiovascular;  Laterality: N/A;   TONSILLECTOMY  1979   Patient Active Problem List   Diagnosis Date Noted   Chronic low back pain 03/24/2021   Low testosterone 03/24/2021   Meniere's disease 03/23/2020   Acquired nasal septal defect 10/06/2019   CVA (cerebrovascular accident) (HCC) 11/14/2018   Numbness and tingling of foot 10/20/2018   Insomnia 10/20/2018   Erectile dysfunction 10/20/2018   Anal fissure 10/20/2018   Cold sore 10/20/2018   Osteoarthritis 04/20/2014   Allergic rhinitis 05/29/2012   OSA (obstructive sleep apnea) 06/04/2011   Pulmonary sarcoidosis (HCC) 01/11/2011   Gout 11/11/2009   Diverticulosis of large intestine 11/11/2009   History of colonic polyps 11/11/2009   Dyslipidemia with statin intolerance 10/24/2009    Deafness, left 08/23/2009     REFERRING PROVIDER: Richardean Sale, DO  REFERRING DIAG: M54.50,G89.29 (ICD-10-CM) - Chronic right-sided low back pain without sciatica M51.35 (ICD-10-CM) - DDD (degenerative disc disease), thoracolumbar M54.6,G89.29 (ICD-10-CM) - Chronic right-sided thoracic back pain M25.511,G89.29,M25.512 (ICD-10-CM) - Chronic pain of both shoulders  Rationale for Evaluation and Treatment: Rehabilitation  THERAPY DIAG:  Other low back pain  Pain in thoracic spine  Bilateral shoulder pain, unspecified chronicity  Muscle weakness (generalized)  ONSET DATE: 09/23/2023  PT order  SUBJECTIVE:  SUBJECTIVE STATEMENT: Reports baseline back pain and shoulder pain. Still has difficulty with twisting/rotation movements. Played golf over the weekend and states he felt okay with it. He does bring in his exercise program from doctor.     PERTINENT HISTORY:  Chronic back pain R THR with posterolateral approach in 09/2022 OA CVA in 2020, left ear deafness, vestibular issues on L side  Pulmonary sarcoidosis    PAIN:  5/10 with rotation ;  Currently, 7-8/10 worst, 2-3/10 best Location:  central and R sided lower thoracic to upper lumbar  0/10 current, 10/10 worst, 0/10 best Location:  bilat shoulders, R > L  Pt is R handed dominant  PRECAUTIONS: Other: R THR with posterolateral approach 12/23, CVA, vestibular issues on L side, pulmonary sarcoidosis   WEIGHT BEARING RESTRICTIONS: No  FALLS:  Has patient fallen in last 6 months? No  LIVING ENVIRONMENT: Lives with: lives with their spouse Lives in: 1 story home Stairs: 2 steps to enter without rail.  Pt has a ramp also.   OCCUPATION: Pt is retired.  PLOF: Independent.  Pt typically plays a couple of rounds a golf per week.  Pt  works out it in [employer] 3 days per week.   PATIENT GOALS: to not have pain with activities, to play golf without pain and stiffness   OBJECTIVE:  Note: Objective measures were completed at Evaluation unless otherwise noted.  DIAGNOSTIC FINDINGS:  X rays on 09/23/2023: FINDINGS: There is no evidence of lumbar spine fracture. Alignment is normal. Degenerative changes at each lumbar level with marginal osteophytes and disc space narrowing. Findings most severe at L5-S1.   IMPRESSION: Degenerative changes. No acute osseous abnormalities.  PA note indicated No acute fx or dislocation. decreased anterior height of T11 vertebral body with a hx of fall 2 months ago.  multiple areas of anterior vertebral spurring t/o thoracolumbar spine.    PALPATION: Pt has moderate + tightness in lower thoracic and upper lumbar paraspinals and moderate tightness in mid to lower lumbar paraspinals.  Pt had no tenderness with palpatoin  LUMBAR / Thoracic ROM:   AROM Lumbar Thoracic 2/27  Flexion [facility] WFL  Extension WFL 50%  Right lateral flexion 90% WFL  Left lateral flexion 60% WFL  Right rotation WFL 75%  Left rotation WFL 75%   (Blank rows = not tested) Pt reports some tightness with lumbar ROM though no pain.   UPPER EXTREMITY ROM:     Active  Right eval Left eval Right 2/27 Left 2/27  Shoulder flexion 157 174    Shoulder scaption 163 175    Shoulder abduction 148 168    Shoulder ER   81 71  Shoulder IR   67 79                                             (Blank rows = not tested)  MMT:    MMT Right eval Left eval Right 2/27 Left 2/27  Hip flexion 5/5 5/5    Hip extension      Hip abduction      Shoulder flexion 5/5 5/5    Shoulder abduction 5/5 5/5    Shoulder scaption 5/5 5/5    Knee flexion      Knee extension 5/5 5/5    Ankle dorsiflexion      Ankle plantarflexion      Shoulder  ER   4+/5 with pain 5/5 with slight pain  Shoulder IR   5/5 5/5   (Blank rows = not  tested)  LUMBAR SPECIAL TESTS:   Supine SLR test: negative bilat HS flexibility:  WFL    TREATMENT DATE:                                                                                                                               [facility] Adult PT Treatment:                                                DATE: 12/24/23 Therapeutic Exercise: Short lever open books x10 BIL  Standing short lever open books x10 BIL HEP handout + education  Neuromuscular re-ed: Single arm rotational row green band, staggered stance x8 BIL cues for posture, form, scapular mechanics and end range stretch into protraction Green band paloff press x10 BIL cues for posture/pacing Standing red band shoulder ext on airex 2x10 cues for posture High>low red band single arm row x8     Assessed thoracic and shoulder ROM, shoulder strength, HS flexibility, and supine SLR test.  See above.   PT instructed pt in correct form and palpation of TrA contraction.  Pt performed supine TrA contractions with and without 5 sec hold in supine.  Rows with with retraction with TrA with RTB 2x10-12 S/L open book x 10 and x 5 reps bilat     Pt received a HEP handout and was educated in correct form and appropriate frequency.  PT instructed pt he should not have pain with HEP.   PATIENT EDUCATION:  Education details: rationale for interventions, HEP  Person educated: Patient Education method: Explanation, Demonstration, Tactile cues, Verbal cues Education comprehension: verbalized understanding, returned demonstration, verbal cues required, tactile cues required, and needs further education     HOME EXERCISE PROGRAM: Access Code: [email or web address] URL: [email or web address] Date: 12/24/2023 Prepared by: Fransisco Hertz  Exercises - Log Roll  - Supine Transversus Abdominis Bracing - Hands on Stomach  - 2 x daily - 7 x weekly - 2 sets - 10 reps - 5 seconds hold - Standing Shoulder Row with Anchored Resistance  - 1 x daily -  5 x weekly - 2 sets - 10 reps - Sidelying Open Book Thoracic Lumbar Rotation  - 2 x daily - 7 x weekly - 1-2 sets - 10 reps - Standing Anti-Rotation Press with Anchored Resistance  - 5 x weekly - 1-2 sets - 8 reps    ASSESSMENT:  CLINICAL IMPRESSION: Pt arrives w/ report of baseline pain, no issues after last session. Today we focus on modifications with mobility work as he reports some mild shoulder irritation initially with open books that resolves w/ change to short lever movement. Also progressing for increased demand with core rotation/antirotational  stability. Does demonstrate some compensations throughout that require verbal/tactile cues but tolerates session well. Reports some modest improvement in pain as session goes on, no adverse events. Recommend continuing along current POC in order to address relevant deficits and improve functional tolerance. Pt departs today's session in no acute distress, all voiced questions/concerns addressed appropriately from PT perspective.     OBJECTIVE IMPAIRMENTS: decreased activity tolerance, decreased endurance, decreased mobility, decreased ROM, decreased strength, hypomobility, increased fascial restrictions, impaired UE functional use, and pain.   ACTIVITY LIMITATIONS: lifting, sleeping, bed mobility, and reaching laterally  PARTICIPATION LIMITATIONS:  golf  PERSONAL FACTORS: Time since onset of injury/illness/exacerbation and 3+ comorbidities: L THR, OA, CVA, pulmonary sarcoidosis and vestibular issues on L side   are also affecting patient's functional outcome.   REHAB POTENTIAL: Good  CLINICAL DECISION MAKING: Evolving/moderate complexity  EVALUATION COMPLEXITY: Moderate   GOALS:  SHORT TERM GOALS: Target date: 01/06/2024   Pt will be independent and compliant with HEP for improved pain, ROM, strength, and function.  Baseline: Goal status: INITIAL  2.  Pt will demo lumbar SB'ing AROM to be [facility] bilat for improved stiffness and  mobility Baseline:  Goal status: INITIAL  3.  Pt will report at least a 25% improvement in pain and sx's overall.   Baseline:  Goal status: INITIAL    LONG TERM GOALS: Target date: 01/27/2024  Pt will be able to sleep at least 5/7 nights/wk without pain disturbance.  Baseline:  Goal status: INITIAL  2.  Pt will report at least a 70% improvement overall in pain and sx's.   Baseline:  Goal status: INITIAL  3.  Pt will demo improved core strength by progressing with core exercises without adverse effects for improved core stability with functional mobility, daily activities, and golf.  Baseline:  Goal status: INITIAL  4.  Pt will be able to reach overhead and laterally without significant shoulder pain.  Baseline:  Goal status: INITIAL  5.  Pt will report at least a 70% reduction in pain with R rotation for improved pain with playing golf.  Baseline:  Goal status: INITIAL    PLAN:  PT FREQUENCY: 2x/week  PT DURATION: 6 weeks  PLANNED INTERVENTIONS: 97164- PT Re-evaluation, 97110-Therapeutic exercises, 97530- Therapeutic activity, 97112- Neuromuscular re-education, 97535- Self Care, 40102- Manual therapy, [phone]- Gait training, [phone]- Aquatic Therapy, [phone]- Ultrasound, Patient/Family education, Balance training, Stair training, Taping, Dry Needling, Joint mobilization, Spinal mobilization, Cryotherapy, and Moist heat.  PLAN FOR NEXT SESSION:  recommend continuing with periscapular and core strengthening, challenging rotation and antirotation stability within pt tolerance to work towards his goal of improved comfort w/ golf.    Ashley Murrain PT, DPT 12/24/2023 11:47 AM

## 2023-12-24 ENCOUNTER — Ambulatory Visit (HOSPITAL_BASED_OUTPATIENT_CLINIC_OR_DEPARTMENT_OTHER): Payer: PPO | Attending: Sports Medicine | Admitting: Physical Therapy

## 2023-12-24 ENCOUNTER — Encounter (HOSPITAL_BASED_OUTPATIENT_CLINIC_OR_DEPARTMENT_OTHER): Payer: Self-pay | Admitting: Physical Therapy

## 2023-12-24 DIAGNOSIS — M25512 Pain in left shoulder: Secondary | ICD-10-CM | POA: Insufficient documentation

## 2023-12-24 DIAGNOSIS — M546 Pain in thoracic spine: Secondary | ICD-10-CM | POA: Insufficient documentation

## 2023-12-24 DIAGNOSIS — M25511 Pain in right shoulder: Secondary | ICD-10-CM | POA: Diagnosis not present

## 2023-12-24 DIAGNOSIS — M6281 Muscle weakness (generalized): Secondary | ICD-10-CM | POA: Diagnosis not present

## 2023-12-24 DIAGNOSIS — M5459 Other low back pain: Secondary | ICD-10-CM | POA: Insufficient documentation

## 2023-12-26 ENCOUNTER — Ambulatory Visit: Payer: PPO | Admitting: Family

## 2023-12-26 ENCOUNTER — Encounter: Payer: Self-pay | Admitting: Family

## 2023-12-26 VITALS — BP 132/74 | HR 67 | Temp 98.2°F | Ht 71.0 in | Wt 234.2 lb

## 2023-12-26 DIAGNOSIS — Z0000 Encounter for general adult medical examination without abnormal findings: Secondary | ICD-10-CM | POA: Diagnosis not present

## 2023-12-26 NOTE — Patient Instructions (Signed)
 It was very nice to see you today!   I will review your lab results via MyChart in a few days.   Stay well, keep exercising, enjoy the Spring!       PLEASE NOTE:  If you had any lab tests please let us know if you have not heard back within a few days. You may see your results on MyChart before we have a chance to review them but we will give you a call once they are reviewed by [place]. If we ordered any referrals today, please let us know if you have not heard from their office within the next week.

## 2023-12-26 NOTE — Progress Notes (Signed)
 Phone: [phone]  Subjective:  Patient 68 y.o. male presenting for annual physical.  Chief Complaint  Patient presents with   Annual Exam    Pt is present for Annual exam is not fasting today.   See problem oriented charting- ROS- full  review of systems was completed and negative.  The following were reviewed and entered/updated in epic: Past Medical History:  Diagnosis Date   Cardiomyopathy, nonischemic (HCC)    Diverticulosis of colon    ED (erectile dysfunction)    Gout    Hearing loss    Hx of colonic polyps    Hypercholesteremia 12/05/2018   Hyperlipidemia    Nasal septal deviation    Obesity    OSA (obstructive sleep apnea)    PAF (paroxysmal atrial fibrillation) (HCC)    Pulmonary sarcoidosis (HCC)  1997    biopsy proven   Patient Active Problem List   Diagnosis Date Noted   Chronic low back pain 03/24/2021   Low testosterone 03/24/2021   Meniere's disease 03/23/2020   Acquired nasal septal defect 10/06/2019   CVA (cerebrovascular accident) (HCC) 11/14/2018   Numbness and tingling of foot 10/20/2018   Insomnia 10/20/2018   Erectile dysfunction 10/20/2018   Anal fissure 10/20/2018   Cold sore 10/20/2018   Osteoarthritis 04/20/2014   Allergic rhinitis 05/29/2012   OSA (obstructive sleep apnea) 06/04/2011   Pulmonary sarcoidosis (HCC) 01/11/2011   Gout 11/11/2009   Diverticulosis of large intestine 11/11/2009   History of colonic polyps 11/11/2009   Dyslipidemia with statin intolerance 10/24/2009   Deafness, left 08/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   Past Surgical History:  Procedure Laterality Date   CATARACT EXTRACTION     HERNIA REPAIR  2009   LUNG BIOPSY  1997   TEE WITHOUT CARDIOVERSION N/A 11/25/2018   Procedure: TRANSESOPHAGEAL ECHOCARDIOGRAM (TEE);  Surgeon: Elder Negus, MD;  Location: [facility] ENDOSCOPY;  Service: Cardiovascular;  Laterality: N/A;   TONSILLECTOMY  1979    Family History  Problem Relation Age of Onset   COPD Father        was a smoker    Arthritis Father    Asthma Sister    Cancer Sister    Alcohol abuse Brother     Medications- reviewed and updated Current Outpatient Medications  Medication Sig Dispense Refill   albuterol (PROAIR HFA) 108 (90 Base) MCG/ACT inhaler INHALE 2 PUFFS INTO THE LUNGS EVERY 6 (SIX) HOURS AS NEEDED FOR WHEEZING. 8.5 each 4   Ascorbic Acid (VITAMIN C) 1000 MG tablet Take 1,000 mg by mouth daily.     Cyanocobalamin (VITAMIN B 12 PO) Take 1 tablet by mouth daily.     diphenhydrAMINE (BENADRYL) 25 MG tablet Take 25 mg by mouth every 6 (six) hours as needed for allergies.     hydrocortisone (ANUSOL-HC) 2.5 % rectal cream Place 1 application rectally 2 (two) times daily as needed for hemorrhoids (itching).     ibuprofen (ADVIL) 200 MG tablet Take 200 mg by mouth as needed.     sildenafil (VIAGRA) 100 MG tablet TAKE 1/2 TO 1 TABLET BY MOUTH DAILY AS NEEDED FOR ERECTILE DYSFUNCTION 5 tablet 10   vitamin E 100 UNIT capsule Take 100 Units by mouth daily.     zolpidem (AMBIEN) 10 MG tablet Take 0.5-1 tablets (5-10 mg total) by mouth at bedtime as needed for sleep. 30 tablet 0   fluticasone (FLONASE) 50 MCG/ACT nasal spray Place 1 spray into both nostrils as needed. (Patient not taking: Reported on 12/26/2023)     valACYclovir (  VALTREX) 1000 MG tablet Take 1 g by mouth as directed. Take 1 tablet daily for 3-4 days as needed for flare ups (Patient not taking: Reported on 12/26/2023)  2   No current facility-administered medications for this visit.    Allergies-reviewed and updated Allergies  Allergen Reactions   Ciprofloxacin Rash   Statins     Muscle pain and generalized weakness.     Social History   Social History Narrative   Lives with wife in a one story home.  No children.  Works for [employer].  Education: college.     Objective:  BP 132/74   Pulse 67   Temp 98.2 F (36.8 C) (Temporal)   Ht 5\' 11"  (1.803 m)   Wt 234 lb 3.2 oz (106.2 kg)   SpO2 98%   BMI 32.66 kg/m  Physical Exam Vitals and  nursing note reviewed.  Constitutional:      General: He is not in acute distress.    Appearance: Normal appearance.  HENT:     Head: Normocephalic.     Right Ear: Tympanic membrane and external ear normal.     Left Ear: Tympanic membrane and external ear normal.     Nose: Nose normal.     Mouth/Throat:     Mouth: Mucous membranes are moist.  Eyes:     Extraocular Movements: Extraocular movements intact.  Cardiovascular:     Rate and Rhythm: Normal rate and regular rhythm.  Pulmonary:     Effort: Pulmonary effort is normal.     Breath sounds: Normal breath sounds.  Abdominal:     General: Abdomen is flat. There is no distension.     Palpations: Abdomen is soft.     Tenderness: There is no abdominal tenderness.  Musculoskeletal:        General: Normal range of motion.     Cervical back: Normal range of motion.  Skin:    General: Skin is warm and dry.  Neurological:     Mental Status: He is alert and oriented to person, place, and time.  Psychiatric:        Mood and Affect: Mood normal.        Behavior: Behavior normal.        Judgment: Judgment normal.      Assessment and Plan   Health Maintenance counseling: 1. Anticipatory guidance: Patient counseled regarding regular dental exams q6 months, eye exams yearly, avoiding smoking and second hand smoke, limiting alcohol to 2 beverages per day.   2. Risk factor reduction:  Advised patient of need for regular exercise and diet rich in fruits and vegetables to reduce risk of heart attack and stroke. Exercise-  3d/week and walks other days.   Wt Readings from Last 3 Encounters:  12/26/23 234 lb 3.2 oz (106.2 kg)  09/23/23 225 lb (102.1 kg)  09/17/23 224 lb 12.8 oz (102 kg)   3. Immunizations/screenings/ancillary studies Immunization History  Administered Date(s) Administered   Hep A, Unspecified 06/18/2003   Hepatitis A, Adult 06/09/2007   Tdap 06/09/2007   Typhoid Inactivated 06/18/2003   Typhoid Live 06/09/2007    Health Maintenance Due  Topic Date Due   Medicare Annual Wellness (AWV)  Never done   COVID-19 Vaccine (1 - 2024-25 season) Never done    4. Prostate cancer screening >55yo - risk factors?  Lab Results  Component Value Date   PSA 2.97 12/26/2023   PSA 2.02 07/19/2022   PSA 2.03 03/27/2021    5. Colon cancer screening:  done 2024 6. Skin cancer screening-  advised regular sunscreen use. Denies worrisome, changing, or new skin lesions.  7. Smoking associated screening (lung cancer screening, AAA screen 65-75, UA)-  never-smoker 8. STD screening - N/A 9. Alcohol screening: 2 glasses wine with dinner nightly  Annual physical exam -     TSH -     Lipid panel -     CBC with Differential/Platelet -     Comprehensive metabolic panel with GFR -     PSA   Recommended follow up:  No follow-ups on file. Future Appointments  Date Time Provider Department Center  01/17/2024 11:00 AM Susy Manor, [place] [facility]  01/21/2024 10:15 AM Ashley Murrain, PT [facility]  01/24/2024 11:00 AM Susy Manor, PT [facility]  01/28/2024 10:15 AM Benjie Karvonen April Ma L, PT [facility]  01/31/2024 11:00 AM Susy Manor, PT [facility]  12/29/2024  7:40 AM Jimmey Ralph Katina Degree, MD [facility]     Lab/Order associations: fasting    Dulce Sellar, NP

## 2023-12-26 NOTE — Therapy (Signed)
 OUTPATIENT PHYSICAL THERAPY TREATMENT   Patient Name: Jason Weaver MRN: [id number] DOB:30-May-1956, 68 y.o., male Today's Date: 12/27/2023  END OF SESSION:  PT End of Session - 12/27/23 1019     Visit Number 4    Number of Visits 12    Date for PT Re-Evaluation 01/27/24    Authorization Type HTA    PT Start Time 1018    PT Stop Time 1057    PT Time Calculation (min) 39 min    Activity Tolerance Patient tolerated treatment well                Past Medical History:  Diagnosis Date   Cardiomyopathy, nonischemic (HCC)    Diverticulosis of colon    ED (erectile dysfunction)    Gout    Hearing loss    Hx of colonic polyps    Hypercholesteremia 12/05/2018   Hyperlipidemia    Nasal septal deviation    Obesity    OSA (obstructive sleep apnea)    PAF (paroxysmal atrial fibrillation) (HCC)    Pulmonary sarcoidosis (HCC)  1997    biopsy proven   Past Surgical History:  Procedure Laterality Date   CATARACT EXTRACTION     HERNIA REPAIR  2009   LUNG BIOPSY  1997   TEE WITHOUT CARDIOVERSION N/A 11/25/2018   Procedure: TRANSESOPHAGEAL ECHOCARDIOGRAM (TEE);  Surgeon: Elder Negus, MD;  Location: [facility] ENDOSCOPY;  Service: Cardiovascular;  Laterality: N/A;   TONSILLECTOMY  1979   Patient Active Problem List   Diagnosis Date Noted   Chronic low back pain 03/24/2021   Low testosterone 03/24/2021   Meniere's disease 03/23/2020   Acquired nasal septal defect 10/06/2019   CVA (cerebrovascular accident) (HCC) 11/14/2018   Numbness and tingling of foot 10/20/2018   Insomnia 10/20/2018   Erectile dysfunction 10/20/2018   Anal fissure 10/20/2018   Cold sore 10/20/2018   Osteoarthritis 04/20/2014   Allergic rhinitis 05/29/2012   OSA (obstructive sleep apnea) 06/04/2011   Pulmonary sarcoidosis (HCC) 01/11/2011   Gout 11/11/2009   Diverticulosis of large intestine 11/11/2009   History of colonic polyps 11/11/2009   Dyslipidemia with statin intolerance 10/24/2009    Deafness, left 08/23/2009     REFERRING PROVIDER: Richardean Sale, DO  REFERRING DIAG: M54.50,G89.29 (ICD-10-CM) - Chronic right-sided low back pain without sciatica M51.35 (ICD-10-CM) - DDD (degenerative disc disease), thoracolumbar M54.6,G89.29 (ICD-10-CM) - Chronic right-sided thoracic back pain M25.511,G89.29,M25.512 (ICD-10-CM) - Chronic pain of both shoulders  Rationale for Evaluation and Treatment: Rehabilitation  THERAPY DIAG:  Other low back pain  Pain in thoracic spine  Bilateral shoulder pain, unspecified chronicity  Muscle weakness (generalized)  ONSET DATE: 09/23/2023  PT order  SUBJECTIVE:  SUBJECTIVE STATEMENT: Shoulders feeling much better per pt report. Back feeling "slightly improved". Feels rotational exercises last session were helpful.     PERTINENT HISTORY:  Chronic back pain R THR with posterolateral approach in 09/2022 OA CVA in 2020, left ear deafness, vestibular issues on L side  Pulmonary sarcoidosis    PAIN:  5/10 with rotation ;  Currently, 7-8/10 worst, 2-3/10 best Location:  central and R sided lower thoracic to upper lumbar  0/10 current, 10/10 worst, 0/10 best Location:  bilat shoulders, R > L  Pt is R handed dominant  PRECAUTIONS: Other: R THR with posterolateral approach 12/23, CVA, vestibular issues on L side, pulmonary sarcoidosis   WEIGHT BEARING RESTRICTIONS: No  FALLS:  Has patient fallen in last 6 months? No  LIVING ENVIRONMENT: Lives with: lives with their spouse Lives in: 1 story home Stairs: 2 steps to enter without rail.  Pt has a ramp also.   OCCUPATION: Pt is retired.  PLOF: Independent.  Pt typically plays a couple of rounds a golf per week.  Pt works out it in [employer] 3 days per week.   PATIENT GOALS: to not have pain with  activities, to play golf without pain and stiffness   OBJECTIVE:  Note: Objective measures were completed at Evaluation unless otherwise noted.  DIAGNOSTIC FINDINGS:  X rays on 09/23/2023: FINDINGS: There is no evidence of lumbar spine fracture. Alignment is normal. Degenerative changes at each lumbar level with marginal osteophytes and disc space narrowing. Findings most severe at L5-S1.   IMPRESSION: Degenerative changes. No acute osseous abnormalities.  PA note indicated No acute fx or dislocation. decreased anterior height of T11 vertebral body with a hx of fall 2 months ago.  multiple areas of anterior vertebral spurring t/o thoracolumbar spine.    PALPATION: Pt has moderate + tightness in lower thoracic and upper lumbar paraspinals and moderate tightness in mid to lower lumbar paraspinals.  Pt had no tenderness with palpatoin  LUMBAR / Thoracic ROM:   AROM Lumbar Thoracic 2/27  Flexion [facility] WFL  Extension WFL 50%  Right lateral flexion 90% WFL  Left lateral flexion 60% WFL  Right rotation WFL 75%  Left rotation WFL 75%   (Blank rows = not tested) Pt reports some tightness with lumbar ROM though no pain.   UPPER EXTREMITY ROM:     Active  Right eval Left eval Right 2/27 Left 2/27  Shoulder flexion 157 174    Shoulder scaption 163 175    Shoulder abduction 148 168    Shoulder ER   81 71  Shoulder IR   67 79                                             (Blank rows = not tested)  MMT:    MMT Right eval Left eval Right 2/27 Left 2/27  Hip flexion 5/5 5/5    Hip extension      Hip abduction      Shoulder flexion 5/5 5/5    Shoulder abduction 5/5 5/5    Shoulder scaption 5/5 5/5    Knee flexion      Knee extension 5/5 5/5    Ankle dorsiflexion      Ankle plantarflexion      Shoulder ER   4+/5 with pain 5/5 with slight pain  Shoulder IR   5/5  5/5   (Blank rows = not tested)  LUMBAR SPECIAL TESTS:   Supine SLR test: negative bilat HS  flexibility:  WFL    TREATMENT DATE:                                                                                                                               [facility] Adult PT Treatment:                                                DATE: 12/27/23 Therapeutic Exercise: Standing short lever open books x12 BIL cues for UE positioning  Cat/camel 2x8 cues for reduced compensations and initial setup Cervicothoracic rotation in quadruped x6 BIL  Suitcase carry 2x159ft BIL w/ 8# cues for posture HEP update + education  Neuromuscular re-ed: Green band paloff x12 BIL Red band paloff + OH press 2x8 BIL cues for comfortable ROM and posture  Staggered stance green band chest press x15 BIL  Staggered stance high>low green band row 2x12 BIL     [facility] Adult PT Treatment:                                                DATE: 12/24/23 Therapeutic Exercise: Short lever open books x10 BIL  Standing short lever open books x10 BIL HEP handout + education  Neuromuscular re-ed: Single arm rotational row green band, staggered stance x8 BIL cues for posture, form, scapular mechanics and end range stretch into protraction Green band paloff press x10 BIL cues for posture/pacing Standing red band shoulder ext on airex 2x10 cues for posture High>low red band single arm row x8    PATIENT EDUCATION:  Education details: rationale for interventions, HEP  Person educated: Patient Education method: Explanation, Demonstration, Tactile cues, Verbal cues Education comprehension: verbalized understanding, returned demonstration, verbal cues required, tactile cues required, and needs further education     HOME EXERCISE PROGRAM: Access Code: [email or web address] URL: [email or web address] Date: 12/27/2023 Prepared by: Fransisco Hertz  Exercises - Log Roll  - Supine Transversus Abdominis Bracing - Hands on Stomach  - 2 x daily - 7 x weekly - 2 sets - 10 reps - 5 seconds hold - Sidelying Open Book Thoracic Lumbar  Rotation  - 2 x daily - 7 x weekly - 1-2 sets - 10 reps - Standing Anti-Rotation Press with Anchored Resistance  - 5 x weekly - 1-2 sets - 8 reps - Chest Press with Anchored Resistance  - 5 x weekly - 1-2 sets - 8 reps - Standing Shoulder Row with Anchored Resistance  - 1 x daily - 5 x weekly - 2 sets - 10 reps    ASSESSMENT:  CLINICAL IMPRESSION: Pt  arrives w/ report of improvement in shoulder pain and modest improvement in back pain after last session. Today we are able to progress for increased complexity of core stabilization training with emphasis on anti rotational movements for now. Also able to progress scapular/spinal mobility into quadruped positions. Tolerates well overall, some muscle fatigue as expected but reports improving stiffness and no increase in pain. No adverse events. Recommend continuing along current POC in order to address relevant deficits and improve functional tolerance. Pt departs today's session in no acute distress, all voiced questions/concerns addressed appropriately from PT perspective.      OBJECTIVE IMPAIRMENTS: decreased activity tolerance, decreased endurance, decreased mobility, decreased ROM, decreased strength, hypomobility, increased fascial restrictions, impaired UE functional use, and pain.   ACTIVITY LIMITATIONS: lifting, sleeping, bed mobility, and reaching laterally  PARTICIPATION LIMITATIONS:  golf  PERSONAL FACTORS: Time since onset of injury/illness/exacerbation and 3+ comorbidities: L THR, OA, CVA, pulmonary sarcoidosis and vestibular issues on L side   are also affecting patient's functional outcome.   REHAB POTENTIAL: Good  CLINICAL DECISION MAKING: Evolving/moderate complexity  EVALUATION COMPLEXITY: Moderate   GOALS:  SHORT TERM GOALS: Target date: 01/06/2024   Pt will be independent and compliant with HEP for improved pain, ROM, strength, and function.  Baseline: Goal status: INITIAL  2.  Pt will demo lumbar SB'ing AROM to  be [facility] bilat for improved stiffness and mobility Baseline:  Goal status: INITIAL  3.  Pt will report at least a 25% improvement in pain and sx's overall.   Baseline:  Goal status: INITIAL    LONG TERM GOALS: Target date: 01/27/2024  Pt will be able to sleep at least 5/7 nights/wk without pain disturbance.  Baseline:  Goal status: INITIAL  2.  Pt will report at least a 70% improvement overall in pain and sx's.   Baseline:  Goal status: INITIAL  3.  Pt will demo improved core strength by progressing with core exercises without adverse effects for improved core stability with functional mobility, daily activities, and golf.  Baseline:  Goal status: INITIAL  4.  Pt will be able to reach overhead and laterally without significant shoulder pain.  Baseline:  Goal status: INITIAL  5.  Pt will report at least a 70% reduction in pain with R rotation for improved pain with playing golf.  Baseline:  Goal status: INITIAL    PLAN:  PT FREQUENCY: 2x/week  PT DURATION: 6 weeks  PLANNED INTERVENTIONS: 97164- PT Re-evaluation, 97110-Therapeutic exercises, 97530- Therapeutic activity, 97112- Neuromuscular re-education, 97535- Self Care, 16109- Manual therapy, [phone]- Gait training, [phone]- Aquatic Therapy, [phone]- Ultrasound, Patient/Family education, Balance training, Stair training, Taping, Dry Needling, Joint mobilization, Spinal mobilization, Cryotherapy, and Moist heat.  PLAN FOR NEXT SESSION:  recommend continuing with periscapular and core strengthening, challenging rotation and antirotation stability within pt tolerance to work towards his goal of improved comfort w/ golf.    Ashley Murrain PT, DPT 12/27/2023 11:00 AM

## 2023-12-27 ENCOUNTER — Encounter (HOSPITAL_BASED_OUTPATIENT_CLINIC_OR_DEPARTMENT_OTHER): Payer: Self-pay | Admitting: Physical Therapy

## 2023-12-27 ENCOUNTER — Ambulatory Visit (HOSPITAL_BASED_OUTPATIENT_CLINIC_OR_DEPARTMENT_OTHER): Payer: PPO | Admitting: Physical Therapy

## 2023-12-27 DIAGNOSIS — M546 Pain in thoracic spine: Secondary | ICD-10-CM

## 2023-12-27 DIAGNOSIS — M6281 Muscle weakness (generalized): Secondary | ICD-10-CM

## 2023-12-27 DIAGNOSIS — M5459 Other low back pain: Secondary | ICD-10-CM

## 2023-12-27 DIAGNOSIS — M25511 Pain in right shoulder: Secondary | ICD-10-CM

## 2023-12-27 LAB — COMPREHENSIVE METABOLIC PANEL
ALT: 19 U/L (ref 0–53)
AST: 15 U/L (ref 0–37)
Albumin: 4.5 g/dL (ref 3.5–5.2)
Alkaline Phosphatase: 76 U/L (ref 39–117)
BUN: 22 mg/dL (ref 6–23)
CO2: 22 meq/L (ref 19–32)
Calcium: 10 mg/dL (ref 8.4–10.5)
Chloride: 107 meq/L (ref 96–112)
Creatinine, Ser: 1.09 mg/dL (ref 0.40–1.50)
GFR: 71.04 mL/min (ref >60.00)
Glucose, Bld: 88 mg/dL (ref 70–99)
Potassium: 4.3 meq/L (ref 3.5–5.1)
Sodium: 141 meq/L (ref 135–145)
Total Bilirubin: 0.6 mg/dL (ref 0.2–1.2)
Total Protein: 7.2 g/dL (ref 6.0–8.3)

## 2023-12-27 LAB — CBC WITH DIFFERENTIAL/PLATELET
Basophils Absolute: 0.1 10*3/uL (ref 0.0–0.1)
Basophils Relative: 0.9 % (ref 0.0–3.0)
Eosinophils Absolute: 0.8 10*3/uL — ABNORMAL HIGH (ref 0.0–0.7)
Eosinophils Relative: 9.6 % — ABNORMAL HIGH (ref 0.0–5.0)
HCT: 47.4 % (ref 39.0–52.0)
Hemoglobin: 16.2 g/dL (ref 13.0–17.0)
Lymphocytes Relative: 10.6 % — ABNORMAL LOW (ref 12.0–46.0)
Lymphs Abs: 0.8 10*3/uL (ref 0.7–4.0)
MCHC: 34.2 g/dL (ref 30.0–36.0)
MCV: 92.8 fl (ref 78.0–100.0)
Monocytes Absolute: 1.1 10*3/uL — ABNORMAL HIGH (ref 0.1–1.0)
Monocytes Relative: 14.4 % — ABNORMAL HIGH (ref 3.0–12.0)
Neutro Abs: 5.1 10*3/uL (ref 1.4–7.7)
Neutrophils Relative %: 64.5 % (ref 43.0–77.0)
Platelets: 244 10*3/uL (ref 150.0–400.0)
RBC: 5.1 Mil/uL (ref 4.22–5.81)
RDW: 14.5 % (ref 11.5–15.5)
WBC: 7.9 10*3/uL (ref 4.0–10.5)

## 2023-12-27 LAB — PSA: PSA: 2.97 ng/mL (ref 0.10–4.00)

## 2023-12-27 LAB — LIPID PANEL
Cholesterol: 256 mg/dL — ABNORMAL HIGH (ref 0–200)
HDL: 51.1 mg/dL (ref >39.00)
LDL Cholesterol: 132 mg/dL — ABNORMAL HIGH (ref 0–99)
NonHDL: 205.04
Total CHOL/HDL Ratio: 5
Triglycerides: 366 mg/dL — ABNORMAL HIGH (ref 0.0–149.0)
VLDL: 73.2 mg/dL — ABNORMAL HIGH (ref 0.0–40.0)

## 2023-12-27 LAB — TSH: TSH: 3.89 u[IU]/mL (ref 0.35–5.50)

## 2023-12-30 ENCOUNTER — Encounter: Payer: Self-pay | Admitting: Family

## 2023-12-31 ENCOUNTER — Encounter (HOSPITAL_BASED_OUTPATIENT_CLINIC_OR_DEPARTMENT_OTHER): Payer: Self-pay | Admitting: Physical Therapy

## 2023-12-31 ENCOUNTER — Ambulatory Visit (HOSPITAL_BASED_OUTPATIENT_CLINIC_OR_DEPARTMENT_OTHER): Payer: PPO | Admitting: Physical Therapy

## 2023-12-31 DIAGNOSIS — M6281 Muscle weakness (generalized): Secondary | ICD-10-CM

## 2023-12-31 DIAGNOSIS — M546 Pain in thoracic spine: Secondary | ICD-10-CM

## 2023-12-31 DIAGNOSIS — M25511 Pain in right shoulder: Secondary | ICD-10-CM

## 2023-12-31 DIAGNOSIS — M5459 Other low back pain: Secondary | ICD-10-CM | POA: Diagnosis not present

## 2023-12-31 NOTE — Therapy (Signed)
 OUTPATIENT PHYSICAL THERAPY TREATMENT   Patient Name: Jason Weaver MRN: [id number] DOB:1956/03/04, 68 y.o., male Today's Date: 12/31/2023  END OF SESSION:  PT End of Session - 12/31/23 1035     Visit Number 5    Number of Visits 12    Date for PT Re-Evaluation 01/27/24    Authorization Type HTA    PT Start Time 1023    PT Stop Time 1102    PT Time Calculation (min) 39 min    Activity Tolerance Patient tolerated treatment well    Behavior During Therapy [facility] for tasks assessed/performed                 Past Medical History:  Diagnosis Date   Cardiomyopathy, nonischemic (HCC)    Diverticulosis of colon    ED (erectile dysfunction)    Gout    Hearing loss    Hx of colonic polyps    Hypercholesteremia 12/05/2018   Hyperlipidemia    Nasal septal deviation    Obesity    OSA (obstructive sleep apnea)    PAF (paroxysmal atrial fibrillation) (HCC)    Pulmonary sarcoidosis (HCC)  1997    biopsy proven   Past Surgical History:  Procedure Laterality Date   CATARACT EXTRACTION     HERNIA REPAIR  2009   LUNG BIOPSY  1997   TEE WITHOUT CARDIOVERSION N/A 11/25/2018   Procedure: TRANSESOPHAGEAL ECHOCARDIOGRAM (TEE);  Surgeon: Elder Negus, MD;  Location: [facility] ENDOSCOPY;  Service: Cardiovascular;  Laterality: N/A;   TONSILLECTOMY  1979   Patient Active Problem List   Diagnosis Date Noted   Chronic low back pain 03/24/2021   Low testosterone 03/24/2021   Meniere's disease 03/23/2020   Acquired nasal septal defect 10/06/2019   CVA (cerebrovascular accident) (HCC) 11/14/2018   Numbness and tingling of foot 10/20/2018   Insomnia 10/20/2018   Erectile dysfunction 10/20/2018   Anal fissure 10/20/2018   Cold sore 10/20/2018   Osteoarthritis 04/20/2014   Allergic rhinitis 05/29/2012   OSA (obstructive sleep apnea) 06/04/2011   Pulmonary sarcoidosis (HCC) 01/11/2011   Gout 11/11/2009   Diverticulosis of large intestine 11/11/2009   History of colonic polyps  11/11/2009   Dyslipidemia with statin intolerance 10/24/2009   Deafness, left 08/23/2009     REFERRING PROVIDER: Richardean Sale, DO  REFERRING DIAG: M54.50,G89.29 (ICD-10-CM) - Chronic right-sided low back pain without sciatica M51.35 (ICD-10-CM) - DDD (degenerative disc disease), thoracolumbar M54.6,G89.29 (ICD-10-CM) - Chronic right-sided thoracic back pain M25.511,G89.29,M25.512 (ICD-10-CM) - Chronic pain of both shoulders  Rationale for Evaluation and Treatment: Rehabilitation  THERAPY DIAG:  Other low back pain  Pain in thoracic spine  Bilateral shoulder pain, unspecified chronicity  Muscle weakness (generalized)  ONSET DATE: 09/23/2023  PT order  SUBJECTIVE:  SUBJECTIVE STATEMENT: Pt denies any adverse effects after prior Rx.  Pt reports improved flexibility.  Pt states he played golf yesterday and Sunday and feels "super tight" today.  He reports no increased pain.  Pt reports compliance with HEP.  "I felt more normal than I have in a long time, at least half the round."  Pt states his shoulders are feeling better also.      PERTINENT HISTORY:  Chronic back pain R THR with posterolateral approach in 09/2022 OA CVA in 2020, left ear deafness, vestibular issues on L side  Pulmonary sarcoidosis    PAIN:  No pain, just tightness with rotation currently, 7-8/10 worst, 2-3/10 best Location:  central and R sided lower thoracic to upper lumbar  0/10 current, 10/10 worst, 0/10 best Location:  bilat shoulders, R > L  Pt is R handed dominant  PRECAUTIONS: Other: R THR with posterolateral approach 12/23, CVA, vestibular issues on L side, pulmonary sarcoidosis   WEIGHT BEARING RESTRICTIONS: No  FALLS:  Has patient fallen in last 6 months? No  LIVING ENVIRONMENT: Lives with: lives  with their spouse Lives in: 1 story home Stairs: 2 steps to enter without rail.  Pt has a ramp also.   OCCUPATION: Pt is retired.  PLOF: Independent.  Pt typically plays a couple of rounds a golf per week.  Pt works out it in [employer] 3 days per week.   PATIENT GOALS: to not have pain with activities, to play golf without pain and stiffness   OBJECTIVE:  Note: Objective measures were completed at Evaluation unless otherwise noted.  DIAGNOSTIC FINDINGS:  X rays on 09/23/2023: FINDINGS: There is no evidence of lumbar spine fracture. Alignment is normal. Degenerative changes at each lumbar level with marginal osteophytes and disc space narrowing. Findings most severe at L5-S1.   IMPRESSION: Degenerative changes. No acute osseous abnormalities.  PA note indicated No acute fx or dislocation. decreased anterior height of T11 vertebral body with a hx of fall 2 months ago.  multiple areas of anterior vertebral spurring t/o thoracolumbar spine.    PALPATION: Pt has moderate + tightness in lower thoracic and upper lumbar paraspinals and moderate tightness in mid to lower lumbar paraspinals.  Pt had no tenderness with palpatoin  LUMBAR / Thoracic ROM:   AROM Lumbar Thoracic 2/27  Flexion [facility] WFL  Extension WFL 50%  Right lateral flexion 90% WFL  Left lateral flexion 60% WFL  Right rotation WFL 75%  Left rotation WFL 75%   (Blank rows = not tested) Pt reports some tightness with lumbar ROM though no pain.   UPPER EXTREMITY ROM:     Active  Right eval Left eval Right 2/27 Left 2/27  Shoulder flexion 157 174    Shoulder scaption 163 175    Shoulder abduction 148 168    Shoulder ER   81 71  Shoulder IR   67 79                                             (Blank rows = not tested)  MMT:    MMT Right eval Left eval Right 2/27 Left 2/27  Hip flexion 5/5 5/5    Hip extension      Hip abduction      Shoulder flexion 5/5 5/5    Shoulder abduction 5/5 5/5     Shoulder scaption  5/5 5/5    Knee flexion      Knee extension 5/5 5/5    Ankle dorsiflexion      Ankle plantarflexion      Shoulder ER   4+/5 with pain 5/5 with slight pain  Shoulder IR   5/5 5/5   (Blank rows = not tested)  LUMBAR SPECIAL TESTS:   Supine SLR test: negative bilat HS flexibility:  WFL    TREATMENT DATE:                                                                                                                               [facility] Adult PT Treatment:                                                DATE: 12/31/23 Therapeutic Exercise: S/L open book with short lever x 10 reps   Standing short lever open books x10 reps bilat  Q-ped Cat/camel 2x10 Green band paloff press 2x10 bilat with TrA Standing rows in staggered stance with GTB 2x10 with TrA Standing shoulder extension with TrA standing on airex with RTB and GTB x 12 each   Manual Therapy:  Pt received STM to bilat  lower thoracic and lumbar paraspinals in prone     DATE: 12/27/23 Therapeutic Exercise: Standing short lever open books x12 BIL cues for UE positioning  Cat/camel 2x8 cues for reduced compensations and initial setup Cervicothoracic rotation in quadruped x6 BIL  Suitcase carry 2x146ft BIL w/ 8# cues for posture HEP update + education  Neuromuscular re-ed: Green band paloff x12 BIL Red band paloff + OH press 2x8 BIL cues for comfortable ROM and posture  Staggered stance green band chest press x15 BIL  Staggered stance high>low green band row 2x12 BIL     [facility] Adult PT Treatment:                                                DATE: 12/24/23 Therapeutic Exercise: Short lever open books x10 BIL  Standing short lever open books x10 BIL HEP handout + education  Neuromuscular re-ed: Single arm rotational row green band, staggered stance x8 BIL cues for posture, form, scapular mechanics and end range stretch into protraction Green band paloff press x10 BIL cues for posture/pacing Standing red  band shoulder ext on airex 2x10 cues for posture High>low red band single arm row x8    PATIENT EDUCATION:  Education details: rationale for interventions, HEP  Person educated: Patient Education method: Explanation, Demonstration, Tactile cues, Verbal cues Education comprehension: verbalized understanding, returned demonstration, verbal cues required, tactile cues required, and needs further education     HOME EXERCISE PROGRAM: Access  Code: [email or web address] URL: [email or web address] Date: 12/27/2023 Prepared by: Fransisco Hertz  Exercises - Log Roll  - Supine Transversus Abdominis Bracing - Hands on Stomach  - 2 x daily - 7 x weekly - 2 sets - 10 reps - 5 seconds hold - Sidelying Open Book Thoracic Lumbar Rotation  - 2 x daily - 7 x weekly - 1-2 sets - 10 reps - Standing Anti-Rotation Press with Anchored Resistance  - 5 x weekly - 1-2 sets - 8 reps - Chest Press with Anchored Resistance  - 5 x weekly - 1-2 sets - 8 reps - Standing Shoulder Row with Anchored Resistance  - 1 x daily - 5 x weekly - 2 sets - 10 reps    ASSESSMENT:  CLINICAL IMPRESSION: Pt presents to Rx reporting improvements in both shoulders and back.  He played golf yesterday and reports feeling tight today.  PT worked on core and postural strengthening, spinal mobility, and soft tissue mobility.  Pt reports feeling better with short lever open book exercises than long lever due to shoulder pain.  He performed exercises well with cuing and instruction in correct form.  Pt had soft tissue tightness in bilat lumbar and lower thoracic paraspinals and PT performed STM to improve tightness and mobility and reduce myofascial restrictions/adhesions.  Pt responded well to Rx still reporting tightness though improved after Rx.  He should benefit from cont skilled PT to address impairments and goals and to improve overall function.        OBJECTIVE IMPAIRMENTS: decreased activity tolerance, decreased endurance, decreased  mobility, decreased ROM, decreased strength, hypomobility, increased fascial restrictions, impaired UE functional use, and pain.   ACTIVITY LIMITATIONS: lifting, sleeping, bed mobility, and reaching laterally  PARTICIPATION LIMITATIONS:  golf  PERSONAL FACTORS: Time since onset of injury/illness/exacerbation and 3+ comorbidities: L THR, OA, CVA, pulmonary sarcoidosis and vestibular issues on L side   are also affecting patient's functional outcome.   REHAB POTENTIAL: Good  CLINICAL DECISION MAKING: Evolving/moderate complexity  EVALUATION COMPLEXITY: Moderate   GOALS:  SHORT TERM GOALS: Target date: 01/06/2024   Pt will be independent and compliant with HEP for improved pain, ROM, strength, and function.  Baseline: Goal status: INITIAL  2.  Pt will demo lumbar SB'ing AROM to be [facility] bilat for improved stiffness and mobility Baseline:  Goal status: INITIAL  3.  Pt will report at least a 25% improvement in pain and sx's overall.   Baseline:  Goal status: INITIAL    LONG TERM GOALS: Target date: 01/27/2024  Pt will be able to sleep at least 5/7 nights/wk without pain disturbance.  Baseline:  Goal status: INITIAL  2.  Pt will report at least a 70% improvement overall in pain and sx's.   Baseline:  Goal status: INITIAL  3.  Pt will demo improved core strength by progressing with core exercises without adverse effects for improved core stability with functional mobility, daily activities, and golf.  Baseline:  Goal status: INITIAL  4.  Pt will be able to reach overhead and laterally without significant shoulder pain.  Baseline:  Goal status: INITIAL  5.  Pt will report at least a 70% reduction in pain with R rotation for improved pain with playing golf.  Baseline:  Goal status: INITIAL    PLAN:  PT FREQUENCY: 2x/week  PT DURATION: 6 weeks  PLANNED INTERVENTIONS: 97164- PT Re-evaluation, 97110-Therapeutic exercises, 97530- Therapeutic activity, [id number]  Neuromuscular re-education, 97535- Self Care, 16109- Manual therapy, [id number]- Gait training, [id number]- Aquatic Therapy, [phone]-  Ultrasound, Patient/Family education, Balance training, Stair training, Taping, Dry Needling, Joint mobilization, Spinal mobilization, Cryotherapy, and Moist heat.  PLAN FOR NEXT SESSION:  recommend continuing with periscapular and core strengthening, challenging rotation and antirotation stability within pt tolerance to work towards his goal of improved comfort w/ golf.    Audie Clear III PT, DPT 12/31/23 3:21 PM

## 2024-01-03 ENCOUNTER — Ambulatory Visit (HOSPITAL_BASED_OUTPATIENT_CLINIC_OR_DEPARTMENT_OTHER): Payer: PPO | Admitting: Physical Therapy

## 2024-01-03 ENCOUNTER — Encounter (HOSPITAL_BASED_OUTPATIENT_CLINIC_OR_DEPARTMENT_OTHER): Payer: Self-pay | Admitting: Physical Therapy

## 2024-01-03 DIAGNOSIS — M25511 Pain in right shoulder: Secondary | ICD-10-CM

## 2024-01-03 DIAGNOSIS — M5459 Other low back pain: Secondary | ICD-10-CM | POA: Diagnosis not present

## 2024-01-03 DIAGNOSIS — M6281 Muscle weakness (generalized): Secondary | ICD-10-CM

## 2024-01-03 DIAGNOSIS — M546 Pain in thoracic spine: Secondary | ICD-10-CM

## 2024-01-03 NOTE — Therapy (Signed)
 OUTPATIENT PHYSICAL THERAPY TREATMENT   Patient Name: Jason Weaver MRN: [id number] DOB:12-Aug-1956, 68 y.o., male Today's Date: 01/03/2024  END OF SESSION:  PT End of Session - 01/03/24 1016     Visit Number 6    Number of Visits 12    Date for PT Re-Evaluation 01/27/24    Authorization Type HTA    PT Start Time 1016    PT Stop Time 1100    PT Time Calculation (min) 44 min    Activity Tolerance Patient tolerated treatment well                  Past Medical History:  Diagnosis Date   Cardiomyopathy, nonischemic (HCC)    Diverticulosis of colon    ED (erectile dysfunction)    Gout    Hearing loss    Hx of colonic polyps    Hypercholesteremia 12/05/2018   Hyperlipidemia    Nasal septal deviation    Obesity    OSA (obstructive sleep apnea)    PAF (paroxysmal atrial fibrillation) (HCC)    Pulmonary sarcoidosis (HCC)  1997    biopsy proven   Past Surgical History:  Procedure Laterality Date   CATARACT EXTRACTION     HERNIA REPAIR  2009   LUNG BIOPSY  1997   TEE WITHOUT CARDIOVERSION N/A 11/25/2018   Procedure: TRANSESOPHAGEAL ECHOCARDIOGRAM (TEE);  Surgeon: Elder Negus, MD;  Location: [facility] ENDOSCOPY;  Service: Cardiovascular;  Laterality: N/A;   TONSILLECTOMY  1979   Patient Active Problem List   Diagnosis Date Noted   Chronic low back pain 03/24/2021   Low testosterone 03/24/2021   Meniere's disease 03/23/2020   Acquired nasal septal defect 10/06/2019   CVA (cerebrovascular accident) (HCC) 11/14/2018   Numbness and tingling of foot 10/20/2018   Insomnia 10/20/2018   Erectile dysfunction 10/20/2018   Anal fissure 10/20/2018   Cold sore 10/20/2018   Osteoarthritis 04/20/2014   Allergic rhinitis 05/29/2012   OSA (obstructive sleep apnea) 06/04/2011   Pulmonary sarcoidosis (HCC) 01/11/2011   Gout 11/11/2009   Diverticulosis of large intestine 11/11/2009   History of colonic polyps 11/11/2009   Dyslipidemia with statin intolerance 10/24/2009    Deafness, left 08/23/2009     REFERRING PROVIDER: Richardean Sale, DO  REFERRING DIAG: M54.50,G89.29 (ICD-10-CM) - Chronic right-sided low back pain without sciatica M51.35 (ICD-10-CM) - DDD (degenerative disc disease), thoracolumbar M54.6,G89.29 (ICD-10-CM) - Chronic right-sided thoracic back pain M25.511,G89.29,M25.512 (ICD-10-CM) - Chronic pain of both shoulders  Rationale for Evaluation and Treatment: Rehabilitation  THERAPY DIAG:  Other low back pain  Pain in thoracic spine  Bilateral shoulder pain, unspecified chronicity  Muscle weakness (generalized)  ONSET DATE: 09/23/2023  PT order  SUBJECTIVE:  SUBJECTIVE STATEMENT: Pt states he has been doing pretty well. Pain not quite as bad, has been playing more golf, not having any issues other than continued tightness. Doing HEP every morning.      PERTINENT HISTORY:  Chronic back pain R THR with posterolateral approach in 09/2022 OA CVA in 2020, left ear deafness, vestibular issues on L side  Pulmonary sarcoidosis    PAIN:  No pain, just tightness with rotation currently, 7-8/10 worst, 2-3/10 best   Location:  central and R sided lower thoracic to upper lumbar  0/10 current, 10/10 worst, 0/10 best Location:  bilat shoulders, R > L  Pt is R handed dominant  PRECAUTIONS: Other: R THR with posterolateral approach 12/23, CVA, vestibular issues on L side, pulmonary sarcoidosis   WEIGHT BEARING RESTRICTIONS: No  FALLS:  Has patient fallen in last 6 months? No  LIVING ENVIRONMENT: Lives with: lives with their spouse Lives in: 1 story home Stairs: 2 steps to enter without rail.  Pt has a ramp also.   OCCUPATION: Pt is retired.  PLOF: Independent.  Pt typically plays a couple of rounds a golf per week.  Pt works out it in [employer]  3 days per week.   PATIENT GOALS: to not have pain with activities, to play golf without pain and stiffness   OBJECTIVE:  Note: Objective measures were completed at Evaluation unless otherwise noted.  DIAGNOSTIC FINDINGS:  X rays on 09/23/2023: FINDINGS: There is no evidence of lumbar spine fracture. Alignment is normal. Degenerative changes at each lumbar level with marginal osteophytes and disc space narrowing. Findings most severe at L5-S1.   IMPRESSION: Degenerative changes. No acute osseous abnormalities.  PA note indicated No acute fx or dislocation. decreased anterior height of T11 vertebral body with a hx of fall 2 months ago.  multiple areas of anterior vertebral spurring t/o thoracolumbar spine.    PALPATION: Pt has moderate + tightness in lower thoracic and upper lumbar paraspinals and moderate tightness in mid to lower lumbar paraspinals.  Pt had no tenderness with palpatoin  LUMBAR / Thoracic ROM:   AROM Lumbar Thoracic 2/27 01/03/24 lumbar   Flexion [facility] WFL   Extension WFL 50%   Right lateral flexion 90% WFL Just above knee joint  Left lateral flexion 60% WFL Just above knee joint  Right rotation WFL 75%   Left rotation WFL 75%    (Blank rows = not tested) Pt reports some tightness with lumbar ROM though no pain.   UPPER EXTREMITY ROM:     Active  Right eval Left eval Right 2/27 Left 2/27 R/L 01/03/24  Shoulder flexion 157 174   164 / 172  Shoulder scaption 163 175     Shoulder abduction 148 168     Shoulder ER   81 71   Shoulder IR   67 79                                                     (Blank rows = not tested)  MMT:    MMT Right eval Left eval Right 2/27 Left 2/27  Hip flexion 5/5 5/5    Hip extension      Hip abduction      Shoulder flexion 5/5 5/5    Shoulder abduction 5/5 5/5    Shoulder scaption 5/5 5/5  Knee flexion      Knee extension 5/5 5/5    Ankle dorsiflexion      Ankle plantarflexion      Shoulder ER   4+/5  with pain 5/5 with slight pain  Shoulder IR   5/5 5/5   (Blank rows = not tested)  LUMBAR SPECIAL TESTS:   Supine SLR test: negative bilat HS flexibility:  WFL    TREATMENT DATE:                                                                                                                               [facility] Adult PT Treatment:                                                DATE: 01/03/24 Therapeutic Exercise: Cat/camel 2x10 cues for UE positioning Quadruped CT rotation x12 BIL  Standing thoracic ext w/ foam roll at wall x12 HEP discussion/education  Neuromuscular re-ed: Staggered low>high rotational press red band 2x8 BIL cues for comfortable ROM and motor control  Waist>shoulder red band chest press 2x10 BIL for improved shoulder stability Bird dog x8 BIL (noted inc instability LLE/RUE) Green band paloff + OH press x8 BIL    [facility] Adult PT Treatment:                                                DATE: 12/31/23 Therapeutic Exercise: S/L open book with short lever x 10 reps   Standing short lever open books x10 reps bilat  Q-ped Cat/camel 2x10 Green band paloff press 2x10 bilat with TrA Standing rows in staggered stance with GTB 2x10 with TrA Standing shoulder extension with TrA standing on airex with RTB and GTB x 12 each   Manual Therapy:  Pt received STM to bilat  lower thoracic and lumbar paraspinals in prone     DATE: 12/27/23 Therapeutic Exercise: Standing short lever open books x12 BIL cues for UE positioning  Cat/camel 2x8 cues for reduced compensations and initial setup Cervicothoracic rotation in quadruped x6 BIL  Suitcase carry 2x159ft BIL w/ 8# cues for posture HEP update + education  Neuromuscular re-ed: Green band paloff x12 BIL Red band paloff + OH press 2x8 BIL cues for comfortable ROM and posture  Staggered stance green band chest press x15 BIL  Staggered stance high>low green band row 2x12 BIL     [facility] Adult PT Treatment:  DATE: 12/24/23 Therapeutic Exercise: Short lever open books x10 BIL  Standing short lever open books x10 BIL HEP handout + education  Neuromuscular re-ed: Single arm rotational row green band, staggered stance x8 BIL cues for posture, form, scapular mechanics and end range stretch into protraction Green band paloff press x10 BIL cues for posture/pacing Standing red band shoulder ext on airex 2x10 cues for posture High>low red band single arm row x8    PATIENT EDUCATION:  Education details: rationale for interventions, HEP  Person educated: Patient Education method: Explanation, Demonstration, Tactile cues, Verbal cues Education comprehension: verbalized understanding, returned demonstration, verbal cues required, tactile cues required, and needs further education     HOME EXERCISE PROGRAM: Access Code: [email or web address] URL: [email or web address] Date: 12/27/2023 Prepared by: Fransisco Hertz  Exercises - Log Roll  - Supine Transversus Abdominis Bracing - Hands on Stomach  - 2 x daily - 7 x weekly - 2 sets - 10 reps - 5 seconds hold - Sidelying Open Book Thoracic Lumbar Rotation  - 2 x daily - 7 x weekly - 1-2 sets - 10 reps - Standing Anti-Rotation Press with Anchored Resistance  - 5 x weekly - 1-2 sets - 8 reps - Chest Press with Anchored Resistance  - 5 x weekly - 1-2 sets - 8 reps - Standing Shoulder Row with Anchored Resistance  - 1 x daily - 5 x weekly - 2 sets - 10 reps    ASSESSMENT:  CLINICAL IMPRESSION: Pt arrives w/ tightness but no pain, continues to endorse slow and steady improvement even with increased golf activities this week. Today we progress for increased difficulty with multiplanar stability work and increased volume for familiar mobility work. Tolerates well with some fatigue but no increase in pain. Noted some asymmetrical instability w/ bird dogs as above but no pain. STG addressed below, pt progressing well. No adverse events.  Recommend continuing along current POC in order to address relevant deficits and improve functional tolerance. Pt departs today's session in no acute distress, all voiced questions/concerns addressed appropriately from PT perspective.      OBJECTIVE IMPAIRMENTS: decreased activity tolerance, decreased endurance, decreased mobility, decreased ROM, decreased strength, hypomobility, increased fascial restrictions, impaired UE functional use, and pain.   ACTIVITY LIMITATIONS: lifting, sleeping, bed mobility, and reaching laterally  PARTICIPATION LIMITATIONS:  golf  PERSONAL FACTORS: Time since onset of injury/illness/exacerbation and 3+ comorbidities: L THR, OA, CVA, pulmonary sarcoidosis and vestibular issues on L side   are also affecting patient's functional outcome.   REHAB POTENTIAL: Good  CLINICAL DECISION MAKING: Evolving/moderate complexity  EVALUATION COMPLEXITY: Moderate   GOALS:  SHORT TERM GOALS: Target date: 01/06/2024   Pt will be independent and compliant with HEP for improved pain, ROM, strength, and function.  Baseline: 01/03/24: reports daily HEP adherence Goal status: MET  2.  Pt will demo lumbar SB'ing AROM to be [facility] bilat for improved stiffness and mobility Baseline:  01/03/24: see ROM chart above Goal status: MET  3.  Pt will report at least a 25% improvement in pain and sx's overall.   Baseline:  01/03/24: 70% Goal status: MET    LONG TERM GOALS: Target date: 01/27/2024  Pt will be able to sleep at least 5/7 nights/wk without pain disturbance.  Baseline:  Goal status: INITIAL  2.  Pt will report at least a 70% improvement overall in pain and sx's.   Baseline:  Goal status: INITIAL  3.  Pt will demo improved core strength by progressing with core exercises without  adverse effects for improved core stability with functional mobility, daily activities, and golf.  Baseline:  Goal status: INITIAL  4.  Pt will be able to reach overhead and laterally  without significant shoulder pain.  Baseline:  Goal status: INITIAL  5.  Pt will report at least a 70% reduction in pain with R rotation for improved pain with playing golf.  Baseline:  Goal status: INITIAL    PLAN:  PT FREQUENCY: 2x/week  PT DURATION: 6 weeks  PLANNED INTERVENTIONS: 97164- PT Re-evaluation, 97110-Therapeutic exercises, 97530- Therapeutic activity, 97112- Neuromuscular re-education, 97535- Self Care, 16109- Manual therapy, [phone]- Gait training, [phone]- Aquatic Therapy, [phone]- Ultrasound, Patient/Family education, Balance training, Stair training, Taping, Dry Needling, Joint mobilization, Spinal mobilization, Cryotherapy, and Moist heat.  PLAN FOR NEXT SESSION:  recommend continuing with periscapular and core strengthening, challenging rotation and antirotation stability within pt tolerance to work towards his goal of improved comfort w/ golf.    Ashley Murrain PT, DPT 01/03/2024 11:45 AM

## 2024-01-07 ENCOUNTER — Ambulatory Visit (HOSPITAL_BASED_OUTPATIENT_CLINIC_OR_DEPARTMENT_OTHER): Payer: PPO | Admitting: Physical Therapy

## 2024-01-07 ENCOUNTER — Encounter (HOSPITAL_BASED_OUTPATIENT_CLINIC_OR_DEPARTMENT_OTHER): Payer: Self-pay | Admitting: Physical Therapy

## 2024-01-07 DIAGNOSIS — M546 Pain in thoracic spine: Secondary | ICD-10-CM

## 2024-01-07 DIAGNOSIS — M5459 Other low back pain: Secondary | ICD-10-CM

## 2024-01-07 DIAGNOSIS — M6281 Muscle weakness (generalized): Secondary | ICD-10-CM

## 2024-01-07 DIAGNOSIS — M25511 Pain in right shoulder: Secondary | ICD-10-CM

## 2024-01-07 NOTE — Therapy (Signed)
 OUTPATIENT PHYSICAL THERAPY TREATMENT   Patient Name: Jason Weaver MRN: [id number] DOB:28-Jun-1956, 68 y.o., male Today's Date: 01/07/2024  END OF SESSION:  PT End of Session - 01/07/24 1103     Visit Number 7    Number of Visits 12    Date for PT Re-Evaluation 01/27/24    Authorization Type HTA    PT Start Time 1103    PT Stop Time 1143    PT Time Calculation (min) 40 min    Activity Tolerance Patient tolerated treatment well              Past Medical History:  Diagnosis Date   Cardiomyopathy, nonischemic (HCC)    Diverticulosis of colon    ED (erectile dysfunction)    Gout    Hearing loss    Hx of colonic polyps    Hypercholesteremia 12/05/2018   Hyperlipidemia    Nasal septal deviation    Obesity    OSA (obstructive sleep apnea)    PAF (paroxysmal atrial fibrillation) (HCC)    Pulmonary sarcoidosis (HCC)  1997    biopsy proven   Past Surgical History:  Procedure Laterality Date   CATARACT EXTRACTION     HERNIA REPAIR  2009   LUNG BIOPSY  1997   TEE WITHOUT CARDIOVERSION N/A 11/25/2018   Procedure: TRANSESOPHAGEAL ECHOCARDIOGRAM (TEE);  Surgeon: Elder Negus, MD;  Location: [facility] ENDOSCOPY;  Service: Cardiovascular;  Laterality: N/A;   TONSILLECTOMY  1979   Patient Active Problem List   Diagnosis Date Noted   Chronic low back pain 03/24/2021   Low testosterone 03/24/2021   Meniere's disease 03/23/2020   Acquired nasal septal defect 10/06/2019   CVA (cerebrovascular accident) (HCC) 11/14/2018   Numbness and tingling of foot 10/20/2018   Insomnia 10/20/2018   Erectile dysfunction 10/20/2018   Anal fissure 10/20/2018   Cold sore 10/20/2018   Osteoarthritis 04/20/2014   Allergic rhinitis 05/29/2012   OSA (obstructive sleep apnea) 06/04/2011   Pulmonary sarcoidosis (HCC) 01/11/2011   Gout 11/11/2009   Diverticulosis of large intestine 11/11/2009   History of colonic polyps 11/11/2009   Dyslipidemia with statin intolerance 10/24/2009   Deafness,  left 08/23/2009     REFERRING PROVIDER: Richardean Sale, DO  REFERRING DIAG: M54.50,G89.29 (ICD-10-CM) - Chronic right-sided low back pain without sciatica M51.35 (ICD-10-CM) - DDD (degenerative disc disease), thoracolumbar M54.6,G89.29 (ICD-10-CM) - Chronic right-sided thoracic back pain M25.511,G89.29,M25.512 (ICD-10-CM) - Chronic pain of both shoulders  Rationale for Evaluation and Treatment: Rehabilitation  THERAPY DIAG:  Other low back pain  Pain in thoracic spine  Bilateral shoulder pain, unspecified chronicity  Muscle weakness (generalized)  ONSET DATE: 09/23/2023  PT order  SUBJECTIVE:  SUBJECTIVE STATEMENT: Pt states his pain will tend to go away when he does exercises but will come back after sometime. No pain on arrival even with movement. Shoulders also feeling better. Notes improvement with golf and wants to continue working on it     PERTINENT HISTORY:  Chronic back pain R THR with posterolateral approach in 09/2022 OA CVA in 2020, left ear deafness, vestibular issues on L side  Pulmonary sarcoidosis    PAIN:  No pain, just tightness with rotation currently, 7-8/10 worst, 2-3/10 best   Location:  central and R sided lower thoracic to upper lumbar  0/10 current, 10/10 worst, 0/10 best Location:  bilat shoulders, R > L  Pt is R handed dominant  PRECAUTIONS: Other: R THR with posterolateral approach 12/23, CVA, vestibular issues on L side, pulmonary sarcoidosis   WEIGHT BEARING RESTRICTIONS: No  FALLS:  Has patient fallen in last 6 months? No  LIVING ENVIRONMENT: Lives with: lives with their spouse Lives in: 1 story home Stairs: 2 steps to enter without rail.  Pt has a ramp also.   OCCUPATION: Pt is retired.  PLOF: Independent.  Pt typically plays a couple of  rounds a golf per week.  Pt works out it in [employer] 3 days per week.   PATIENT GOALS: to not have pain with activities, to play golf without pain and stiffness   OBJECTIVE:  Note: Objective measures were completed at Evaluation unless otherwise noted.  DIAGNOSTIC FINDINGS:  X rays on 09/23/2023: FINDINGS: There is no evidence of lumbar spine fracture. Alignment is normal. Degenerative changes at each lumbar level with marginal osteophytes and disc space narrowing. Findings most severe at L5-S1.   IMPRESSION: Degenerative changes. No acute osseous abnormalities.  PA note indicated No acute fx or dislocation. decreased anterior height of T11 vertebral body with a hx of fall 2 months ago.  multiple areas of anterior vertebral spurring t/o thoracolumbar spine.    PALPATION: Pt has moderate + tightness in lower thoracic and upper lumbar paraspinals and moderate tightness in mid to lower lumbar paraspinals.  Pt had no tenderness with palpatoin  LUMBAR / Thoracic ROM:   AROM Lumbar Thoracic 2/27 01/03/24 lumbar   Flexion [facility] WFL   Extension WFL 50%   Right lateral flexion 90% WFL Just above knee joint  Left lateral flexion 60% WFL Just above knee joint  Right rotation WFL 75%   Left rotation WFL 75%    (Blank rows = not tested) Pt reports some tightness with lumbar ROM though no pain.   UPPER EXTREMITY ROM:     Active  Right eval Left eval Right 2/27 Left 2/27 R/L 01/03/24  Shoulder flexion 157 174   164 / 172  Shoulder scaption 163 175     Shoulder abduction 148 168     Shoulder ER   81 71   Shoulder IR   67 79                                                     (Blank rows = not tested)  MMT:    MMT Right eval Left eval Right 2/27 Left 2/27  Hip flexion 5/5 5/5    Hip extension      Hip abduction      Shoulder flexion 5/5 5/5    Shoulder abduction  5/5 5/5    Shoulder scaption 5/5 5/5    Knee flexion      Knee extension 5/5 5/5    Ankle dorsiflexion       Ankle plantarflexion      Shoulder ER   4+/5 with pain 5/5 with slight pain  Shoulder IR   5/5 5/5   (Blank rows = not tested)  LUMBAR SPECIAL TESTS:   Supine SLR test: negative bilat HS flexibility:  WFL    TREATMENT DATE:                                                                                                                               [facility] Adult PT Treatment:                                                DATE: 01/07/24 Therapeutic Exercise: Cat/camel x12 Kneeling CT rotation x8 BIL  Bird dog 2x8 BIL HEP update + education/handout  Neuromuscular re-ed: Partial get up 2x8 BIL cues for appropriate form/sequencing, breath control, core contraction, appropriate ROM Staggered mid row, blue band x10 slow; 2x5 with quick concentric slow eccentric cues for elbow mechanics and reducing compensations Paloff + rotational press towards contralateral shoulder greenband 2x8 BIL     [facility] Adult PT Treatment:                                                DATE: 01/03/24 Therapeutic Exercise: Cat/camel 2x10 cues for UE positioning Quadruped CT rotation x12 BIL  Standing thoracic ext w/ foam roll at wall x12 HEP discussion/education  Neuromuscular re-ed: Staggered low>high rotational press red band 2x8 BIL cues for comfortable ROM and motor control  Waist>shoulder red band chest press 2x10 BIL for improved shoulder stability Bird dog x8 BIL (noted inc instability LLE/RUE) Green band paloff + OH press x8 BIL    [facility] Adult PT Treatment:                                                DATE: 12/31/23 Therapeutic Exercise: S/L open book with short lever x 10 reps   Standing short lever open books x10 reps bilat  Q-ped Cat/camel 2x10 Green band paloff press 2x10 bilat with TrA Standing rows in staggered stance with GTB 2x10 with TrA Standing shoulder extension with TrA standing on airex with RTB and GTB x 12 each   Manual Therapy:  Pt received STM to bilat  lower thoracic and  lumbar paraspinals in prone     DATE: 12/27/23 Therapeutic Exercise:  Standing short lever open books x12 BIL cues for UE positioning  Cat/camel 2x8 cues for reduced compensations and initial setup Cervicothoracic rotation in quadruped x6 BIL  Suitcase carry 2x131ft BIL w/ 8# cues for posture HEP update + education  Neuromuscular re-ed: Green band paloff x12 BIL Red band paloff + OH press 2x8 BIL cues for comfortable ROM and posture  Staggered stance green band chest press x15 BIL  Staggered stance high>low green band row 2x12 BIL     PATIENT EDUCATION:  Education details: rationale for interventions, HEP  Person educated: Patient Education method: Explanation, Demonstration, Tactile cues, Verbal cues Education comprehension: verbalized understanding, returned demonstration, verbal cues required, tactile cues required, and needs further education     HOME EXERCISE PROGRAM: Access Code: [email or web address] URL: [email or web address] Date: 01/07/2024 Prepared by: Fransisco Hertz  Program Notes - with reverse chop, please disregard pelvic floor portion of comments, and perform as done in clinic  Exercises - Cat Cow  - 1 x daily - 7 x weekly - 2-3 sets - 6-8 reps - Quadruped Thoracic Rotation Full Range with Hand on Neck  - 1 x daily - 7 x weekly - 2-3 sets - 6-8 reps - Bird Dog  - 5 x weekly - 2 sets - 8 reps - Standing Anti-Rotation Press with Anchored Resistance  - 5 x weekly - 1-2 sets - 8 reps - Split Stance Shoulder Row with Resistance  - 3-4 x weekly - 2-3 sets - 8-10 reps - Chest Press with Anchored Resistance  - 3-4 x weekly - 1-2 sets - 8 reps - Reverse Chop with Pelvic Floor Contraction  - 3-4 x weekly - 1-2 sets - 8 reps    ASSESSMENT:  CLINICAL IMPRESSION: Pt arrives w/o pain, continues to endorse steady progress since start of care. Today we are able to progress for increased emphasis on rotational stability to work on pt goals for improving comfort with golf.  Also increasing velocity with anti rotational pulling movements which pt does well with. No adverse events, reports muscle fatigue as expected but no increase in resting pain. HEP update to reflect progression. Recommend continuing along current POC in order to address relevant deficits and improve functional tolerance. Pt departs today's session in no acute distress, all voiced questions/concerns addressed appropriately from PT perspective.      OBJECTIVE IMPAIRMENTS: decreased activity tolerance, decreased endurance, decreased mobility, decreased ROM, decreased strength, hypomobility, increased fascial restrictions, impaired UE functional use, and pain.   ACTIVITY LIMITATIONS: lifting, sleeping, bed mobility, and reaching laterally  PARTICIPATION LIMITATIONS:  golf  PERSONAL FACTORS: Time since onset of injury/illness/exacerbation and 3+ comorbidities: L THR, OA, CVA, pulmonary sarcoidosis and vestibular issues on L side   are also affecting patient's functional outcome.   REHAB POTENTIAL: Good  CLINICAL DECISION MAKING: Evolving/moderate complexity  EVALUATION COMPLEXITY: Moderate   GOALS:  SHORT TERM GOALS: Target date: 01/06/2024   Pt will be independent and compliant with HEP for improved pain, ROM, strength, and function.  Baseline: 01/03/24: reports daily HEP adherence Goal status: MET  2.  Pt will demo lumbar SB'ing AROM to be [facility] bilat for improved stiffness and mobility Baseline:  01/03/24: see ROM chart above Goal status: MET  3.  Pt will report at least a 25% improvement in pain and sx's overall.   Baseline:  01/03/24: 70% Goal status: MET    LONG TERM GOALS: Target date: 01/27/2024  Pt will be able to sleep at least 5/7 nights/wk without pain disturbance.  Baseline:  Goal status: INITIAL  2.  Pt will report at least a 70% improvement overall in pain and sx's.   Baseline:  Goal status: INITIAL  3.  Pt will demo improved core strength by progressing with core  exercises without adverse effects for improved core stability with functional mobility, daily activities, and golf.  Baseline:  Goal status: INITIAL  4.  Pt will be able to reach overhead and laterally without significant shoulder pain.  Baseline:  Goal status: INITIAL  5.  Pt will report at least a 70% reduction in pain with R rotation for improved pain with playing golf.  Baseline:  Goal status: INITIAL    PLAN:  PT FREQUENCY: 2x/week  PT DURATION: 6 weeks  PLANNED INTERVENTIONS: 97164- PT Re-evaluation, 97110-Therapeutic exercises, 97530- Therapeutic activity, 97112- Neuromuscular re-education, 97535- Self Care, [id number]- Manual therapy, [phone]- Gait training, [phone]- Aquatic Therapy, [phone]- Ultrasound, Patient/Family education, Balance training, Stair training, Taping, Dry Needling, Joint mobilization, Spinal mobilization, Cryotherapy, and Moist heat.  PLAN FOR NEXT SESSION:  recommend continuing with periscapular and core strengthening, challenging rotation and antirotation stability within pt tolerance to work towards his goal of improved comfort w/ golf.    Ashley Murrain PT, DPT 01/07/2024 11:45 AM

## 2024-01-10 ENCOUNTER — Encounter (HOSPITAL_BASED_OUTPATIENT_CLINIC_OR_DEPARTMENT_OTHER): Payer: PPO | Admitting: Physical Therapy

## 2024-01-13 ENCOUNTER — Ambulatory Visit (HOSPITAL_BASED_OUTPATIENT_CLINIC_OR_DEPARTMENT_OTHER): Admitting: Physical Therapy

## 2024-01-13 DIAGNOSIS — M5459 Other low back pain: Secondary | ICD-10-CM

## 2024-01-13 DIAGNOSIS — M6281 Muscle weakness (generalized): Secondary | ICD-10-CM

## 2024-01-13 DIAGNOSIS — M25511 Pain in right shoulder: Secondary | ICD-10-CM

## 2024-01-13 DIAGNOSIS — M546 Pain in thoracic spine: Secondary | ICD-10-CM

## 2024-01-13 NOTE — Therapy (Signed)
 OUTPATIENT PHYSICAL THERAPY TREATMENT   Patient Name: Jason Weaver MRN: [id number] DOB:10/10/1956, 68 y.o., male Today's Date: 01/14/2024  END OF SESSION:  PT End of Session - 01/13/24 1117     Visit Number 8    Number of Visits 12    Date for PT Re-Evaluation 01/27/24    Authorization Type HTA    PT Start Time 1115    PT Stop Time 1156    PT Time Calculation (min) 41 min    Activity Tolerance Patient tolerated treatment well    Behavior During Therapy [facility] for tasks assessed/performed              Past Medical History:  Diagnosis Date   Cardiomyopathy, nonischemic (HCC)    Diverticulosis of colon    ED (erectile dysfunction)    Gout    Hearing loss    Hx of colonic polyps    Hypercholesteremia 12/05/2018   Hyperlipidemia    Nasal septal deviation    Obesity    OSA (obstructive sleep apnea)    PAF (paroxysmal atrial fibrillation) (HCC)    Pulmonary sarcoidosis (HCC)  1997    biopsy proven   Past Surgical History:  Procedure Laterality Date   CATARACT EXTRACTION     HERNIA REPAIR  2009   LUNG BIOPSY  1997   TEE WITHOUT CARDIOVERSION N/A 11/25/2018   Procedure: TRANSESOPHAGEAL ECHOCARDIOGRAM (TEE);  Surgeon: Elder Negus, MD;  Location: [facility] ENDOSCOPY;  Service: Cardiovascular;  Laterality: N/A;   TONSILLECTOMY  1979   Patient Active Problem List   Diagnosis Date Noted   Chronic low back pain 03/24/2021   Low testosterone 03/24/2021   Meniere's disease 03/23/2020   Acquired nasal septal defect 10/06/2019   CVA (cerebrovascular accident) (HCC) 11/14/2018   Numbness and tingling of foot 10/20/2018   Insomnia 10/20/2018   Erectile dysfunction 10/20/2018   Anal fissure 10/20/2018   Cold sore 10/20/2018   Osteoarthritis 04/20/2014   Allergic rhinitis 05/29/2012   OSA (obstructive sleep apnea) 06/04/2011   Pulmonary sarcoidosis (HCC) 01/11/2011   Gout 11/11/2009   Diverticulosis of large intestine 11/11/2009   History of colonic polyps 11/11/2009    Dyslipidemia with statin intolerance 10/24/2009   Deafness, left 08/23/2009     REFERRING PROVIDER: Richardean Sale, DO  REFERRING DIAG: M54.50,G89.29 (ICD-10-CM) - Chronic right-sided low back pain without sciatica M51.35 (ICD-10-CM) - DDD (degenerative disc disease), thoracolumbar M54.6,G89.29 (ICD-10-CM) - Chronic right-sided thoracic back pain M25.511,G89.29,M25.512 (ICD-10-CM) - Chronic pain of both shoulders  Rationale for Evaluation and Treatment: Rehabilitation  THERAPY DIAG:  Other low back pain  Pain in thoracic spine  Bilateral shoulder pain, unspecified chronicity  Muscle weakness (generalized)  ONSET DATE: 09/23/2023  PT order  SUBJECTIVE:  SUBJECTIVE STATEMENT: Pt bought a new driver and has been hitting a lot of golf balls.  He hit golf balls for 2 hours yesterday.  Pt had to do some work under a counter and also yard work.  Pt states he thinks therapy is helping.  Pt reports he would have hurt much more after those type of activities prior to therapy.  Shoulder pain has vastly improved.  Pt reports compliance with HEP.     PERTINENT HISTORY:  Chronic back pain R THR with posterolateral approach in 09/2022 OA CVA in 2020, left ear deafness, vestibular issues on L side  Pulmonary sarcoidosis    PAIN:  No pain at rest.  Tightness > pain with rotation currently.   Location:  central and R sided lower thoracic to upper lumbar  0/10 current, 10/10 worst, 0/10 best Location:  bilat shoulders, R > L  Pt is R handed dominant  PRECAUTIONS: Other: R THR with posterolateral approach 12/23, CVA, vestibular issues on L side, pulmonary sarcoidosis   WEIGHT BEARING RESTRICTIONS: No  FALLS:  Has patient fallen in last 6 months? No  LIVING ENVIRONMENT: Lives with: lives with their  spouse Lives in: 1 story home Stairs: 2 steps to enter without rail.  Pt has a ramp also.   OCCUPATION: Pt is retired.  PLOF: Independent.  Pt typically plays a couple of rounds a golf per week.  Pt works out it in [employer] 3 days per week.   PATIENT GOALS: to not have pain with activities, to play golf without pain and stiffness   OBJECTIVE:  Note: Objective measures were completed at Evaluation unless otherwise noted.  DIAGNOSTIC FINDINGS:  X rays on 09/23/2023: FINDINGS: There is no evidence of lumbar spine fracture. Alignment is normal. Degenerative changes at each lumbar level with marginal osteophytes and disc space narrowing. Findings most severe at L5-S1.   IMPRESSION: Degenerative changes. No acute osseous abnormalities.  PA note indicated No acute fx or dislocation. decreased anterior height of T11 vertebral body with a hx of fall 2 months ago.  multiple areas of anterior vertebral spurring t/o thoracolumbar spine.    PALPATION: Pt has moderate + tightness in lower thoracic and upper lumbar paraspinals and moderate tightness in mid to lower lumbar paraspinals.  Pt had no tenderness with palpatoin  LUMBAR / Thoracic ROM:   AROM Lumbar Thoracic 2/27 01/03/24 lumbar   Flexion [facility] WFL   Extension WFL 50%   Right lateral flexion 90% WFL Just above knee joint  Left lateral flexion 60% WFL Just above knee joint  Right rotation WFL 75%   Left rotation WFL 75%    (Blank rows = not tested) Pt reports some tightness with lumbar ROM though no pain.   UPPER EXTREMITY ROM:     Active  Right eval Left eval Right 2/27 Left 2/27 R/L 01/03/24  Shoulder flexion 157 174   164 / 172  Shoulder scaption 163 175     Shoulder abduction 148 168     Shoulder ER   81 71   Shoulder IR   67 79                                                     (Blank rows = not tested)  MMT:    MMT Right eval Left eval Right 2/27  Left 2/27  Hip flexion 5/5 5/5    Hip extension       Hip abduction      Shoulder flexion 5/5 5/5    Shoulder abduction 5/5 5/5    Shoulder scaption 5/5 5/5    Knee flexion      Knee extension 5/5 5/5    Ankle dorsiflexion      Ankle plantarflexion      Shoulder ER   4+/5 with pain 5/5 with slight pain  Shoulder IR   5/5 5/5   (Blank rows = not tested)  LUMBAR SPECIAL TESTS:   Supine SLR test: negative bilat HS flexibility:  WFL    TREATMENT DATE:                                                                                                                               01/13/24 Therapeutic Exercise: Cat/camel 2x10 Quadruped CT rotation 2x10  Bird dog 2x10 BIL   Neuromuscular re-ed: Green band paloff press 2x10 bilat with TrA Reverse chop with GTB x10 bilat Rows with BTB single arm in staggered stance 2x10 bilat  Manual Therapy:  Pt received STM to bilat  lower thoracic and lumbar paraspinals in prone      [facility] Adult PT Treatment:                                                DATE: 01/07/24 Therapeutic Exercise: Cat/camel x12 Kneeling CT rotation x8 BIL  Bird dog 2x8 BIL HEP update + education/handout  Neuromuscular re-ed: Partial get up 2x8 BIL cues for appropriate form/sequencing, breath control, core contraction, appropriate ROM Staggered mid row, blue band x10 slow; 2x5 with quick concentric slow eccentric cues for elbow mechanics and reducing compensations Paloff + rotational press towards contralateral shoulder greenband 2x8 BIL     [facility] Adult PT Treatment:                                                DATE: 01/03/24 Therapeutic Exercise: Cat/camel 2x10 cues for UE positioning Quadruped CT rotation x12 BIL  Standing thoracic ext w/ foam roll at wall x12 HEP discussion/education  Neuromuscular re-ed: Staggered low>high rotational press red band 2x8 BIL cues for comfortable ROM and motor control  Waist>shoulder red band chest press 2x10 BIL for improved shoulder stability Bird dog x8 BIL (noted inc  instability LLE/RUE) Green band paloff + OH press x8 BIL    [facility] Adult PT Treatment:  DATE: 12/31/23 Therapeutic Exercise: /S/L open book with short lever x 10 reps   Standing short lever open books x10 reps bilat  Q-ped Cat/camel 2x10 Green band paloff press 2x10 bilat with TrA Standing rows in staggered stance with GTB 2x10 with TrA Standing shoulder extension with TrA standing on airex with RTB and GTB x 12 each   Manual Therapy:  Pt received STM to bilat  lower thoracic and lumbar paraspinals in prone     PATIENT EDUCATION:  Education details: rationale for interventions, HEP, and exercise form. Person educated: Patient Education method: Explanation, Demonstration, Tactile cues, Verbal cues Education comprehension: verbalized understanding, returned demonstration, verbal cues required, tactile cues required, and needs further education     HOME EXERCISE PROGRAM: Access Code: [email or web address] URL: [email or web address] Date: 01/07/2024 Prepared by: Fransisco Hertz  Program Notes - with reverse chop, please disregard pelvic floor portion of comments, and perform as done in clinic  Exercises - Cat Cow  - 1 x daily - 7 x weekly - 2-3 sets - 6-8 reps - Quadruped Thoracic Rotation Full Range with Hand on Neck  - 1 x daily - 7 x weekly - 2-3 sets - 6-8 reps - Bird Dog  - 5 x weekly - 2 sets - 8 reps - Standing Anti-Rotation Press with Anchored Resistance  - 5 x weekly - 1-2 sets - 8 reps - Split Stance Shoulder Row with Resistance  - 3-4 x weekly - 2-3 sets - 8-10 reps - Chest Press with Anchored Resistance  - 3-4 x weekly - 1-2 sets - 8 reps - Reverse Chop with Pelvic Floor Contraction  - 3-4 x weekly - 1-2 sets - 8 reps    ASSESSMENT:  CLINICAL IMPRESSION: Pt is progressing well with pain, sx's, and function.  Pt has been hitting more golf balls.  He continues to have tightness though has improved tolerance with golf.  Pt  performed exercises well with cuing and instruction in correct form and positioning.  Pt is improving with mobility and core/postural strength.  Pt has some difficulty with birddogs and requires cuing for form.  PT performed STM to bilat lumbar and thoracic paraspinals to improve soft tissue tightness and mobility, reduce myofascial restrictions, and improve pain.  He responded well to Rx having no c/o's after Rx.     OBJECTIVE IMPAIRMENTS: decreased activity tolerance, decreased endurance, decreased mobility, decreased ROM, decreased strength, hypomobility, increased fascial restrictions, impaired UE functional use, and pain.   ACTIVITY LIMITATIONS: lifting, sleeping, bed mobility, and reaching laterally  PARTICIPATION LIMITATIONS:  golf  PERSONAL FACTORS: Time since onset of injury/illness/exacerbation and 3+ comorbidities: L THR, OA, CVA, pulmonary sarcoidosis and vestibular issues on L side   are also affecting patient's functional outcome.   REHAB POTENTIAL: Good  CLINICAL DECISION MAKING: Evolving/moderate complexity  EVALUATION COMPLEXITY: Moderate   GOALS:  SHORT TERM GOALS: Target date: 01/06/2024   Pt will be independent and compliant with HEP for improved pain, ROM, strength, and function.  Baseline: 01/03/24: reports daily HEP adherence Goal status: MET  2.  Pt will demo lumbar SB'ing AROM to be [facility] bilat for improved stiffness and mobility Baseline:  01/03/24: see ROM chart above Goal status: MET  3.  Pt will report at least a 25% improvement in pain and sx's overall.   Baseline:  01/03/24: 70% Goal status: MET    LONG TERM GOALS: Target date: 01/27/2024  Pt will be able to sleep at least 5/7 nights/wk without pain disturbance.  Baseline:  Goal status: INITIAL  2.  Pt will report at least a 70% improvement overall in pain and sx's.   Baseline:  Goal status: INITIAL  3.  Pt will demo improved core strength by progressing with core exercises without adverse  effects for improved core stability with functional mobility, daily activities, and golf.  Baseline:  Goal status: INITIAL  4.  Pt will be able to reach overhead and laterally without significant shoulder pain.  Baseline:  Goal status: INITIAL  5.  Pt will report at least a 70% reduction in pain with R rotation for improved pain with playing golf.  Baseline:  Goal status: INITIAL    PLAN:  PT FREQUENCY: 2x/week  PT DURATION: 6 weeks  PLANNED INTERVENTIONS: 97164- PT Re-evaluation, 97110-Therapeutic exercises, 97530- Therapeutic activity, 97112- Neuromuscular re-education, 97535- Self Care, 16109- Manual therapy, [phone]- Gait training, [phone]- Aquatic Therapy, [phone]- Ultrasound, Patient/Family education, Balance training, Stair training, Taping, Dry Needling, Joint mobilization, Spinal mobilization, Cryotherapy, and Moist heat.  PLAN FOR NEXT SESSION:  recommend continuing with periscapular and core strengthening, challenging rotation and antirotation stability within pt tolerance to work towards his goal of improved comfort w/ golf.    Audie Clear III PT, DPT 01/14/24 11:36 AM

## 2024-01-14 ENCOUNTER — Encounter (HOSPITAL_BASED_OUTPATIENT_CLINIC_OR_DEPARTMENT_OTHER): Payer: Self-pay | Admitting: Physical Therapy

## 2024-01-14 ENCOUNTER — Ambulatory Visit (HOSPITAL_BASED_OUTPATIENT_CLINIC_OR_DEPARTMENT_OTHER): Payer: PPO | Admitting: Physical Therapy

## 2024-01-17 ENCOUNTER — Ambulatory Visit (HOSPITAL_BASED_OUTPATIENT_CLINIC_OR_DEPARTMENT_OTHER): Payer: PPO | Admitting: Physical Therapy

## 2024-01-17 ENCOUNTER — Encounter (HOSPITAL_BASED_OUTPATIENT_CLINIC_OR_DEPARTMENT_OTHER): Payer: Self-pay | Admitting: Physical Therapy

## 2024-01-17 DIAGNOSIS — M6281 Muscle weakness (generalized): Secondary | ICD-10-CM

## 2024-01-17 DIAGNOSIS — M5459 Other low back pain: Secondary | ICD-10-CM

## 2024-01-17 DIAGNOSIS — M25511 Pain in right shoulder: Secondary | ICD-10-CM

## 2024-01-17 DIAGNOSIS — M546 Pain in thoracic spine: Secondary | ICD-10-CM

## 2024-01-17 NOTE — Therapy (Signed)
 OUTPATIENT PHYSICAL THERAPY TREATMENT   Patient Name: Jason Weaver MRN: [id number] DOB:1956/09/07, 68 y.o., male Today's Date: 01/17/2024  END OF SESSION:  PT End of Session - 01/17/24 1119     Visit Number 9    Number of Visits 12    Date for PT Re-Evaluation 01/27/24    Authorization Type HTA    PT Start Time 1115    PT Stop Time 1156    PT Time Calculation (min) 41 min    Activity Tolerance Patient tolerated treatment well    Behavior During Therapy [facility] for tasks assessed/performed              Past Medical History:  Diagnosis Date   Cardiomyopathy, nonischemic (HCC)    Diverticulosis of colon    ED (erectile dysfunction)    Gout    Hearing loss    Hx of colonic polyps    Hypercholesteremia 12/05/2018   Hyperlipidemia    Nasal septal deviation    Obesity    OSA (obstructive sleep apnea)    PAF (paroxysmal atrial fibrillation) (HCC)    Pulmonary sarcoidosis (HCC)  1997    biopsy proven   Past Surgical History:  Procedure Laterality Date   CATARACT EXTRACTION     HERNIA REPAIR  2009   LUNG BIOPSY  1997   TEE WITHOUT CARDIOVERSION N/A 11/25/2018   Procedure: TRANSESOPHAGEAL ECHOCARDIOGRAM (TEE);  Surgeon: Elder Negus, MD;  Location: [facility] ENDOSCOPY;  Service: Cardiovascular;  Laterality: N/A;   TONSILLECTOMY  1979   Patient Active Problem List   Diagnosis Date Noted   Chronic low back pain 03/24/2021   Low testosterone 03/24/2021   Meniere's disease 03/23/2020   Acquired nasal septal defect 10/06/2019   CVA (cerebrovascular accident) (HCC) 11/14/2018   Numbness and tingling of foot 10/20/2018   Insomnia 10/20/2018   Erectile dysfunction 10/20/2018   Anal fissure 10/20/2018   Cold sore 10/20/2018   Osteoarthritis 04/20/2014   Allergic rhinitis 05/29/2012   OSA (obstructive sleep apnea) 06/04/2011   Pulmonary sarcoidosis (HCC) 01/11/2011   Gout 11/11/2009   Diverticulosis of large intestine 11/11/2009   History of colonic polyps 11/11/2009    Dyslipidemia with statin intolerance 10/24/2009   Deafness, left 08/23/2009     REFERRING PROVIDER: Richardean Sale, DO  REFERRING DIAG: M54.50,G89.29 (ICD-10-CM) - Chronic right-sided low back pain without sciatica M51.35 (ICD-10-CM) - DDD (degenerative disc disease), thoracolumbar M54.6,G89.29 (ICD-10-CM) - Chronic right-sided thoracic back pain M25.511,G89.29,M25.512 (ICD-10-CM) - Chronic pain of both shoulders  Rationale for Evaluation and Treatment: Rehabilitation  THERAPY DIAG:  Other low back pain  Pain in thoracic spine  Bilateral shoulder pain, unspecified chronicity  Muscle weakness (generalized)  ONSET DATE: 09/23/2023  PT order  SUBJECTIVE:  SUBJECTIVE STATEMENT: Pt denies any adverse effects after prior Rx.  Pt reports compliance with HEP and he feels good with the exercises.  Pt states he did some yard work this AM and and feels tight now.  He has increased lumbar pain after yard work.  Pt states he felt good this AM before yard work.  He played golf Tuesday and did well.    PERTINENT HISTORY:  Chronic back pain R THR with posterolateral approach in 09/2022 OA CVA in 2020, left ear deafness, vestibular issues on L side  Pulmonary sarcoidosis    PAIN:  NPRS:  7/10 Location:  Lumbar  NPRS:  5/10  Location:  R sided thoracic  NPRS:  5/10 / 2/10 best Location: R / L  Pt is R handed dominant  PRECAUTIONS: Other: R THR with posterolateral approach 12/23, CVA, vestibular issues on L side, pulmonary sarcoidosis   WEIGHT BEARING RESTRICTIONS: No  FALLS:  Has patient fallen in last 6 months? No  LIVING ENVIRONMENT: Lives with: lives with their spouse Lives in: 1 story home Stairs: 2 steps to enter without rail.  Pt has a ramp also.   OCCUPATION: Pt is  retired.  PLOF: Independent.  Pt typically plays a couple of rounds a golf per week.  Pt works out it in [employer] 3 days per week.   PATIENT GOALS: to not have pain with activities, to play golf without pain and stiffness   OBJECTIVE:  Note: Objective measures were completed at Evaluation unless otherwise noted.  DIAGNOSTIC FINDINGS:  X rays on 09/23/2023: FINDINGS: There is no evidence of lumbar spine fracture. Alignment is normal. Degenerative changes at each lumbar level with marginal osteophytes and disc space narrowing. Findings most severe at L5-S1.   IMPRESSION: Degenerative changes. No acute osseous abnormalities.  PA note indicated No acute fx or dislocation. decreased anterior height of T11 vertebral body with a hx of fall 2 months ago.  multiple areas of anterior vertebral spurring t/o thoracolumbar spine.    PALPATION: Pt has moderate + tightness in lower thoracic and upper lumbar paraspinals and moderate tightness in mid to lower lumbar paraspinals.  Pt had no tenderness with palpatoin  LUMBAR / Thoracic ROM:   AROM Lumbar Thoracic 2/27 01/03/24 lumbar   Flexion [facility] WFL   Extension WFL 50%   Right lateral flexion 90% WFL Just above knee joint  Left lateral flexion 60% WFL Just above knee joint  Right rotation WFL 75%   Left rotation WFL 75%    (Blank rows = not tested) Pt reports some tightness with lumbar ROM though no pain.   UPPER EXTREMITY ROM:     Active  Right eval Left eval Right 2/27 Left 2/27 R/L 01/03/24  Shoulder flexion 157 174   164 / 172  Shoulder scaption 163 175     Shoulder abduction 148 168     Shoulder ER   81 71   Shoulder IR   67 79                                                     (Blank rows = not tested)  MMT:    MMT Right eval Left eval Right 2/27 Left 2/27  Hip flexion 5/5 5/5    Hip extension      Hip abduction  Shoulder flexion 5/5 5/5    Shoulder abduction 5/5 5/5    Shoulder scaption 5/5 5/5    Knee  flexion      Knee extension 5/5 5/5    Ankle dorsiflexion      Ankle plantarflexion      Shoulder ER   4+/5 with pain 5/5 with slight pain  Shoulder IR   5/5 5/5   (Blank rows = not tested)  LUMBAR SPECIAL TESTS:   Supine SLR test: negative bilat HS flexibility:  WFL    TREATMENT DATE:                                                                                                                               01/17/24 Manual Therapy:  Pt received STM to bilat  lower thoracic and lumbar paraspinals in prone   Therapeutic Exercise: Quadruped CT rotation 2x10  Bird dog 2x10 BIL Standing horizontal abduction with RTB 2x10   Neuromuscular re-ed: Green band paloff press 2x10 bilat with TrA Supine marching with TrA x 10 (w/n hip precautions) Supine alt UE/LE with TrA 2x10 (w/n hip precautions) Rows with BTB single arm in staggered stance 2x10 bilat    01/13/24 Therapeutic Exercise: Cat/camel 2x10 Quadruped CT rotation 2x10  Bird dog 2x10 BIL   Neuromuscular re-ed: Green band paloff press 2x10 bilat with TrA Reverse chop with GTB x10 bilat Rows with BTB single arm in staggered stance 2x10 bilat  Manual Therapy:  Pt received STM to bilat  lower thoracic and lumbar paraspinals in prone      [facility] Adult PT Treatment:                                                DATE: 01/07/24 Therapeutic Exercise: Cat/camel x12 Kneeling CT rotation x8 BIL  Bird dog 2x8 BIL HEP update + education/handout  Neuromuscular re-ed: Partial get up 2x8 BIL cues for appropriate form/sequencing, breath control, core contraction, appropriate ROM Staggered mid row, blue band x10 slow; 2x5 with quick concentric slow eccentric cues for elbow mechanics and reducing compensations Paloff + rotational press towards contralateral shoulder greenband 2x8 BIL     [facility] Adult PT Treatment:                                                DATE: 01/03/24 Therapeutic Exercise: Cat/camel 2x10 cues for UE  positioning Quadruped CT rotation x12 BIL  Standing thoracic ext w/ foam roll at wall x12 HEP discussion/education  Neuromuscular re-ed: Staggered low>high rotational press red band 2x8 BIL cues for comfortable ROM and motor control  Waist>shoulder red band chest press 2x10 BIL for improved shoulder stability Bird dog x8 BIL (  noted inc instability LLE/RUE) Green band paloff + OH press x8 BIL     PATIENT EDUCATION:  Education details: rationale for interventions, HEP, and exercise form. Person educated: Patient Education method: Explanation, Demonstration, Tactile cues, Verbal cues Education comprehension: verbalized understanding, returned demonstration, verbal cues required, tactile cues required, and needs further education     HOME EXERCISE PROGRAM: Access Code: [email or web address] URL: [email or web address] Date: 01/07/2024 Prepared by: Fransisco Hertz  Program Notes - with reverse chop, please disregard pelvic floor portion of comments, and perform as done in clinic  Exercises - Cat Cow  - 1 x daily - 7 x weekly - 2-3 sets - 6-8 reps - Quadruped Thoracic Rotation Full Range with Hand on Neck  - 1 x daily - 7 x weekly - 2-3 sets - 6-8 reps - Bird Dog  - 5 x weekly - 2 sets - 8 reps - Standing Anti-Rotation Press with Anchored Resistance  - 5 x weekly - 1-2 sets - 8 reps - Split Stance Shoulder Row with Resistance  - 3-4 x weekly - 2-3 sets - 8-10 reps - Chest Press with Anchored Resistance  - 3-4 x weekly - 1-2 sets - 8 reps - Reverse Chop with Pelvic Floor Contraction  - 3-4 x weekly - 1-2 sets - 8 reps    ASSESSMENT:  CLINICAL IMPRESSION: Pt continues to make good progress with pain, sx's, and function.  Pt has improved tolerance with playing golf.  He presents to Rx today with pain and tightness after performing yard work.  PT performed STM to bilat lumbar and thoracic paraspinals to improve soft tissue tightness and mobility, reduce myofascial restrictions, and  improve pain.  PT had more focus on lumbar and pt states he feels better after STM.  Pt performed there ex and neuro re-ed activities well without c/o's.  He responded well to Rx reporting less pain and stating he felt better after Rx than before Rx.  Pt should benefit from cont skilled PT to address ongoing goals and to improve overall function.      OBJECTIVE IMPAIRMENTS: decreased activity tolerance, decreased endurance, decreased mobility, decreased ROM, decreased strength, hypomobility, increased fascial restrictions, impaired UE functional use, and pain.   ACTIVITY LIMITATIONS: lifting, sleeping, bed mobility, and reaching laterally  PARTICIPATION LIMITATIONS:  golf  PERSONAL FACTORS: Time since onset of injury/illness/exacerbation and 3+ comorbidities: L THR, OA, CVA, pulmonary sarcoidosis and vestibular issues on L side   are also affecting patient's functional outcome.   REHAB POTENTIAL: Good  CLINICAL DECISION MAKING: Evolving/moderate complexity  EVALUATION COMPLEXITY: Moderate   GOALS:  SHORT TERM GOALS: Target date: 01/06/2024   Pt will be independent and compliant with HEP for improved pain, ROM, strength, and function.  Baseline: 01/03/24: reports daily HEP adherence Goal status: MET  2.  Pt will demo lumbar SB'ing AROM to be [facility] bilat for improved stiffness and mobility Baseline:  01/03/24: see ROM chart above Goal status: MET  3.  Pt will report at least a 25% improvement in pain and sx's overall.   Baseline:  01/03/24: 70% Goal status: MET    LONG TERM GOALS: Target date: 01/27/2024  Pt will be able to sleep at least 5/7 nights/wk without pain disturbance.  Baseline:  Goal status: INITIAL  2.  Pt will report at least a 70% improvement overall in pain and sx's.   Baseline:  Goal status: INITIAL  3.  Pt will demo improved core strength by progressing with core exercises without adverse effects for  improved core stability with functional mobility, daily  activities, and golf.  Baseline:  Goal status: INITIAL  4.  Pt will be able to reach overhead and laterally without significant shoulder pain.  Baseline:  Goal status: INITIAL  5.  Pt will report at least a 70% reduction in pain with R rotation for improved pain with playing golf.  Baseline:  Goal status: INITIAL    PLAN:  PT FREQUENCY: 2x/week  PT DURATION: 6 weeks  PLANNED INTERVENTIONS: 97164- PT Re-evaluation, 97110-Therapeutic exercises, 97530- Therapeutic activity, 97112- Neuromuscular re-education, 97535- Self Care, 56213- Manual therapy, [phone]- Gait training, [phone]- Aquatic Therapy, [phone]- Ultrasound, Patient/Family education, Balance training, Stair training, Taping, Dry Needling, Joint mobilization, Spinal mobilization, Cryotherapy, and Moist heat.  PLAN FOR NEXT SESSION:  recommend continuing with periscapular and core strengthening, challenging rotation and antirotation stability within pt tolerance to work towards his goal of improved comfort w/ golf.    Audie Clear III PT, DPT 01/17/24 12:09 PM

## 2024-01-20 NOTE — Therapy (Signed)
 OUTPATIENT PHYSICAL THERAPY TREATMENT   Patient Name: Jason Weaver MRN: [id number] DOB:1956/06/14, 68 y.o., male Today's Date: 01/21/2024  END OF SESSION:  PT End of Session - 01/21/24 1022     Visit Number 10    Number of Visits 12    Date for PT Re-Evaluation 01/27/24    Authorization Type HTA    PT Start Time 1022   late check in   PT Stop Time 1103    PT Time Calculation (min) 41 min    Activity Tolerance Patient tolerated treatment well               Past Medical History:  Diagnosis Date   Cardiomyopathy, nonischemic (HCC)    Diverticulosis of colon    ED (erectile dysfunction)    Gout    Hearing loss    Hx of colonic polyps    Hypercholesteremia 12/05/2018   Hyperlipidemia    Nasal septal deviation    Obesity    OSA (obstructive sleep apnea)    PAF (paroxysmal atrial fibrillation) (HCC)    Pulmonary sarcoidosis (HCC)  1997    biopsy proven   Past Surgical History:  Procedure Laterality Date   CATARACT EXTRACTION     HERNIA REPAIR  2009   LUNG BIOPSY  1997   TEE WITHOUT CARDIOVERSION N/A 11/25/2018   Procedure: TRANSESOPHAGEAL ECHOCARDIOGRAM (TEE);  Surgeon: Elder Negus, MD;  Location: [facility] ENDOSCOPY;  Service: Cardiovascular;  Laterality: N/A;   TONSILLECTOMY  1979   Patient Active Problem List   Diagnosis Date Noted   Chronic low back pain 03/24/2021   Low testosterone 03/24/2021   Meniere's disease 03/23/2020   Acquired nasal septal defect 10/06/2019   CVA (cerebrovascular accident) (HCC) 11/14/2018   Numbness and tingling of foot 10/20/2018   Insomnia 10/20/2018   Erectile dysfunction 10/20/2018   Anal fissure 10/20/2018   Cold sore 10/20/2018   Osteoarthritis 04/20/2014   Allergic rhinitis 05/29/2012   OSA (obstructive sleep apnea) 06/04/2011   Pulmonary sarcoidosis (HCC) 01/11/2011   Gout 11/11/2009   Diverticulosis of large intestine 11/11/2009   History of colonic polyps 11/11/2009   Dyslipidemia with statin intolerance  10/24/2009   Deafness, left 08/23/2009     REFERRING PROVIDER: Richardean Sale, DO  REFERRING DIAG: M54.50,G89.29 (ICD-10-CM) - Chronic right-sided low back pain without sciatica M51.35 (ICD-10-CM) - DDD (degenerative disc disease), thoracolumbar M54.6,G89.29 (ICD-10-CM) - Chronic right-sided thoracic back pain M25.511,G89.29,M25.512 (ICD-10-CM) - Chronic pain of both shoulders  Rationale for Evaluation and Treatment: Rehabilitation  THERAPY DIAG:  Other low back pain  Pain in thoracic spine  Bilateral shoulder pain, unspecified chronicity  Muscle weakness (generalized)  ONSET DATE: 09/23/2023  PT order  SUBJECTIVE:  SUBJECTIVE STATEMENT: Pt states he has been doing a lot of yard work, pruning, etc for several hours. Had rougher symptoms yesterday. Feeling a bit better today, took some ibuprofen. Has been feeling good after PT sessions. Some knee pain today, back feeling about the same as normal.      PERTINENT HISTORY:  Chronic back pain R THR with posterolateral approach in 09/2022 OA CVA in 2020, left ear deafness, vestibular issues on L side  Pulmonary sarcoidosis    PAIN:  NPRS:  7/10 Location:  Lumbar  NPRS:  5/10  Location:  R sided thoracic  NPRS:  5/10 / 2/10 best Location: R / L  Pt is R handed dominant  PRECAUTIONS: Other: R THR with posterolateral approach 12/23, CVA, vestibular issues on L side, pulmonary sarcoidosis   WEIGHT BEARING RESTRICTIONS: No  FALLS:  Has patient fallen in last 6 months? No  LIVING ENVIRONMENT: Lives with: lives with their spouse Lives in: 1 story home Stairs: 2 steps to enter without rail.  Pt has a ramp also.   OCCUPATION: Pt is retired.  PLOF: Independent.  Pt typically plays a couple of rounds a golf per week.  Pt works out it  in [employer] 3 days per week.   PATIENT GOALS: to not have pain with activities, to play golf without pain and stiffness   OBJECTIVE:  Note: Objective measures were completed at Evaluation unless otherwise noted.  DIAGNOSTIC FINDINGS:  X rays on 09/23/2023: FINDINGS: There is no evidence of lumbar spine fracture. Alignment is normal. Degenerative changes at each lumbar level with marginal osteophytes and disc space narrowing. Findings most severe at L5-S1.   IMPRESSION: Degenerative changes. No acute osseous abnormalities.  PA note indicated No acute fx or dislocation. decreased anterior height of T11 vertebral body with a hx of fall 2 months ago.  multiple areas of anterior vertebral spurring t/o thoracolumbar spine.    PALPATION: Pt has moderate + tightness in lower thoracic and upper lumbar paraspinals and moderate tightness in mid to lower lumbar paraspinals.  Pt had no tenderness with palpatoin  LUMBAR / Thoracic ROM:   AROM Lumbar Thoracic 2/27 01/03/24 lumbar   Flexion [facility] WFL   Extension WFL 50%   Right lateral flexion 90% WFL Just above knee joint  Left lateral flexion 60% WFL Just above knee joint  Right rotation WFL 75%   Left rotation WFL 75%    (Blank rows = not tested) Pt reports some tightness with lumbar ROM though no pain.   UPPER EXTREMITY ROM:     Active  Right eval Left eval Right 2/27 Left 2/27 R/L 01/03/24  Shoulder flexion 157 174   164 / 172  Shoulder scaption 163 175     Shoulder abduction 148 168     Shoulder ER   81 71   Shoulder IR   67 79                                                     (Blank rows = not tested)  MMT:    MMT Right eval Left eval Right 2/27 Left 2/27  Hip flexion 5/5 5/5    Hip extension      Hip abduction      Shoulder flexion 5/5 5/5    Shoulder abduction 5/5 5/5  Shoulder scaption 5/5 5/5    Knee flexion      Knee extension 5/5 5/5    Ankle dorsiflexion      Ankle plantarflexion      Shoulder ER    4+/5 with pain 5/5 with slight pain  Shoulder IR   5/5 5/5   (Blank rows = not tested)  LUMBAR SPECIAL TESTS:   Supine SLR test: negative bilat HS flexibility:  WFL    TREATMENT DATE:                                                                                                                               [facility] Adult PT Treatment:                                                DATE: 01/21/24  Neuromuscular re-ed: [place] Si band paloff x12 BIL Green band paloff + OH press x10 BIL  Reverse chop green band x10 BIL Green band row w/ shoulder abd 90 deg 2x12 cues for appropriate form HEP update + education  Therapeutic Activity: Green band bent lawnmower row x8, x10 BIL  BW hinge 2x10 cues for trunk/hip mechanics  10# suitcase carry 4x50ft BIL cues for posture/pacing    01/17/24 Manual Therapy:  Pt received STM to bilat  lower thoracic and lumbar paraspinals in prone   Therapeutic Exercise: Quadruped CT rotation 2x10  Bird dog 2x10 BIL Standing horizontal abduction with RTB 2x10   Neuromuscular re-ed: Green band paloff press 2x10 bilat with TrA Supine marching with TrA x 10 (w/n hip precautions) Supine alt UE/LE with TrA 2x10 (w/n hip precautions) Rows with BTB single arm in staggered stance 2x10 bilat    01/13/24 Therapeutic Exercise: Cat/camel 2x10 Quadruped CT rotation 2x10  Bird dog 2x10 BIL   Neuromuscular re-ed: Green band paloff press 2x10 bilat with TrA Reverse chop with GTB x10 bilat Rows with BTB single arm in staggered stance 2x10 bilat  Manual Therapy:  Pt received STM to bilat  lower thoracic and lumbar paraspinals in prone      [facility] Adult PT Treatment:                                                DATE: 01/07/24 Therapeutic Exercise: Cat/camel x12 Kneeling CT rotation x8 BIL  Bird dog 2x8 BIL HEP update + education/handout  Neuromuscular re-ed: Partial get up 2x8 BIL cues for appropriate form/sequencing, breath control, core contraction,  appropriate ROM Staggered mid row, blue band x10 slow; 2x5 with quick concentric slow eccentric cues for elbow mechanics and reducing compensations Paloff + rotational press towards contralateral shoulder greenband 2x8 BIL     [facility] Adult  PT Treatment:                                                DATE: 01/03/24 Therapeutic Exercise: Cat/camel 2x10 cues for UE positioning Quadruped CT rotation x12 BIL  Standing thoracic ext w/ foam roll at wall x12 HEP discussion/education  Neuromuscular re-ed: Staggered low>high rotational press red band 2x8 BIL cues for comfortable ROM and motor control  Waist>shoulder red band chest press 2x10 BIL for improved shoulder stability Bird dog x8 BIL (noted inc instability LLE/RUE) Green band paloff + OH press x8 BIL     PATIENT EDUCATION:  Education details: rationale for interventions, HEP, and exercise form. Person educated: Patient Education method: Explanation, Demonstration, Tactile cues, Verbal cues Education comprehension: verbalized understanding, returned demonstration, verbal cues required, tactile cues required, and needs further education     HOME EXERCISE PROGRAM: Access Code: [email or web address] URL: [email or web address] Date: 01/21/2024 Prepared by: Fransisco Hertz  Program Notes - with reverse chop, please disregard pelvic floor portion of comments, and perform as done in clinic  Exercises - Cat Cow  - 1 x daily - 7 x weekly - 2-3 sets - 6-8 reps - Quadruped Thoracic Rotation Full Range with Hand on Neck  - 1 x daily - 7 x weekly - 2-3 sets - 6-8 reps - Bird Dog  - 5 x weekly - 2 sets - 8 reps - Standing Anti-Rotation Press with Anchored Resistance  - 5 x weekly - 1-2 sets - 8 reps - Split Stance Shoulder Row with Resistance  - 3-4 x weekly - 2-3 sets - 8-10 reps - Chest Press with Anchored Resistance  - 3-4 x weekly - 1-2 sets - 8 reps - Reverse Chop with Pelvic Floor Contraction  - 3-4 x weekly - 1-2 sets - 8 reps -  Standing Hip Hinge  - 3-4 x weekly - 2 sets - 10 reps - Standing Row with Resistance with Anchored Resistance at Chest Height Palms Down  - 3-4 x weekly - 2 sets - 10 reps    ASSESSMENT:  CLINICAL IMPRESSION: 01/21/2024 Pt arrives w/ report of a bit of a flare with increased yard work, but improved today. Continuing to work on [employer] strength/stability with emphasis on pulling movements to simulate lifting/yard tasks. Also introducing hinge movements today which he tolerates well but requires cues to mitigate more of a squat pattern. In discussion w/ pt he endorses progress w/ PT thus far, improved tolerance to golfing and feels more flexible but continues to have pain with heavier and more repetitive tasks. Anticipate checking goals in next couple of visits, in discussion with pt today may benefit from extension of POC to maximize functional tolerance, pending goals assessment. No adverse events, reports feeling improved on departure. Pt departs today's session in no acute distress, all voiced questions/concerns addressed appropriately from PT perspective.      OBJECTIVE IMPAIRMENTS: decreased activity tolerance, decreased endurance, decreased mobility, decreased ROM, decreased strength, hypomobility, increased fascial restrictions, impaired UE functional use, and pain.   ACTIVITY LIMITATIONS: lifting, sleeping, bed mobility, and reaching laterally  PARTICIPATION LIMITATIONS:  golf  PERSONAL FACTORS: Time since onset of injury/illness/exacerbation and 3+ comorbidities: L THR, OA, CVA, pulmonary sarcoidosis and vestibular issues on L side   are also affecting patient's functional outcome.   REHAB POTENTIAL: Good  CLINICAL DECISION MAKING: Evolving/moderate complexity  EVALUATION  COMPLEXITY: Moderate   GOALS:  SHORT TERM GOALS: Target date: 01/06/2024   Pt will be independent and compliant with HEP for improved pain, ROM, strength, and function.  Baseline: 01/03/24: reports  daily HEP adherence Goal status: MET  2.  Pt will demo lumbar SB'ing AROM to be [facility] bilat for improved stiffness and mobility Baseline:  01/03/24: see ROM chart above Goal status: MET  3.  Pt will report at least a 25% improvement in pain and sx's overall.   Baseline:  01/03/24: 70% Goal status: MET    LONG TERM GOALS: Target date: 01/27/2024  Pt will be able to sleep at least 5/7 nights/wk without pain disturbance.  Baseline:  Goal status: INITIAL  2.  Pt will report at least a 70% improvement overall in pain and sx's.   Baseline:  Goal status: INITIAL  3.  Pt will demo improved core strength by progressing with core exercises without adverse effects for improved core stability with functional mobility, daily activities, and golf.  Baseline:  Goal status: INITIAL  4.  Pt will be able to reach overhead and laterally without significant shoulder pain.  Baseline:  Goal status: INITIAL  5.  Pt will report at least a 70% reduction in pain with R rotation for improved pain with playing golf.  Baseline:  Goal status: INITIAL    PLAN:  PT FREQUENCY: 2x/week  PT DURATION: 6 weeks  PLANNED INTERVENTIONS: 97164- PT Re-evaluation, 97110-Therapeutic exercises, 97530- Therapeutic activity, 97112- Neuromuscular re-education, 97535- Self Care, 16109- Manual therapy, [phone]- Gait training, [phone]- Aquatic Therapy, [phone]- Ultrasound, Patient/Family education, Balance training, Stair training, Taping, Dry Needling, Joint mobilization, Spinal mobilization, Cryotherapy, and Moist heat.  PLAN FOR NEXT SESSION:  recommend continuing with periscapular and core strengthening, challenging rotation and antirotation stability within pt tolerance to work towards his goal of improved comfort w/ golf. Consider working more on pulling and lifting tasks as able/appropriate to improve tolerance to heavier activities around the home and yardwork   Ashley Murrain PT, DPT 01/21/2024 12:39 PM

## 2024-01-21 ENCOUNTER — Ambulatory Visit (HOSPITAL_BASED_OUTPATIENT_CLINIC_OR_DEPARTMENT_OTHER): Payer: PPO | Attending: Sports Medicine | Admitting: Physical Therapy

## 2024-01-21 ENCOUNTER — Encounter (HOSPITAL_BASED_OUTPATIENT_CLINIC_OR_DEPARTMENT_OTHER): Payer: Self-pay | Admitting: Physical Therapy

## 2024-01-21 DIAGNOSIS — M25512 Pain in left shoulder: Secondary | ICD-10-CM | POA: Insufficient documentation

## 2024-01-21 DIAGNOSIS — M546 Pain in thoracic spine: Secondary | ICD-10-CM | POA: Insufficient documentation

## 2024-01-21 DIAGNOSIS — M6281 Muscle weakness (generalized): Secondary | ICD-10-CM | POA: Insufficient documentation

## 2024-01-21 DIAGNOSIS — M5459 Other low back pain: Secondary | ICD-10-CM | POA: Diagnosis not present

## 2024-01-21 DIAGNOSIS — M25511 Pain in right shoulder: Secondary | ICD-10-CM | POA: Diagnosis not present

## 2024-01-24 ENCOUNTER — Ambulatory Visit (HOSPITAL_BASED_OUTPATIENT_CLINIC_OR_DEPARTMENT_OTHER): Payer: PPO | Admitting: Physical Therapy

## 2024-01-24 ENCOUNTER — Encounter (HOSPITAL_BASED_OUTPATIENT_CLINIC_OR_DEPARTMENT_OTHER): Payer: Self-pay | Admitting: Physical Therapy

## 2024-01-24 DIAGNOSIS — M546 Pain in thoracic spine: Secondary | ICD-10-CM

## 2024-01-24 DIAGNOSIS — M6281 Muscle weakness (generalized): Secondary | ICD-10-CM

## 2024-01-24 DIAGNOSIS — M5459 Other low back pain: Secondary | ICD-10-CM | POA: Diagnosis not present

## 2024-01-24 DIAGNOSIS — M25511 Pain in right shoulder: Secondary | ICD-10-CM

## 2024-01-24 NOTE — Therapy (Signed)
 OUTPATIENT PHYSICAL THERAPY TREATMENT   Patient Name: Jason Weaver MRN: [id number] DOB:[birth year], 68 y.o., male Today's Date: 01/24/2024  END OF SESSION:  PT End of Session - 01/24/24 1112     Visit Number 11    Number of Visits 15    Date for PT Re-Evaluation 02/14/24    Authorization Type HTA    PT Start Time 1110    PT Stop Time 1200    PT Time Calculation (min) 50 min    Activity Tolerance Patient tolerated treatment well    Behavior During Therapy [facility] for tasks assessed/performed               Past Medical History:  Diagnosis Date   Cardiomyopathy, nonischemic (HCC)    Diverticulosis of colon    ED (erectile dysfunction)    Gout    Hearing loss    Hx of colonic polyps    Hypercholesteremia 12/05/2018   Hyperlipidemia    Nasal septal deviation    Obesity    OSA (obstructive sleep apnea)    PAF (paroxysmal atrial fibrillation) (HCC)    Pulmonary sarcoidosis (HCC)  1997    biopsy proven   Past Surgical History:  Procedure Laterality Date   CATARACT EXTRACTION     HERNIA REPAIR  2009   LUNG BIOPSY  1997   TEE WITHOUT CARDIOVERSION N/A 11/25/2018   Procedure: TRANSESOPHAGEAL ECHOCARDIOGRAM (TEE);  Surgeon: Elder Negus, MD;  Location[facility];  Service: Cardiovascular;  Laterality: N/A;   TONSILLECTOMY  1979   Patient Active Problem List   Diagnosis Date Noted   Chronic low back pain 03/24/2021   Low testosterone 03/24/2021   Meniere's disease 03/23/2020   Acquired nasal septal defect 10/06/2019   CVA (cerebrovascular accident) (HCC) 11/14/2018   Numbness and tingling of foot 10/20/2018   Insomnia 10/20/2018   Erectile dysfunction 10/20/2018   Anal fissure 10/20/2018   Cold sore 10/20/2018   Osteoarthritis 04/20/2014   Allergic rhinitis 05/29/2012   OSA (obstructive sleep apnea) 06/04/2011   Pulmonary sarcoidosis (HCC) 01/11/2011   Gout 11/11/2009   Diverticulosis of large intestine 11/11/2009   History of colonic polyps 11/11/2009    Dyslipidemia with statin intolerance 10/24/2009   Deafness, left 08/23/2009     REFERRING PROVIDER: Richardean Sale, DO  REFERRING DIAG: M54.50,G89.29 (ICD-10-CM) - Chronic right-sided low back pain without sciatica M51.35 (ICD-10-CM) - DDD (degenerative disc disease), thoracolumbar M54.6,G89.29 (ICD-10-CM) - Chronic right-sided thoracic back pain M25.511,G89.29,M25.512 (ICD-10-CM) - Chronic pain of both shoulders  Rationale for Evaluation and Treatment: Rehabilitation  THERAPY DIAG:  Other low back pain  Pain in thoracic spine  Bilateral shoulder pain, unspecified chronicity  Muscle weakness (generalized)  ONSET DATE: 09/23/2023  PT order  SUBJECTIVE:  SUBJECTIVE STATEMENT: Pt states he has been doing a lot of yard work including 3-4 hours at a time.  He reports having pain after yard work. Worst pain is after playing golf.  He feels like he has a constantly pulled muscle.  He states it gets better, and then the pain returns.  Pt feels the area when he rolls in bed.   He has disturbed sleep.  Pt states he wants to improve strength.  Pt states he is more upright.  Pt reports he is able to reach overhead and laterally without significant shoulder pain. Pt has had acupuncture in the past and responded well.  He asked about dry needling.       PERTINENT HISTORY:  Chronic back pain R THR with posterolateral approach in 09/2022 OA CVA in 2020, left ear deafness, vestibular issues on L side  Pulmonary sarcoidosis    PAIN:  NPRS:  3-4/10 current, 6/10 worst, 1-2/10 best Location:  Lumbar  NPRS:  5/10 current, 6-7/10 worst, 2/10 best Location:  R sided thoracic  NPRS:  3-4/10 current, 7/10 worst, 1-2/10 best Location: R / L  Pt is R handed dominant  PRECAUTIONS: Other: R THR with  posterolateral approach 12/23, CVA, vestibular issues on L side, pulmonary sarcoidosis   WEIGHT BEARING RESTRICTIONS: No  FALLS:  Has patient fallen in last 6 months? No  LIVING ENVIRONMENT: Lives with: lives with their spouse Lives in: 1 story home Stairs: 2 steps to enter without rail.  Pt has a ramp also.   OCCUPATION: Pt is retired.  PLOF: Independent.  Pt typically plays a couple of rounds a golf per week.  Pt works out it in [employer] 3 days per week.   PATIENT GOALS: to not have pain with activities, to play golf without pain and stiffness   OBJECTIVE:  Note: Objective measures were completed at Evaluation unless otherwise noted.  DIAGNOSTIC FINDINGS:  X rays on 09/23/2023: FINDINGS: There is no evidence of lumbar spine fracture. Alignment is normal. Degenerative changes at each lumbar level with marginal osteophytes and disc space narrowing. Findings most severe at L5-S1.   IMPRESSION: Degenerative changes. No acute osseous abnormalities.  PA note indicated No acute fx or dislocation. decreased anterior height of T11 vertebral body with a hx of fall 2 months ago.  multiple areas of anterior vertebral spurring t/o thoracolumbar spine.    PALPATION: Pt has moderate + tightness in lower thoracic and upper lumbar paraspinals and moderate tightness in mid to lower lumbar paraspinals.  Pt had no tenderness with palpatoin  LUMBAR / Thoracic ROM:   AROM Lumbar Thoracic 2/27 01/03/24 lumbar  Lumbar 4/4 Thoracic 4/4  Flexion WFL WFL     Extension WFL 50%   WFL  Right lateral flexion 90% WFL Just above knee joint 90%   Left lateral flexion 60% WFL Just above knee joint 90%   Right rotation WFL 75%   WFL  Left rotation WFL 75%   WFL   (Blank rows = not tested) Pt reports some tightness with lumbar ROM though no pain.   UPPER EXTREMITY ROM:     Active  Right eval Left eval Right 2/27 Left 2/27 R/L 01/03/24  Shoulder flexion 157 174   164 / 172  Shoulder scaption  163 175     Shoulder abduction 148 168     Shoulder ER   81 71   Shoulder IR   67 79                                                     (  Blank rows = not tested)  MMT:    MMT Right eval Left eval Right 2/27 Left 2/27 Right 4/4  Hip flexion 5/5 5/5     Hip extension       Hip abduction       Shoulder flexion 5/5 5/5     Shoulder abduction 5/5 5/5     Shoulder scaption 5/5 5/5     Knee flexion       Knee extension 5/5 5/5     Ankle dorsiflexion       Ankle plantarflexion       Shoulder ER   4+/5 with pain 5/5 with slight pain 5/5 with pain  Shoulder IR   5/5 5/5    (Blank rows = not tested)      TREATMENT DATE:                                                                                                                               01/24/2024 Reviewed current function, pain levels, and response to prior Rx.   Assessed shoulder ER strength and lumbar/thoracic ROM.  See above  Modified Oswestry:  Initial/Current:  12% / 22%.  Pt states he is limited in certain activities due to L knee and ankle.  Supine alt UE/LE x 10 Q ped birddogs 2x10 Green band paloff + OH press 2x10 BIL 10# suitcase carry up/down hallway for 1 rep with cues for posture      [facility] Adult PT Treatment:                                                DATE: 01/21/24  Neuromuscular re-ed: [place] Si band paloff x12 BIL Green band paloff + OH press x10 BIL  Reverse chop green band x10 BIL Green band row w/ shoulder abd 90 deg 2x12 cues for appropriate form HEP update + education  Therapeutic Activity: Green band bent lawnmower row x8, x10 BIL  BW hinge 2x10 cues for trunk/hip mechanics  10# suitcase carry 4x2ft BIL cues for posture/pacing    01/17/24 Manual Therapy:  Pt received STM to bilat  lower thoracic and lumbar paraspinals in prone   Therapeutic Exercise: Quadruped CT rotation 2x10  Bird dog 2x10 BIL Standing horizontal abduction with RTB 2x10   Neuromuscular re-ed: Green  band paloff press 2x10 bilat with TrA Supine marching with TrA x 10 (w/n hip precautions) Supine alt UE/LE with TrA 2x10 (w/n hip precautions) Rows with BTB single arm in staggered stance 2x10 bilat    01/13/24 Therapeutic Exercise: Cat/camel 2x10 Quadruped CT rotation 2x10  Bird dog 2x10 BIL   Neuromuscular re-ed: Green band paloff press 2x10 bilat with TrA Reverse chop with GTB x10 bilat Rows with BTB single arm in staggered stance 2x10 bilat  Manual Therapy:  Pt received STM to  bilat  lower thoracic and lumbar paraspinals in prone      [facility] Adult PT Treatment:                                                DATE: 01/07/24 Therapeutic Exercise: Cat/camel x12 Kneeling CT rotation x8 BIL  Bird dog 2x8 BIL HEP update + education/handout  Neuromuscular re-ed: Partial get up 2x8 BIL cues for appropriate form/sequencing, breath control, core contraction, appropriate ROM Staggered mid row, blue band x10 slow; 2x5 with quick concentric slow eccentric cues for elbow mechanics and reducing compensations Paloff + rotational press towards contralateral shoulder greenband 2x8 BIL      PATIENT EDUCATION:  Education details:  Pt had questions concerning dry needling.  PT answered questions and educated pt concerning the process and rationale of dry needling.  PT spent time educating pt concerning POC and scope of PT.  Objective findings, goal progress, rationale for interventions, HEP, and exercise form. Person educated: Patient Education method: Explanation, Demonstration, Tactile cues, Verbal cues Education comprehension: verbalized understanding, returned demonstration, verbal cues required, tactile cues required, and needs further education     HOME EXERCISE PROGRAM: Access Code: [email or web address] URL: [email or web address] Date: 01/21/2024 Prepared by: Fransisco Hertz  Program Notes - with reverse chop, please disregard pelvic floor portion of comments, and perform as  done in clinic  Exercises - Cat Cow  - 1 x daily - 7 x weekly - 2-3 sets - 6-8 reps - Quadruped Thoracic Rotation Full Range with Hand on Neck  - 1 x daily - 7 x weekly - 2-3 sets - 6-8 reps - Bird Dog  - 5 x weekly - 2 sets - 8 reps - Standing Anti-Rotation Press with Anchored Resistance  - 5 x weekly - 1-2 sets - 8 reps - Split Stance Shoulder Row with Resistance  - 3-4 x weekly - 2-3 sets - 8-10 reps - Chest Press with Anchored Resistance  - 3-4 x weekly - 1-2 sets - 8 reps - Reverse Chop with Pelvic Floor Contraction  - 3-4 x weekly - 1-2 sets - 8 reps - Standing Hip Hinge  - 3-4 x weekly - 2 sets - 10 reps - Standing Row with Resistance with Anchored Resistance at Chest Height Palms Down  - 3-4 x weekly - 2 sets - 10 reps    ASSESSMENT:  CLINICAL IMPRESSION: Pt is progressing well in all areas.  Pt reports over 50% improvement overall in pain and sx's and 60% improvement in pain with rotation during golf swing.  Pt has improved tolerance to activity and is progressing well with core strength.  He has improved pain with golf though continues to have pain.  Pt performs a lot of yard work and has increased pain with yard work.  Pt demonstrates improved L lumbar SB AROM.  He has improved with thoracic AROM to be [facility]. Pt demonstrates improved R shoulder ER strength.  He is able to reach overhead and laterally without significant shoulder pain.  Pt actually has worsened self perceived disability though states his ankle and knee affected some of the activity questions.  Pt has met all STG's and LTG #4 and partially met LTG's #2,5.  Pt should benefit from skilled PT services to address ongoing goals and to assist in restoring desired level of function.       OBJECTIVE  IMPAIRMENTS: decreased activity tolerance, decreased endurance, decreased mobility, decreased ROM, decreased strength, hypomobility, increased fascial restrictions, impaired UE functional use, and pain.   ACTIVITY LIMITATIONS:  lifting, sleeping, bed mobility, and reaching laterally  PARTICIPATION LIMITATIONS:  golf  PERSONAL FACTORS: Time since onset of injury/illness/exacerbation and 3+ comorbidities: L THR, OA, CVA, pulmonary sarcoidosis and vestibular issues on L side   are also affecting patient's functional outcome.   REHAB POTENTIAL: Good  CLINICAL DECISION MAKING: Evolving/moderate complexity  EVALUATION COMPLEXITY: Moderate   GOALS:  SHORT TERM GOALS: Target date: 01/06/2024   Pt will be independent and compliant with HEP for improved pain, ROM, strength, and function.  Baseline: 01/03/24: reports daily HEP adherence Goal status: MET  2.  Pt will demo lumbar SB'ing AROM to be [facility] bilat for improved stiffness and mobility Baseline:  01/03/24: see ROM chart above Goal status: MET  3.  Pt will report at least a 25% improvement in pain and sx's overall.   Baseline:  01/03/24: 70% Goal status: MET    LONG TERM GOALS: Target date: 02/14/2024  Pt will be able to sleep at least 5/7 nights/wk without pain disturbance.  Baseline:  Goal status: ONGOING  2.  Pt will report at least a 70% improvement overall in pain and sx's.   Baseline:  Goal status: PARTIALLY MET   4/4  3.  Pt will demo improved core strength by progressing with core exercises without adverse effects for improved core stability with functional mobility, daily activities, and golf.  Baseline:  Goal status: PROGRESSING  4.  Pt will be able to reach overhead and laterally without significant shoulder pain.  Baseline:  Goal status: GOAL MET  4/4  5.  Pt will report at least a 70% reduction in pain with R rotation for improved pain with playing golf.  Baseline:  Goal status: PARTIALLY MET  4/4    PLAN:  PT FREQUENCY: 1-2x/wk  PT DURATION: 3 weeks  PLANNED INTERVENTIONS: 97164- PT Re-evaluation, 97110-Therapeutic exercises, 97530- Therapeutic activity, 97112- Neuromuscular re-education, 97535- Self Care, 16109- Manual  therapy, [phone]- Gait training, [phone]- Aquatic Therapy, [phone]- Ultrasound, Patient/Family education, Balance training, Stair training, Taping, Dry Needling, Joint mobilization, Spinal mobilization, Cryotherapy, and Moist heat.  PLAN FOR NEXT SESSION:  recommend continuing with periscapular and core strengthening, challenging rotation and antirotation stability within pt tolerance to work towards his goal of improved comfort w/ golf. Consider working more on pulling and lifting tasks as able/appropriate to improve tolerance to heavier activities around the home and yardwork.  Pt requested dry needling and pt may try dry needling next visit.    Audie Clear III PT, DPT 01/24/24 12:15 PM

## 2024-01-28 ENCOUNTER — Ambulatory Visit (HOSPITAL_BASED_OUTPATIENT_CLINIC_OR_DEPARTMENT_OTHER): Payer: PPO | Admitting: Physical Therapy

## 2024-01-28 DIAGNOSIS — M546 Pain in thoracic spine: Secondary | ICD-10-CM

## 2024-01-28 DIAGNOSIS — M5459 Other low back pain: Secondary | ICD-10-CM | POA: Diagnosis not present

## 2024-01-28 DIAGNOSIS — M25511 Pain in right shoulder: Secondary | ICD-10-CM

## 2024-01-28 DIAGNOSIS — M6281 Muscle weakness (generalized): Secondary | ICD-10-CM

## 2024-01-28 NOTE — Therapy (Signed)
 OUTPATIENT PHYSICAL THERAPY TREATMENT   Patient Name: Jason Weaver MRN: [id number] DOB:1956/10/09, 68 y.o., male Today's Date: 01/28/2024  END OF SESSION:  PT End of Session - 01/28/24 1020     Visit Number 12    Number of Visits 15    Date for PT Re-Evaluation 02/14/24    Authorization Type HTA    PT Start Time 1020    PT Stop Time 1100    PT Time Calculation (min) 40 min    Activity Tolerance Patient tolerated treatment well    Behavior During Therapy [facility] for tasks assessed/performed              Past Medical History:  Diagnosis Date   Cardiomyopathy, nonischemic (HCC)    Diverticulosis of colon    ED (erectile dysfunction)    Gout    Hearing loss    Hx of colonic polyps    Hypercholesteremia 12/05/2018   Hyperlipidemia    Nasal septal deviation    Obesity    OSA (obstructive sleep apnea)    PAF (paroxysmal atrial fibrillation) (HCC)    Pulmonary sarcoidosis (HCC)  1997    biopsy proven   Past Surgical History:  Procedure Laterality Date   CATARACT EXTRACTION     HERNIA REPAIR  2009   LUNG BIOPSY  1997   TEE WITHOUT CARDIOVERSION N/A 11/25/2018   Procedure: TRANSESOPHAGEAL ECHOCARDIOGRAM (TEE);  Surgeon: Elder Negus, MD;  Location: [facility] ENDOSCOPY;  Service: Cardiovascular;  Laterality: N/A;   TONSILLECTOMY  1979   Patient Active Problem List   Diagnosis Date Noted   Chronic low back pain 03/24/2021   Low testosterone 03/24/2021   Meniere's disease 03/23/2020   Acquired nasal septal defect 10/06/2019   CVA (cerebrovascular accident) (HCC) 11/14/2018   Numbness and tingling of foot 10/20/2018   Insomnia 10/20/2018   Erectile dysfunction 10/20/2018   Anal fissure 10/20/2018   Cold sore 10/20/2018   Osteoarthritis 04/20/2014   Allergic rhinitis 05/29/2012   OSA (obstructive sleep apnea) 06/04/2011   Pulmonary sarcoidosis (HCC) 01/11/2011   Gout 11/11/2009   Diverticulosis of large intestine 11/11/2009   History of colonic polyps 11/11/2009    Dyslipidemia with statin intolerance 10/24/2009   Deafness, left 08/23/2009     REFERRING PROVIDER: Richardean Sale, DO  REFERRING DIAG: M54.50,G89.29 (ICD-10-CM) - Chronic right-sided low back pain without sciatica M51.35 (ICD-10-CM) - DDD (degenerative disc disease), thoracolumbar M54.6,G89.29 (ICD-10-CM) - Chronic right-sided thoracic back pain M25.511,G89.29,M25.512 (ICD-10-CM) - Chronic pain of both shoulders  Rationale for Evaluation and Treatment: Rehabilitation  THERAPY DIAG:  Other low back pain  Pain in thoracic spine  Bilateral shoulder pain, unspecified chronicity  Muscle weakness (generalized)  ONSET DATE: 09/23/2023  PT order  SUBJECTIVE:  SUBJECTIVE STATEMENT: Pt reports a muscle on his right side that is perpetually tight. Pt is pretty good otherwise.     PERTINENT HISTORY:  Chronic back pain R THR with posterolateral approach in 09/2022 OA CVA in 2020, left ear deafness, vestibular issues on L side  Pulmonary sarcoidosis    PAIN:  NPRS:  3-4/10 current, 6/10 worst, 1-2/10 best Location:  Lumbar  NPRS:  5/10 current, 6-7/10 worst, 2/10 best Location:  R sided thoracic  NPRS:  3-4/10 current, 7/10 worst, 1-2/10 best Location: R / L  Pt is R handed dominant  PRECAUTIONS: Other: R THR with posterolateral approach 12/23, CVA, vestibular issues on L side, pulmonary sarcoidosis   WEIGHT BEARING RESTRICTIONS: No  FALLS:  Has patient fallen in last 6 months? No  LIVING ENVIRONMENT: Lives with: lives with their spouse Lives in: 1 story home Stairs: 2 steps to enter without rail.  Pt has a ramp also.   OCCUPATION: Pt is retired.  PLOF: Independent.  Pt typically plays a couple of rounds a golf per week.  Pt works out it in [employer] 3 days per week.   PATIENT  GOALS: to not have pain with activities, to play golf without pain and stiffness   OBJECTIVE:  Note: Objective measures were completed at Evaluation unless otherwise noted.  DIAGNOSTIC FINDINGS:  X rays on 09/23/2023: FINDINGS: There is no evidence of lumbar spine fracture. Alignment is normal. Degenerative changes at each lumbar level with marginal osteophytes and disc space narrowing. Findings most severe at L5-S1.   IMPRESSION: Degenerative changes. No acute osseous abnormalities.  PA note indicated No acute fx or dislocation. decreased anterior height of T11 vertebral body with a hx of fall 2 months ago.  multiple areas of anterior vertebral spurring t/o thoracolumbar spine.    PALPATION: Pt has moderate + tightness in lower thoracic and upper lumbar paraspinals and moderate tightness in mid to lower lumbar paraspinals.  Pt had no tenderness with palpatoin  LUMBAR / Thoracic ROM:   AROM Lumbar Thoracic 2/27 01/03/24 lumbar  Lumbar 4/4 Thoracic 4/4  Flexion WFL WFL     Extension WFL 50%   WFL  Right lateral flexion 90% WFL Just above knee joint 90%   Left lateral flexion 60% WFL Just above knee joint 90%   Right rotation WFL 75%   WFL  Left rotation WFL 75%   WFL   (Blank rows = not tested) Pt reports some tightness with lumbar ROM though no pain.   UPPER EXTREMITY ROM:     Active  Right eval Left eval Right 2/27 Left 2/27 R/L 01/03/24  Shoulder flexion 157 174   164 / 172  Shoulder scaption 163 175     Shoulder abduction 148 168     Shoulder ER   81 71   Shoulder IR   67 79                                                     (Blank rows = not tested)  MMT:    MMT Right eval Left eval Right 2/27 Left 2/27 Right 4/4  Hip flexion 5/5 5/5     Hip extension       Hip abduction       Shoulder flexion 5/5 5/5     Shoulder abduction 5/5 5/5  Shoulder scaption 5/5 5/5     Knee flexion       Knee extension 5/5 5/5     Ankle dorsiflexion       Ankle  plantarflexion       Shoulder ER   4+/5 with pain 5/5 with slight pain 5/5 with pain  Shoulder IR   5/5 5/5    (Blank rows = not tested)      TREATMENT DATE:                                                                                                                               01/28/24 Seated pball flexion 5x5" Seated pball lateral flexion 5x5" Manual therapy  Skilled assessment and palpation for TPDN  Trigger Point Dry Needling  Initial Treatment: Pt instructed on Dry Needling rational, procedures, and possible side effects. Pt instructed to expect mild to moderate muscle soreness later in the day and/or into the next day.  Pt instructed in methods to reduce muscle soreness. Pt instructed to continue prescribed HEP. Patient was educated on signs and symptoms of infection and other risk factors and advised to seek medical attention should they occur.  Patient verbalized understanding of these instructions and education.   Patient Verbal Consent Given: Yes Education Handout Provided: Yes Muscles Treated: R glute max/med, R thoracic paraspinals Electrical Stimulation Performed: No Treatment Response/Outcome: twitch response, decreased muscle tension  Standing diagonal chops 10# cables 2x10 high to low and low to high Standing lawnmower 10# cables 2x10  Side step anti rotation 10# cables x10 R&L Row using bar 15# cables reactive iso x10  01/24/2024 Reviewed current function, pain levels, and response to prior Rx.   Assessed shoulder ER strength and lumbar/thoracic ROM.  See above  Modified Oswestry:  Initial/Current:  12% / 22%.  Pt states he is limited in certain activities due to L knee and ankle.  Supine alt UE/LE x 10 Q ped birddogs 2x10 Green band paloff + OH press 2x10 BIL 10# suitcase carry up/down hallway for 1 rep with cues for posture      [facility] Adult PT Treatment:                                                DATE: 01/21/24  Neuromuscular re-ed: [place] Si band  paloff x12 BIL Green band paloff + OH press x10 BIL  Reverse chop green band x10 BIL Green band row w/ shoulder abd 90 deg 2x12 cues for appropriate form HEP update + education  Therapeutic Activity: Green band bent lawnmower row x8, x10 BIL  BW hinge 2x10 cues for trunk/hip mechanics  10# suitcase carry 4x25ft BIL cues for posture/pacing    01/17/24 Manual Therapy:  Pt received STM to bilat  lower thoracic and lumbar paraspinals in prone   Therapeutic Exercise:  Quadruped CT rotation 2x10  Bird dog 2x10 BIL Standing horizontal abduction with RTB 2x10   Neuromuscular re-ed: Green band paloff press 2x10 bilat with TrA Supine marching with TrA x 10 (w/n hip precautions) Supine alt UE/LE with TrA 2x10 (w/n hip precautions) Rows with BTB single arm in staggered stance 2x10 bilat    01/13/24 Therapeutic Exercise: Cat/camel 2x10 Quadruped CT rotation 2x10  Bird dog 2x10 BIL   Neuromuscular re-ed: Green band paloff press 2x10 bilat with TrA Reverse chop with GTB x10 bilat Rows with BTB single arm in staggered stance 2x10 bilat  Manual Therapy:  Pt received STM to bilat  lower thoracic and lumbar paraspinals in prone      [facility] Adult PT Treatment:                                                DATE: 01/07/24 Therapeutic Exercise: Cat/camel x12 Kneeling CT rotation x8 BIL  Bird dog 2x8 BIL HEP update + education/handout  Neuromuscular re-ed: Partial get up 2x8 BIL cues for appropriate form/sequencing, breath control, core contraction, appropriate ROM Staggered mid row, blue band x10 slow; 2x5 with quick concentric slow eccentric cues for elbow mechanics and reducing compensations Paloff + rotational press towards contralateral shoulder greenband 2x8 BIL      PATIENT EDUCATION:  Education details:  Pt had questions concerning dry needling.  PT answered questions and educated pt concerning the process and rationale of dry needling.  PT spent time educating pt  concerning POC and scope of PT.  Objective findings, goal progress, rationale for interventions, HEP, and exercise form. Person educated: Patient Education method: Explanation, Demonstration, Tactile cues, Verbal cues Education comprehension: verbalized understanding, returned demonstration, verbal cues required, tactile cues required, and needs further education     HOME EXERCISE PROGRAM: Access Code: [email or web address] URL: [email or web address] Date: 01/21/2024 Prepared by: Fransisco Hertz  Program Notes - with reverse chop, please disregard pelvic floor portion of comments, and perform as done in clinic  Exercises - Cat Cow  - 1 x daily - 7 x weekly - 2-3 sets - 6-8 reps - Quadruped Thoracic Rotation Full Range with Hand on Neck  - 1 x daily - 7 x weekly - 2-3 sets - 6-8 reps - Bird Dog  - 5 x weekly - 2 sets - 8 reps - Standing Anti-Rotation Press with Anchored Resistance  - 5 x weekly - 1-2 sets - 8 reps - Split Stance Shoulder Row with Resistance  - 3-4 x weekly - 2-3 sets - 8-10 reps - Chest Press with Anchored Resistance  - 3-4 x weekly - 1-2 sets - 8 reps - Reverse Chop with Pelvic Floor Contraction  - 3-4 x weekly - 1-2 sets - 8 reps - Standing Hip Hinge  - 3-4 x weekly - 2 sets - 10 reps - Standing Row with Resistance with Anchored Resistance at Chest Height Palms Down  - 3-4 x weekly - 2 sets - 10 reps    ASSESSMENT:  CLINICAL IMPRESSION: Performed trial of TPDN this session to address pt's low and midback trigger points. Good response. Pt remains hypomobile in his thoracic spine. Continued midback and core strengthening this session. Increased resistance to using cables.    OBJECTIVE IMPAIRMENTS: decreased activity tolerance, decreased endurance, decreased mobility, decreased ROM, decreased strength, hypomobility, increased fascial restrictions, impaired UE functional use, and pain.  ACTIVITY LIMITATIONS: lifting, sleeping, bed mobility, and reaching  laterally  PARTICIPATION LIMITATIONS:  golf  PERSONAL FACTORS: Time since onset of injury/illness/exacerbation and 3+ comorbidities: L THR, OA, CVA, pulmonary sarcoidosis and vestibular issues on L side   are also affecting patient's functional outcome.   REHAB POTENTIAL: Good  CLINICAL DECISION MAKING: Evolving/moderate complexity  EVALUATION COMPLEXITY: Moderate   GOALS:  SHORT TERM GOALS: Target date: 01/06/2024   Pt will be independent and compliant with HEP for improved pain, ROM, strength, and function.  Baseline: 01/03/24: reports daily HEP adherence Goal status: MET  2.  Pt will demo lumbar SB'ing AROM to be [facility] bilat for improved stiffness and mobility Baseline:  01/03/24: see ROM chart above Goal status: MET  3.  Pt will report at least a 25% improvement in pain and sx's overall.   Baseline:  01/03/24: 70% Goal status: MET    LONG TERM GOALS: Target date: 02/14/2024  Pt will be able to sleep at least 5/7 nights/wk without pain disturbance.  Baseline:  Goal status: ONGOING  2.  Pt will report at least a 70% improvement overall in pain and sx's.   Baseline:  Goal status: PARTIALLY MET   4/4  3.  Pt will demo improved core strength by progressing with core exercises without adverse effects for improved core stability with functional mobility, daily activities, and golf.  Baseline:  Goal status: PROGRESSING  4.  Pt will be able to reach overhead and laterally without significant shoulder pain.  Baseline:  Goal status: GOAL MET  4/4  5.  Pt will report at least a 70% reduction in pain with R rotation for improved pain with playing golf.  Baseline:  Goal status: PARTIALLY MET  4/4    PLAN:  PT FREQUENCY: 1-2x/wk  PT DURATION: 3 weeks  PLANNED INTERVENTIONS: 97164- PT Re-evaluation, 97110-Therapeutic exercises, 97530- Therapeutic activity, 97112- Neuromuscular re-education, 97535- Self Care, 44010- Manual therapy, [phone]- Gait training, [phone]- Aquatic  Therapy, [phone]- Ultrasound, Patient/Family education, Balance training, Stair training, Taping, Dry Needling, Joint mobilization, Spinal mobilization, Cryotherapy, and Moist heat.  PLAN FOR NEXT SESSION:  how was dry needling? recommend continuing with periscapular and core strengthening, challenging rotation and antirotation stability within pt tolerance to work towards his goal of improved comfort w/ golf. Consider working more on pulling and lifting tasks as able/appropriate to improve tolerance to heavier activities around the home and yardwork.    Saraiah Bhat April Ma L Donne Baley, PT, DPT 01/28/24 10:21 AM

## 2024-01-31 ENCOUNTER — Encounter (HOSPITAL_BASED_OUTPATIENT_CLINIC_OR_DEPARTMENT_OTHER): Payer: Self-pay | Admitting: Physical Therapy

## 2024-01-31 ENCOUNTER — Ambulatory Visit (HOSPITAL_BASED_OUTPATIENT_CLINIC_OR_DEPARTMENT_OTHER): Payer: PPO | Admitting: Physical Therapy

## 2024-01-31 DIAGNOSIS — M5459 Other low back pain: Secondary | ICD-10-CM | POA: Diagnosis not present

## 2024-01-31 DIAGNOSIS — M546 Pain in thoracic spine: Secondary | ICD-10-CM

## 2024-01-31 DIAGNOSIS — M6281 Muscle weakness (generalized): Secondary | ICD-10-CM

## 2024-01-31 DIAGNOSIS — M25511 Pain in right shoulder: Secondary | ICD-10-CM

## 2024-01-31 NOTE — Therapy (Signed)
 OUTPATIENT PHYSICAL THERAPY TREATMENT   Patient Name: Jason Weaver MRN: [id number] DOB:Nov 03, 1955, 68 y.o., male Today's Date: 01/31/2024  END OF SESSION:  PT End of Session - 01/31/24 1111     Visit Number 13    Number of Visits 15    Date for PT Re-Evaluation 02/14/24    Authorization Type HTA    PT Start Time 1110    PT Stop Time 1155    PT Time Calculation (min) 45 min    Activity Tolerance Patient tolerated treatment well    Behavior During Therapy Jason Weaver for tasks assessed/performed              Past Medical History:  Diagnosis Date   Cardiomyopathy, nonischemic (HCC)    Diverticulosis of colon    ED (erectile dysfunction)    Gout    Hearing loss    Hx of colonic polyps    Hypercholesteremia 12/05/2018   Hyperlipidemia    Nasal septal deviation    Obesity    OSA (obstructive sleep apnea)    PAF (paroxysmal atrial fibrillation) (HCC)    Pulmonary sarcoidosis (HCC)  1997    biopsy proven   Past Surgical History:  Procedure Laterality Date   CATARACT EXTRACTION     HERNIA REPAIR  2009   LUNG BIOPSY  1997   TEE WITHOUT CARDIOVERSION N/A 11/25/2018   Procedure: TRANSESOPHAGEAL ECHOCARDIOGRAM (TEE);  Surgeon: Elder Negus, MD;  Location: [facility] ENDOSCOPY;  Service: Cardiovascular;  Laterality: N/A;   TONSILLECTOMY  1979   Patient Active Problem List   Diagnosis Date Noted   Chronic low back pain 03/24/2021   Low testosterone 03/24/2021   Meniere's disease 03/23/2020   Acquired nasal septal defect 10/06/2019   CVA (cerebrovascular accident) (HCC) 11/14/2018   Numbness and tingling of foot 10/20/2018   Insomnia 10/20/2018   Erectile dysfunction 10/20/2018   Anal fissure 10/20/2018   Cold sore 10/20/2018   Osteoarthritis 04/20/2014   Allergic rhinitis 05/29/2012   OSA (obstructive sleep apnea) 06/04/2011   Pulmonary sarcoidosis (HCC) 01/11/2011   Gout 11/11/2009   Diverticulosis of large intestine 11/11/2009   History of colonic polyps 11/11/2009    Dyslipidemia with statin intolerance 10/24/2009   Deafness, left 08/23/2009     REFERRING PROVIDER: Richardean Sale, DO  REFERRING DIAG: M54.50,G89.29 (ICD-10-CM) - Chronic right-sided low back pain without sciatica M51.35 (ICD-10-CM) - DDD (degenerative disc disease), thoracolumbar M54.6,G89.29 (ICD-10-CM) - Chronic right-sided thoracic back pain M25.511,G89.29,M25.512 (ICD-10-CM) - Chronic pain of both shoulders  Rationale for Evaluation and Treatment: Rehabilitation  THERAPY DIAG:  Other low back pain  Pain in thoracic spine  Bilateral shoulder pain, unspecified chronicity  Muscle weakness (generalized)  ONSET DATE: 09/23/2023  PT order  SUBJECTIVE:  SUBJECTIVE STATEMENT: Pt states he felt really good after dry needling.  He went to play golf the following day and was unable to swing well.  Pt states he still has pain and stiffness with golf swing.  He states his thoracic pain is better though still there, but the lumbar feels much better.  ntreports a muscle on his right side that is perpetually tight. Pt is pretty good otherwise.     PERTINENT HISTORY:  Chronic back pain R THR with posterolateral approach in 09/2022 OA CVA in 2020, left ear deafness, vestibular issues on L side  Pulmonary sarcoidosis    PAIN:  NPRS:  0/10 current, 6/10 worst, 1-2/10 best Location:  Lumbar  NPRS:  3-4/10 with rotation, 6-7/10 worst, 2/10 best Location:  R sided thoracic  NPRS:  0/10 current, 7/10 worst, 0/10 best Location: R / L  Pt is R handed dominant  PRECAUTIONS: Other: R THR with posterolateral approach 12/23, CVA, vestibular issues on L side, pulmonary sarcoidosis   WEIGHT BEARING RESTRICTIONS: No  FALLS:  Has patient fallen in last 6 months? No  LIVING ENVIRONMENT: Lives with:  lives with their spouse Lives in: 1 story home Stairs: 2 steps to enter without rail.  Pt has a ramp also.   OCCUPATION: Pt is retired.  PLOF: Independent.  Pt typically plays a couple of rounds a golf per week.  Pt works out it in [employer] 3 days per week.   PATIENT GOALS: to not have pain with activities, to play golf without pain and stiffness   OBJECTIVE:  Note: Objective measures were completed at Evaluation unless otherwise noted.  DIAGNOSTIC FINDINGS:  X rays on 09/23/2023: FINDINGS: There is no evidence of lumbar spine fracture. Alignment is normal. Degenerative changes at each lumbar level with marginal osteophytes and disc space narrowing. Findings most severe at L5-S1.   IMPRESSION: Degenerative changes. No acute osseous abnormalities.  PA note indicated No acute fx or dislocation. decreased anterior height of T11 vertebral body with a hx of fall 2 months ago.  multiple areas of anterior vertebral spurring t/o thoracolumbar spine.    PALPATION: Pt has moderate + tightness in lower thoracic and upper lumbar paraspinals and moderate tightness in mid to lower lumbar paraspinals.  Pt had no tenderness with palpatoin  LUMBAR / Thoracic ROM:   AROM Lumbar Thoracic 2/27 01/03/24 lumbar  Lumbar 4/4 Thoracic 4/4  Flexion WFL WFL     Extension WFL 50%   WFL  Right lateral flexion 90% WFL Just above knee joint 90%   Left lateral flexion 60% WFL Just above knee joint 90%   Right rotation WFL 75%   WFL  Left rotation WFL 75%   WFL   (Blank rows = not tested) Pt reports some tightness with lumbar ROM though no pain.   UPPER EXTREMITY ROM:     Active  Right eval Left eval Right 2/27 Left 2/27 R/L 01/03/24  Shoulder flexion 157 174   164 / 172  Shoulder scaption 163 175     Shoulder abduction 148 168     Shoulder ER   81 71   Shoulder IR   67 79                                                     (Blank rows = not  tested)  MMT:    MMT Right eval Left eval  Right 2/27 Left 2/27 Right 4/4  Hip flexion 5/5 5/5     Hip extension       Hip abduction       Shoulder flexion 5/5 5/5     Shoulder abduction 5/5 5/5     Shoulder scaption 5/5 5/5     Knee flexion       Knee extension 5/5 5/5     Ankle dorsiflexion       Ankle plantarflexion       Shoulder ER   4+/5 with pain 5/5 with slight pain 5/5 with pain  Shoulder IR   5/5 5/5    (Blank rows = not tested)      TREATMENT DATE:                                                                                                                               01/31/24 Therapeutic Exercise:  Qped cat/camel 2x10 Cable Row 15# 2x10 Qped birddogs 2x10   Neuro Re-ed Activities: Standing diagonal chops 10# cables 2x10 high to low  Paloff press 10# cables 2x10 bilat Standing and supine Hz abd with RTB x 10 each   Manual Therapy: Pt received static cupping to bilat thoracic paraspinals and lumbar paraspinals in prone to improve soft tissue tightness and mobility and pain and to reduce myofascial adhesions and restrictions.      01/28/24 Seated pball flexion 5x5" Seated pball lateral flexion 5x5" Manual therapy  Skilled assessment and palpation for TPDN  Trigger Point Dry Needling  Initial Treatment: Pt instructed on Dry Needling rational, procedures, and possible side effects. Pt instructed to expect mild to moderate muscle soreness later in the day and/or into the next day.  Pt instructed in methods to reduce muscle soreness. Pt instructed to continue prescribed HEP. Patient was educated on signs and symptoms of infection and other risk factors and advised to seek medical attention should they occur.  Patient verbalized understanding of these instructions and education.   Patient Verbal Consent Given: Yes Education Handout Provided: Yes Muscles Treated: R glute max/med, R thoracic paraspinals Electrical Stimulation Performed: No Treatment Response/Outcome: twitch response, decreased  muscle tension  Standing diagonal chops 10# cables 2x10 high to low and low to high Standing lawnmower 10# cables 2x10  Side step anti rotation 10# cables x10 R&L Row using bar 15# cables reactive iso x10  01/24/2024 Reviewed current function, pain levels, and response to prior Rx.   Assessed shoulder ER strength and lumbar/thoracic ROM.  See above  Modified Oswestry:  Initial/Current:  12% / 22%.  Pt states he is limited in certain activities due to L knee and ankle.  Supine alt UE/LE x 10 Q ped birddogs 2x10 Green band paloff + OH press 2x10 BIL 10# suitcase carry up/down hallway for 1 rep with cues for posture      [facility] Adult PT Treatment:  DATE: 01/21/24  Neuromuscular re-ed: [place] Si band paloff x12 BIL Green band paloff + OH press x10 BIL  Reverse chop green band x10 BIL Green band row w/ shoulder abd 90 deg 2x12 cues for appropriate form HEP update + education  Therapeutic Activity: Green band bent lawnmower row x8, x10 BIL  BW hinge 2x10 cues for trunk/hip mechanics  10# suitcase carry 4x57ft BIL cues for posture/pacing    01/17/24 Manual Therapy:  Pt received STM to bilat  lower thoracic and lumbar paraspinals in prone   Therapeutic Exercise: Quadruped CT rotation 2x10  Bird dog 2x10 BIL Standing horizontal abduction with RTB 2x10   Neuromuscular re-ed: Green band paloff press 2x10 bilat with TrA Supine marching with TrA x 10 (w/n hip precautions) Supine alt UE/LE with TrA 2x10 (w/n hip precautions) Rows with BTB single arm in staggered stance 2x10 bilat    01/13/24 Therapeutic Exercise: Cat/camel 2x10 Quadruped CT rotation 2x10  Bird dog 2x10 BIL   Neuromuscular re-ed: Green band paloff press 2x10 bilat with TrA Reverse chop with GTB x10 bilat Rows with BTB single arm in staggered stance 2x10 bilat  Manual Therapy:  Pt received STM to bilat  lower thoracic and lumbar paraspinals in prone       PATIENT EDUCATION:  Education details:  mechanism and rationale of cupping.  POC, rationale for interventions, HEP, and exercise form. Person educated: Patient Education method: Explanation, Demonstration, Tactile cues, Verbal cues Education comprehension: verbalized understanding, returned demonstration, verbal cues required, tactile cues required, and needs further education     HOME EXERCISE PROGRAM: Access Code: [email or web address] URL: [email or web address] Date: 01/21/2024 Prepared by: Fransisco Hertz  Program Notes - with reverse chop, please disregard pelvic floor portion of comments, and perform as done in clinic  Exercises - Cat Cow  - 1 x daily - 7 x weekly - 2-3 sets - 6-8 reps - Quadruped Thoracic Rotation Full Range with Hand on Neck  - 1 x daily - 7 x weekly - 2-3 sets - 6-8 reps - Bird Dog  - 5 x weekly - 2 sets - 8 reps - Standing Anti-Rotation Press with Anchored Resistance  - 5 x weekly - 1-2 sets - 8 reps - Split Stance Shoulder Row with Resistance  - 3-4 x weekly - 2-3 sets - 8-10 reps - Chest Press with Anchored Resistance  - 3-4 x weekly - 1-2 sets - 8 reps - Reverse Chop with Pelvic Floor Contraction  - 3-4 x weekly - 1-2 sets - 8 reps - Standing Hip Hinge  - 3-4 x weekly - 2 sets - 10 reps - Standing Row with Resistance with Anchored Resistance at Chest Height Palms Down  - 3-4 x weekly - 2 sets - 10 reps    ASSESSMENT:  CLINICAL IMPRESSION: Pt presents to Rx stating he had a good response to dry needling.  Pt continues to report pain and stiffness with his golf swing.  Pt is improving with core strength as evidenced by progression of exercises.  Pt is advancing with exercises as evidenced by performance of cable exercises for postural and core strengthening.  Pt reports popping in R shoulder with standing resisted hz abd.  PT had pt perform in supine and pt reports improved sx's in R shoulder.   Pt performed cupping today and educated pt concerning  cupping.  Pt had an appropriate response to cupping with multiple petichiae especially in thoracic paraspinals.  PT instructed pt to drink plenty of water.  Pt responded  well to Rx having no c/o's after Rx and states he feels invigorated.     OBJECTIVE IMPAIRMENTS: decreased activity tolerance, decreased endurance, decreased mobility, decreased ROM, decreased strength, hypomobility, increased fascial restrictions, impaired UE functional use, and pain.   ACTIVITY LIMITATIONS: lifting, sleeping, bed mobility, and reaching laterally  PARTICIPATION LIMITATIONS:  golf  PERSONAL FACTORS: Time since onset of injury/illness/exacerbation and 3+ comorbidities: L THR, OA, CVA, pulmonary sarcoidosis and vestibular issues on L side   are also affecting patient's functional outcome.   REHAB POTENTIAL: Good  CLINICAL DECISION MAKING: Evolving/moderate complexity  EVALUATION COMPLEXITY: Moderate   GOALS:  SHORT TERM GOALS: Target date: 01/06/2024   Pt will be independent and compliant with HEP for improved pain, ROM, strength, and function.  Baseline: 01/03/24: reports daily HEP adherence Goal status: MET  2.  Pt will demo lumbar SB'ing AROM to be [facility] bilat for improved stiffness and mobility Baseline:  01/03/24: see ROM chart above Goal status: MET  3.  Pt will report at least a 25% improvement in pain and sx's overall.   Baseline:  01/03/24: 70% Goal status: MET    LONG TERM GOALS: Target date: 02/14/2024  Pt will be able to sleep at least 5/7 nights/wk without pain disturbance.  Baseline:  Goal status: ONGOING  2.  Pt will report at least a 70% improvement overall in pain and sx's.   Baseline:  Goal status: PARTIALLY MET   4/4  3.  Pt will demo improved core strength by progressing with core exercises without adverse effects for improved core stability with functional mobility, daily activities, and golf.  Baseline:  Goal status: PROGRESSING  4.  Pt will be able to reach overhead  and laterally without significant shoulder pain.  Baseline:  Goal status: GOAL MET  4/4  5.  Pt will report at least a 70% reduction in pain with R rotation for improved pain with playing golf.  Baseline:  Goal status: PARTIALLY MET  4/4    PLAN:  PT FREQUENCY: 1-2x/wk  PT DURATION: 3 weeks  PLANNED INTERVENTIONS: 97164- PT Re-evaluation, 97110-Therapeutic exercises, 97530- Therapeutic activity, 97112- Neuromuscular re-education, 97535- Self Care, 69629- Manual therapy, [phone]- Gait training, [phone]- Aquatic Therapy, [phone]- Ultrasound, Patient/Family education, Balance training, Stair training, Taping, Dry Needling, Joint mobilization, Spinal mobilization, Cryotherapy, and Moist heat.  PLAN FOR NEXT SESSION:  Cont with dry needling, cupping.  Cont with with periscapular and core strengthening, challenging rotation and antirotation stability within pt tolerance to work towards his goal of improved comfort w/ golf.    Audie Clear III PT, DPT 01/31/24 4:16 PM

## 2024-02-03 ENCOUNTER — Other Ambulatory Visit: Payer: Self-pay | Admitting: Family Medicine

## 2024-02-04 ENCOUNTER — Ambulatory Visit (HOSPITAL_BASED_OUTPATIENT_CLINIC_OR_DEPARTMENT_OTHER): Payer: Self-pay | Admitting: Physical Therapy

## 2024-02-04 DIAGNOSIS — M6281 Muscle weakness (generalized): Secondary | ICD-10-CM

## 2024-02-04 DIAGNOSIS — M5459 Other low back pain: Secondary | ICD-10-CM

## 2024-02-04 DIAGNOSIS — M546 Pain in thoracic spine: Secondary | ICD-10-CM

## 2024-02-04 DIAGNOSIS — M25511 Pain in right shoulder: Secondary | ICD-10-CM

## 2024-02-04 NOTE — Therapy (Unsigned)
 OUTPATIENT PHYSICAL THERAPY TREATMENT   Patient Name: Jason Weaver MRN: [id number] DOB:09-09-1956, 68 y.o., male Today's Date: 02/04/2024  END OF SESSION:  PT End of Session - 02/04/24 1306     Visit Number 14    Number of Visits 15    Date for PT Re-Evaluation 02/14/24    Authorization Type HTA    PT Start Time 1306   late sign in   PT Stop Time 1345    PT Time Calculation (min) 39 min    Activity Tolerance Patient tolerated treatment well    Behavior During Therapy [facility] for tasks assessed/performed               Past Medical History:  Diagnosis Date   Cardiomyopathy, nonischemic (HCC)    Diverticulosis of colon    ED (erectile dysfunction)    Gout    Hearing loss    Hx of colonic polyps    Hypercholesteremia 12/05/2018   Hyperlipidemia    Nasal septal deviation    Obesity    OSA (obstructive sleep apnea)    PAF (paroxysmal atrial fibrillation) (HCC)    Pulmonary sarcoidosis (HCC)  1997    biopsy proven   Past Surgical History:  Procedure Laterality Date   CATARACT EXTRACTION     HERNIA REPAIR  2009   LUNG BIOPSY  1997   TEE WITHOUT CARDIOVERSION N/A 11/25/2018   Procedure: TRANSESOPHAGEAL ECHOCARDIOGRAM (TEE);  Surgeon: Cody Das, MD;  Location: [facility] ENDOSCOPY;  Service: Cardiovascular;  Laterality: N/A;   TONSILLECTOMY  1979   Patient Active Problem List   Diagnosis Date Noted   Chronic low back pain 03/24/2021   Low testosterone 03/24/2021   Meniere's disease 03/23/2020   Acquired nasal septal defect 10/06/2019   CVA (cerebrovascular accident) (HCC) 11/14/2018   Numbness and tingling of foot 10/20/2018   Insomnia 10/20/2018   Erectile dysfunction 10/20/2018   Anal fissure 10/20/2018   Cold sore 10/20/2018   Osteoarthritis 04/20/2014   Allergic rhinitis 05/29/2012   OSA (obstructive sleep apnea) 06/04/2011   Pulmonary sarcoidosis (HCC) 01/11/2011   Gout 11/11/2009   Diverticulosis of large intestine 11/11/2009   History of colonic  polyps 11/11/2009   Dyslipidemia with statin intolerance 10/24/2009   Deafness, left 08/23/2009     REFERRING PROVIDER: Ulysees Gander, DO  REFERRING DIAG: M54.50,G89.29 (ICD-10-CM) - Chronic right-sided low back pain without sciatica M51.35 (ICD-10-CM) - DDD (degenerative disc disease), thoracolumbar M54.6,G89.29 (ICD-10-CM) - Chronic right-sided thoracic back pain M25.511,G89.29,M25.512 (ICD-10-CM) - Chronic pain of both shoulders  Rationale for Evaluation and Treatment: Rehabilitation  THERAPY DIAG:  Other low back pain  Pain in thoracic spine  Bilateral shoulder pain, unspecified chronicity  Muscle weakness (generalized)  ONSET DATE: 09/23/2023  PT order  SUBJECTIVE:  SUBJECTIVE STATEMENT: Pt notes that low back pain has been controlled since the needling. Went and played golf so he did feel the thoracic spine re tighten up. Cupping had also helped some from last session. Pt reports he will be going on vacation on Sunday.     PERTINENT HISTORY:  Chronic back pain R THR with posterolateral approach in 09/2022 OA CVA in 2020, left ear deafness, vestibular issues on L side  Pulmonary sarcoidosis    PAIN:  NPRS:  0/10 current, 6/10 worst, 1-2/10 best Location:  Lumbar  NPRS:  3-4/10 with rotation, 6-7/10 worst, 2/10 best Location:  R sided thoracic  NPRS:  0/10 current, 7/10 worst, 0/10 best Location: R / L  Pt is R handed dominant  PRECAUTIONS: Other: R THR with posterolateral approach 12/23, CVA, vestibular issues on L side, pulmonary sarcoidosis   WEIGHT BEARING RESTRICTIONS: No  FALLS:  Has patient fallen in last 6 months? No  LIVING ENVIRONMENT: Lives with: lives with their spouse Lives in: 1 story home Stairs: 2 steps to enter without rail.  Pt has a ramp  also.   OCCUPATION: Pt is retired.  PLOF: Independent.  Pt typically plays a couple of rounds a golf per week.  Pt works out it in [employer] 3 days per week.   PATIENT GOALS: to not have pain with activities, to play golf without pain and stiffness   OBJECTIVE:  Note: Objective measures were completed at Evaluation unless otherwise noted.  DIAGNOSTIC FINDINGS:  X rays on 09/23/2023: FINDINGS: There is no evidence of lumbar spine fracture. Alignment is normal. Degenerative changes at each lumbar level with marginal osteophytes and disc space narrowing. Findings most severe at L5-S1.   IMPRESSION: Degenerative changes. No acute osseous abnormalities.  PA note indicated No acute fx or dislocation. decreased anterior height of T11 vertebral body with a hx of fall 2 months ago.  multiple areas of anterior vertebral spurring t/o thoracolumbar spine.    PALPATION: Pt has moderate + tightness in lower thoracic and upper lumbar paraspinals and moderate tightness in mid to lower lumbar paraspinals.  Pt had no tenderness with palpatoin  LUMBAR / Thoracic ROM:   AROM Lumbar Thoracic 2/27 01/03/24 lumbar  Lumbar 4/4 Thoracic 4/4  Flexion WFL WFL     Extension WFL 50%   WFL  Right lateral flexion 90% WFL Just above knee joint 90%   Left lateral flexion 60% WFL Just above knee joint 90%   Right rotation WFL 75%   WFL  Left rotation WFL 75%   WFL   (Blank rows = not tested) Pt reports some tightness with lumbar ROM though no pain.   UPPER EXTREMITY ROM:     Active  Right eval Left eval Right 2/27 Left 2/27 R/L 01/03/24  Shoulder flexion 157 174   164 / 172  Shoulder scaption 163 175     Shoulder abduction 148 168     Shoulder ER   81 71   Shoulder IR   67 79                                                     (Blank rows = not tested)  MMT:    MMT Right eval Left eval Right 2/27 Left 2/27 Right 4/4  Hip flexion 5/5 5/5  Hip extension       Hip abduction        Shoulder flexion 5/5 5/5     Shoulder abduction 5/5 5/5     Shoulder scaption 5/5 5/5     Knee flexion       Knee extension 5/5 5/5     Ankle dorsiflexion       Ankle plantarflexion       Shoulder ER   4+/5 with pain 5/5 with slight pain 5/5 with pain  Shoulder IR   5/5 5/5    (Blank rows = not tested)      TREATMENT DATE:                                                                                                                               02/04/24 Qped cat/cow x10 Qped thread the needle into thoracic rotation x10 Qped child's pose 2x30" with lateral flexion x30"  Trigger Point Dry Needling  Subsequent Treatment: Instructions provided previously at initial dry needling treatment.  Instructions reviewed, if requested by the patient, prior to subsequent dry needling treatment.   Patient Verbal Consent Given: Yes Education Handout Provided: Previously Provided Muscles Treated: R mid to lower thoracic paraspinals Electrical Stimulation Performed: Yes, Parameters: 2 milliamps x 8 mins, intensity to pt tolerance Treatment Response/Outcome: Decreased muscle tension  Seated thoracic extension x10 Seated thoracic ext + rotation x10 Manual therapy: Grade II to III thoracic CPAs and UPAs, side glides Self care: Self massage with foam roller, self mobs with foam roller    01/31/24 Therapeutic Exercise:  Qped cat/camel 2x10 Cable Row 15# 2x10 Qped birddogs 2x10   Neuro Re-ed Activities: Standing diagonal chops 10# cables 2x10 high to low  Paloff press 10# cables 2x10 bilat Standing and supine Hz abd with RTB x 10 each   Manual Therapy: Pt received static cupping to bilat thoracic paraspinals and lumbar paraspinals in prone to improve soft tissue tightness and mobility and pain and to reduce myofascial adhesions and restrictions.      01/28/24 Seated pball flexion 5x5" Seated pball lateral flexion 5x5" Manual therapy  Skilled assessment and palpation for  TPDN  Trigger Point Dry Needling  Initial Treatment: Pt instructed on Dry Needling rational, procedures, and possible side effects. Pt instructed to expect mild to moderate muscle soreness later in the day and/or into the next day.  Pt instructed in methods to reduce muscle soreness. Pt instructed to continue prescribed HEP. Patient was educated on signs and symptoms of infection and other risk factors and advised to seek medical attention should they occur.  Patient verbalized understanding of these instructions and education.   Patient Verbal Consent Given: Yes Education Handout Provided: Yes Muscles Treated: R glute max/med, R thoracic paraspinals Electrical Stimulation Performed: No Treatment Response/Outcome: twitch response, decreased muscle tension  Standing diagonal chops 10# cables 2x10 high to low and low to high Standing lawnmower 10# cables 2x10  Side step  anti rotation 10# cables x10 R&L Row using bar 15# cables reactive iso x10  01/24/2024 Reviewed current function, pain levels, and response to prior Rx.   Assessed shoulder ER strength and lumbar/thoracic ROM.  See above  Modified Oswestry:  Initial/Current:  12% / 22%.  Pt states he is limited in certain activities due to L knee and ankle.  Supine alt UE/LE x 10 Q ped birddogs 2x10 Green band paloff + OH press 2x10 BIL 10# suitcase carry up/down hallway for 1 rep with cues for posture      [facility] Adult PT Treatment:                                                DATE: 01/21/24  Neuromuscular re-ed: Marrie Sizer band paloff x12 BIL Green band paloff + OH press x10 BIL  Reverse chop green band x10 BIL Green band row w/ shoulder abd 90 deg 2x12 cues for appropriate form HEP update + education  Therapeutic Activity: Green band bent lawnmower row x8, x10 BIL  BW hinge 2x10 cues for trunk/hip mechanics  10# suitcase carry 4x44ft BIL cues for posture/pacing    01/17/24 Manual Therapy:  Pt received STM to bilat  lower  thoracic and lumbar paraspinals in prone   Therapeutic Exercise: Quadruped CT rotation 2x10  Bird dog 2x10 BIL Standing horizontal abduction with RTB 2x10   Neuromuscular re-ed: Green band paloff press 2x10 bilat with TrA Supine marching with TrA x 10 (w/n hip precautions) Supine alt UE/LE with TrA 2x10 (w/n hip precautions) Rows with BTB single arm in staggered stance 2x10 bilat    01/13/24 Therapeutic Exercise: Cat/camel 2x10 Quadruped CT rotation 2x10  Bird dog 2x10 BIL   Neuromuscular re-ed: Green band paloff press 2x10 bilat with TrA Reverse chop with GTB x10 bilat Rows with BTB single arm in staggered stance 2x10 bilat  Manual Therapy:  Pt received STM to bilat  lower thoracic and lumbar paraspinals in prone      PATIENT EDUCATION:  Education details:  mechanism and rationale of cupping.  POC, rationale for interventions, HEP, and exercise form. Person educated: Patient Education method: Explanation, Demonstration, Tactile cues, Verbal cues Education comprehension: verbalized understanding, returned demonstration, verbal cues required, tactile cues required, and needs further education     HOME EXERCISE PROGRAM: Access Code: [email or web address] URL: [email or web address] Date: 01/21/2024 Prepared by: Mayme Spearman  Program Notes - with reverse chop, please disregard pelvic floor portion of comments, and perform as done in clinic  Exercises - Cat Cow  - 1 x daily - 7 x weekly - 2-3 sets - 6-8 reps - Quadruped Thoracic Rotation Full Range with Hand on Neck  - 1 x daily - 7 x weekly - 2-3 sets - 6-8 reps - Bird Dog  - 5 x weekly - 2 sets - 8 reps - Standing Anti-Rotation Press with Anchored Resistance  - 5 x weekly - 1-2 sets - 8 reps - Split Stance Shoulder Row with Resistance  - 3-4 x weekly - 2-3 sets - 8-10 reps - Chest Press with Anchored Resistance  - 3-4 x weekly - 1-2 sets - 8 reps - Reverse Chop with Pelvic Floor Contraction  - 3-4 x weekly - 1-2  sets - 8 reps - Standing Hip Hinge  - 3-4 x weekly - 2 sets - 10 reps - Standing Row with  Resistance with Anchored Resistance at Chest Height Palms Down  - 3-4 x weekly - 2 sets - 10 reps    ASSESSMENT:  CLINICAL IMPRESSION: Last scheduled visit before pt leaves on vacation. At this time, pt is requesting another 1-2 visits after his vacation as he has been having a good response to needling and cupping. Session focused on continuing to improve thoracic mobility and decease muscle tension. Added thoracic mobilizations today and demonstrated to pt how to perform self mobs at home with foam roller.    OBJECTIVE IMPAIRMENTS: decreased activity tolerance, decreased endurance, decreased mobility, decreased ROM, decreased strength, hypomobility, increased fascial restrictions, impaired UE functional use, and pain.   ACTIVITY LIMITATIONS: lifting, sleeping, bed mobility, and reaching laterally  PARTICIPATION LIMITATIONS:  golf  PERSONAL FACTORS: Time since onset of injury/illness/exacerbation and 3+ comorbidities: L THR, OA, CVA, pulmonary sarcoidosis and vestibular issues on L side   are also affecting patient's functional outcome.   REHAB POTENTIAL: Good  CLINICAL DECISION MAKING: Evolving/moderate complexity  EVALUATION COMPLEXITY: Moderate   GOALS:  SHORT TERM GOALS: Target date: 01/06/2024   Pt will be independent and compliant with HEP for improved pain, ROM, strength, and function.  Baseline: 01/03/24: reports daily HEP adherence Goal status: MET  2.  Pt will demo lumbar SB'ing AROM to be [facility] bilat for improved stiffness and mobility Baseline:  01/03/24: see ROM chart above Goal status: MET  3.  Pt will report at least a 25% improvement in pain and sx's overall.   Baseline:  01/03/24: 70% Goal status: MET    LONG TERM GOALS: Target date: 02/14/2024  Pt will be able to sleep at least 5/7 nights/wk without pain disturbance.  Baseline:  02/04/24: Hip is disturbing sleep  not back Goal status: PARTIALLY MET  2.  Pt will report at least a 70% improvement overall in pain and sx's.   Baseline:  Goal status: PARTIALLY MET   4/4  3.  Pt will demo improved core strength by progressing with core exercises without adverse effects for improved core stability with functional mobility, daily activities, and golf.  Baseline:  Goal status: PROGRESSING  4.  Pt will be able to reach overhead and laterally without significant shoulder pain.  Baseline:  Goal status: GOAL MET  4/4  5.  Pt will report at least a 70% reduction in pain with R rotation for improved pain with playing golf.  Baseline:  02/04/24: ~75% reduction Goal status: PARTIALLY MET  4/4    PLAN:  PT FREQUENCY: 1-2x/wk  PT DURATION: 3 weeks  PLANNED INTERVENTIONS: 97164- PT Re-evaluation, 97110-Therapeutic exercises, 97530- Therapeutic activity, 97112- Neuromuscular re-education, 97535- Self Care, 16109- Manual therapy, [phone]- Gait training, [phone]- Aquatic Therapy, [phone]- Ultrasound, Patient/Family education, Balance training, Stair training, Taping, Dry Needling, Joint mobilization, Spinal mobilization, Cryotherapy, and Moist heat.  PLAN FOR NEXT SESSION:  Re-cert? Cont with dry needling, cupping.  Cont with with periscapular and core strengthening, challenging rotation and antirotation stability within pt tolerance to work towards his goal of improved comfort w/ golf.    Shelsey Rieth April Ma L Genevra Orne, PT, DPT 02/04/24 1:09 PM

## 2024-02-24 ENCOUNTER — Ambulatory Visit (HOSPITAL_BASED_OUTPATIENT_CLINIC_OR_DEPARTMENT_OTHER): Attending: Sports Medicine | Admitting: Physical Therapy

## 2024-02-24 DIAGNOSIS — M546 Pain in thoracic spine: Secondary | ICD-10-CM | POA: Insufficient documentation

## 2024-02-24 DIAGNOSIS — M5459 Other low back pain: Secondary | ICD-10-CM | POA: Insufficient documentation

## 2024-02-24 DIAGNOSIS — M6281 Muscle weakness (generalized): Secondary | ICD-10-CM | POA: Insufficient documentation

## 2024-02-24 DIAGNOSIS — M25512 Pain in left shoulder: Secondary | ICD-10-CM | POA: Insufficient documentation

## 2024-02-24 DIAGNOSIS — M25511 Pain in right shoulder: Secondary | ICD-10-CM | POA: Insufficient documentation

## 2024-05-11 ENCOUNTER — Ambulatory Visit (INDEPENDENT_AMBULATORY_CARE_PROVIDER_SITE_OTHER)

## 2024-05-11 VITALS — BP 112/68 | HR 76 | Temp 97.7°F | Ht 72.0 in | Wt 226.4 lb

## 2024-05-11 DIAGNOSIS — Z0000 Encounter for general adult medical examination without abnormal findings: Secondary | ICD-10-CM

## 2024-05-11 NOTE — Patient Instructions (Signed)
 Mr. Staffa , Thank you for taking time out of your busy schedule to complete your Annual Wellness Visit with me. I enjoyed our conversation and look forward to speaking with you again next year. I, as well as your care team,  appreciate your ongoing commitment to your health goals. Please review the following plan we discussed and let me know if I can assist you in the future. Your Game plan/ To Do List    Referrals: If you haven't heard from the office you've been referred to, please reach out to them at the phone provided.   Follow up Visits: Next Medicare AWV with our clinical staff: 05/17/25   Have you seen your provider in the last 6 months (3 months if uncontrolled diabetes)? Yes Next Office Visit with your provider: 12/29/24  Clinician Recommendations:  Aim for 30 minutes of exercise or brisk walking, 6-8 glasses of water, and 5 servings of fruits and vegetables each day.       This is a list of the screening recommended for you and due dates:  Health Maintenance  Topic Date Due   Medicare Annual Wellness Visit  Never done   Zoster (Shingles) Vaccine (1 of 2) Never done   COVID-19 Vaccine (1 - 2024-25 season) Never done   DTaP/Tdap/Td vaccine (2 - Td or Tdap) 09/16/2024*   Pneumococcal Vaccine for age over 36 (1 of 1 - PCV) 09/16/2024*   Flu Shot  05/22/2024   Colon Cancer Screening  08/13/2026   Hepatitis C Screening  Completed   Hepatitis B Vaccine  Aged Out   HPV Vaccine  Aged Out   Meningitis B Vaccine  Aged Out  *Topic was postponed. The date shown is not the original due date.    Advanced directives: (Provided) Advance directive discussed with you today. I have provided a copy for you to complete at home and have notarized. Once this is complete, please bring a copy in to our office so we can scan it into your chart.  Advance Care Planning is important because it:  [x]  Makes sure you receive the medical care that is consistent with your values, goals, and  preferences  [x]  It provides guidance to your family and loved ones and reduces their decisional burden about whether or not they are making the right decisions based on your wishes.  Follow the link provided in your after visit summary or read over the paperwork we have mailed to you to help you started getting your Advance Directives in place. If you need assistance in completing these, please reach out to us  so that we can help you!  See attachments for Preventive Care and Fall Prevention Tips.

## 2024-05-11 NOTE — Progress Notes (Signed)
 Subjective:   Jason Weaver is a 68 y.o. who presents for a Medicare Wellness preventive visit.  As a reminder, Annual Wellness Visits don't include a physical exam, and some assessments may be limited, especially if this visit is performed virtually. We may recommend an in-person follow-up visit with your provider if needed.  Visit Complete: In person    Persons Participating in Visit: Patient.  AWV Questionnaire: Yes: Patient Medicare AWV questionnaire was completed by the patient on 05/11/24; I have confirmed that all information answered by patient is correct and no changes since this date.  Cardiac Risk Factors include: advanced age (>39men, >9 women);dyslipidemia;male gender     Objective:    Today's Vitals   05/11/24 0915  BP: 112/68  Pulse: 76  Temp: 97.7 F (36.5 C)  SpO2: 96%  Weight: 226 lb 6.4 oz (102.7 kg)  Height: 6' (1.829 m)   Body mass index is 30.71 kg/m.     05/11/2024    9:21 AM 12/16/2023    2:18 PM 01/27/2019   12:58 PM 11/25/2018    8:02 AM 11/14/2018    8:00 PM 11/14/2018    4:52 PM 11/09/2018    5:02 AM  Advanced Directives  Does Patient Have a Medical Advance Directive? No No No No  No  No  No   Would patient like information on creating a medical advance directive? No - Patient declined No - Patient declined Yes (MAU/Ambulatory/Procedural Areas - Information given)  No - Patient declined  No - Patient declined  No - Patient declined  No - Patient declined      Data saved with a previous flowsheet row definition    Current Medications (verified) Outpatient Encounter Medications as of 05/11/2024  Medication Sig   albuterol  (PROAIR  HFA) 108 (90 Base) MCG/ACT inhaler INHALE 2 PUFFS INTO THE LUNGS EVERY 6 (SIX) HOURS AS NEEDED FOR WHEEZING.   Ascorbic Acid (VITAMIN C) 1000 MG tablet Take 1,000 mg by mouth daily.   Cyanocobalamin  (VITAMIN B 12 PO) Take 1 tablet by mouth daily.   diphenhydrAMINE  (BENADRYL ) 25 MG tablet Take 25 mg by mouth every 6  (six) hours as needed for allergies.   fluticasone  (FLONASE ) 50 MCG/ACT nasal spray Place 1 spray into both nostrils as needed.   hydrocortisone  (ANUSOL -HC) 2.5 % rectal cream Place 1 application rectally 2 (two) times daily as needed for hemorrhoids (itching).   ibuprofen (ADVIL) 200 MG tablet Take 200 mg by mouth as needed.   sildenafil  (VIAGRA ) 100 MG tablet TAKE 1/2 TO 1 TABLET BY MOUTH DAILY AS NEEDED FOR ERECTILE DYSFUNCTION   valACYclovir (VALTREX) 1000 MG tablet Take 1 g by mouth as directed. Take 1 tablet daily for 3-4 days as needed for flare ups   vitamin E 100 UNIT capsule Take 100 Units by mouth daily.   zolpidem  (AMBIEN ) 10 MG tablet TAKE 1/2 TO 1 TABLET BY MOUTH EVERY NIGHT AT BEDTIME AS NEEDED FOR SLEEP   No facility-administered encounter medications on file as of 05/11/2024.    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   Past Surgical History:  Procedure Laterality Date   CATARACT EXTRACTION     HERNIA REPAIR  2009   LUNG BIOPSY  1997   TEE WITHOUT CARDIOVERSION N/A 11/25/2018   Procedure: TRANSESOPHAGEAL ECHOCARDIOGRAM (TEE);  Surgeon: Elmira Newman PARAS, MD;  Location: [facility] ENDOSCOPY;  Service: Cardiovascular;  Laterality: N/A;   TONSILLECTOMY  1979   Family History  Problem Relation Age of Onset   COPD Father        was a smoker   Arthritis Father    Asthma Sister    Cancer Sister    Alcohol abuse Brother    Social History   Socioeconomic History   Marital status: Married    Spouse name: Not on file   Number of children: 0   Years of  education: 16   Highest education level: Bachelor's degree (e.g., BA, AB, BS)  Occupational History   Occupation: Teaching laboratory technician: [employer]  Tobacco Use   Smoking status: Passive Smoke Exposure - Never Smoker   Smokeless tobacco: Never  Vaping Use   Vaping status: Never Used  Substance and Sexual Activity   Alcohol use: Yes    Alcohol/week: 2.0 standard drinks of alcohol    Types: 2 Glasses of wine per week   Drug use: Never   Sexual activity: Yes  Other Topics Concern   Not on file  Social History Narrative   Lives with wife in a one story home.  No children.  Works for [employer].  Education: college.    Social Drivers of Corporate investment banker Strain: Low Risk  (05/11/2024)   Overall Financial Resource Strain (CARDIA)    Difficulty of Paying Living Expenses: Not hard at all  Food Insecurity: No Food Insecurity (05/11/2024)   Hunger Vital Sign    Worried About Running Out of Food in the Last Year: Never true    Ran Out of Food in the Last Year: Never true  Transportation Needs: No Transportation Needs (05/11/2024)   PRAPARE - Administrator, Civil Service (Medical): No    Lack of Transportation (Non-Medical): No  Physical Activity: Sufficiently Active (05/11/2024)   Exercise Vital Sign    Days of Exercise per Week: 4 days    Minutes of Exercise per Session: 60 min  Stress: No Stress Concern Present (05/11/2024)   [employer] of Occupational Health - Occupational Stress Questionnaire    Feeling of Stress: Not at all  Social Connections: Moderately Integrated (05/11/2024)   Social Connection and Isolation Panel    Frequency of Communication with Friends and Family: Twice a week    Frequency of Social Gatherings with Friends and Family: Twice a week    Attends Religious Services: Never    Database administrator or Organizations: Yes    Attends Engineer, structural: More than 4 times per year    Marital Status: Married    Tobacco  Counseling Counseling given: Not Answered    Clinical Intake:  Pre-visit preparation completed: Yes  Pain : No/denies pain     BMI - recorded: 30.71 Nutritional Status: BMI > 30  Obese Nutritional Risks: None Diabetes: No  Lab Results  Component Value Date   HGBA1C 5.3 05/18/2022   HGBA1C 5.0 03/27/2021   HGBA1C 5.4 03/23/2020     How often do you need to have someone help you when you  read instructions, pamphlets, or other written materials from your doctor or pharmacy?: 1 - Never  Interpreter Needed?: No  Information entered by :: Ellouise Haws, LPN   Activities of Daily Living     05/11/2024    8:06 AM  In your present state of health, do you have any difficulty performing the following activities:  Hearing? 1  Comment right ear hearing aid left ear loss of hearing  Difficulty concentrating or making decisions? 0  Walking or climbing stairs? 0  Dressing or bathing? 0  Doing errands, shopping? 0  Preparing Food and eating ? N  Using the Toilet? N  In the past six months, have you accidently leaked urine? N  Do you have problems with loss of bowel control? N  Managing your Medications? N  Managing your Finances? N  Housekeeping or managing your Housekeeping? N    Patient Care Team: Kennyth Worth HERO, MD as PCP - General (Family Medicine) Patel, Donika K, DO as Consulting Physician (Neurology)  I have updated your Care Teams any recent Medical Services you may have received from other providers in the past year.     Assessment:   This is a routine wellness examination for [place].  Hearing/Vision screen Hearing Screening - Comments:: Right ear hearing aid left eaR LOSS HEARING  Vision Screening - Comments:: Wears rx glasses - up to date with routine eye exams with [facility] company on [address]    Goals Addressed             This Visit's Progress    Patient Stated       Lose 20 lbs        Depression Screen     05/11/2024    9:23 AM 12/26/2023     2:54 PM 09/17/2023   11:50 AM 07/19/2022    7:53 AM 03/24/2021    8:35 AM 10/20/2018    9:33 AM 01/14/2017   11:07 AM  PHQ 2/9 Scores  PHQ - 2 Score 0 0 0 0 0 0 0  PHQ- 9 Score     0      Fall Risk     05/11/2024    8:06 AM 12/26/2023    2:54 PM 09/17/2023   11:50 AM 07/19/2022    7:53 AM 01/27/2019   12:57 PM  Fall Risk   Falls in the past year? 0 0 0 0 0   Number falls in past yr: 0 0 0 0 0   Injury with Fall? 0 0 0 0 0  Risk for fall due to : Impaired balance/gait No Fall Risks No Fall Risks No Fall Risks   Follow up Falls prevention discussed    Falls evaluation completed      Data saved with a previous flowsheet row definition    MEDICARE RISK AT HOME:  Medicare Risk at Home Any stairs in or around the home?: (Patient-Rptd) Yes If so, are there any without handrails?: Yes Home free of loose throw rugs in walkways, pet beds, electrical cords, etc?: Yes Adequate lighting in your home to reduce risk of falls?: Yes Life alert?: No Use of a cane, walker or w/c?: (Patient-Rptd) Yes Grab bars in the bathroom?: (Patient-Rptd) No Shower chair or bench in shower?: (Patient-Rptd) No Elevated toilet seat or a handicapped toilet?: (Patient-Rptd) No  TIMED UP AND GO:  Was the test performed?  Yes  Length of time to ambulate 10 feet: 10 sec Gait steady and fast without use of assistive device  Cognitive Function: 6CIT completed        05/11/2024    9:27 AM  6CIT Screen  What Year? 0 points  What month? 0 points  What time? 0 points  Count back from 20 0 points  Months in reverse 0 points  Repeat phrase 0 points  Total Score 0 points    Immunizations Immunization History  Administered Date(s) Administered   Hep A, Unspecified 06/18/2003   Hepatitis A, Adult 06/09/2007   Tdap 06/09/2007   Typhoid Inactivated 06/18/2003   Typhoid Live 06/09/2007    Screening Tests Health Maintenance  Topic Date Due   Zoster Vaccines- Shingrix (1 of 2) Never done   COVID-19  Vaccine (1 - 2024-25 season) Never done   DTaP/Tdap/Td (2 - Td or Tdap) 09/16/2024 (Originally 06/08/2017)   Pneumococcal Vaccine: 50+ Years (1 of 1 - PCV) 09/16/2024 (Originally 02/14/2008)   INFLUENZA VACCINE  05/22/2024   Medicare Annual Wellness (AWV)  05/11/2025   Colonoscopy  08/13/2026   Hepatitis C Screening  Completed   Hepatitis B Vaccines  Aged Out   HPV VACCINES  Aged Out   Meningococcal B Vaccine  Aged Out    Health Maintenance  Health Maintenance Due  Topic Date Due   Zoster Vaccines- Shingrix (1 of 2) Never done   COVID-19 Vaccine (1 - 2024-25 season) Never done   Health Maintenance Items Addressed: See Nurse Notes at the end of this note  Additional Screening:  Vision Screening: Recommended annual ophthalmology exams for early detection of glaucoma and other disorders of the eye. Would you like a referral to an eye doctor? No    Dental Screening: Recommended annual dental exams for proper oral hygiene  Community Resource Referral / Chronic Care Management: CRR required this visit?  No   CCM required this visit?  No   Plan:    I have personally reviewed and noted the following in the patient's chart:   Medical and social history Use of alcohol, tobacco or illicit drugs  Current medications and supplements including opioid prescriptions. Patient is not currently taking opioid prescriptions. Functional ability and status Nutritional status Physical activity Advanced directives List of other physicians Hospitalizations, surgeries, and ER visits in previous 12 months Vitals Screenings to include cognitive, depression, and falls Referrals and appointments  In addition, I have reviewed and discussed with patient certain preventive protocols, quality metrics, and best practice recommendations. A written personalized care plan for preventive services as well as general preventive health recommendations were provided to patient.   Ellouise VEAR Haws,  LPN   2/78/7974   After Visit Summary: (MyChart) Due to this being a telephonic visit, the after visit summary with patients personalized plan was offered to patient via MyChart   Notes: PCP Follow Up Recommendations: Pt stated he doesn't use cpap,

## 2024-05-26 DIAGNOSIS — L218 Other seborrheic dermatitis: Secondary | ICD-10-CM | POA: Diagnosis not present

## 2024-05-26 DIAGNOSIS — Z85828 Personal history of other malignant neoplasm of skin: Secondary | ICD-10-CM | POA: Diagnosis not present

## 2024-05-26 DIAGNOSIS — L718 Other rosacea: Secondary | ICD-10-CM | POA: Diagnosis not present

## 2024-05-26 DIAGNOSIS — Z85820 Personal history of malignant melanoma of skin: Secondary | ICD-10-CM | POA: Diagnosis not present

## 2024-05-26 DIAGNOSIS — L905 Scar conditions and fibrosis of skin: Secondary | ICD-10-CM | POA: Diagnosis not present

## 2024-05-26 DIAGNOSIS — L820 Inflamed seborrheic keratosis: Secondary | ICD-10-CM | POA: Diagnosis not present

## 2024-05-26 DIAGNOSIS — L821 Other seborrheic keratosis: Secondary | ICD-10-CM | POA: Diagnosis not present

## 2024-05-26 DIAGNOSIS — D224 Melanocytic nevi of scalp and neck: Secondary | ICD-10-CM | POA: Diagnosis not present

## 2024-07-31 ENCOUNTER — Ambulatory Visit: Payer: Self-pay

## 2024-07-31 ENCOUNTER — Encounter: Payer: Self-pay | Admitting: Internal Medicine

## 2024-07-31 ENCOUNTER — Ambulatory Visit (INDEPENDENT_AMBULATORY_CARE_PROVIDER_SITE_OTHER): Admitting: Internal Medicine

## 2024-07-31 VITALS — BP 124/78 | HR 70 | Temp 97.3°F | Ht 72.0 in | Wt 222.2 lb

## 2024-07-31 DIAGNOSIS — S83421A Sprain of lateral collateral ligament of right knee, initial encounter: Secondary | ICD-10-CM

## 2024-07-31 NOTE — Telephone Encounter (Signed)
 Scheduled for acute visit with another provider in office for today at 11:20 AM  FYI Only or Action Required?: FYI only for provider.  Patient was last seen in primary care on 12/26/2023 by Lucius Krabbe, NP.  Called Nurse Triage reporting Knee Pain.  Symptoms began a week ago.  Interventions attempted: OTC medications: Icy Hot and Ice/heat application.  Symptoms are: unchanged.  Triage Disposition: See HCP Within 4 Hours (Or PCP Triage)  Patient/caregiver understands and will follow disposition?: Yes  Copied from CRM [id number]. Topic: Clinical - Red Word Triage >> Jul 31, 2024  9:14 AM Robinson H wrote: [facility] that prompted transfer to Nurse Triage: Knee injury earlier in the week, Right knee, pain and swelling Reason for Disposition  [1] SEVERE pain (e.g., excruciating, unable to walk) AND [2] not improved after 2 hours of pain medicine  Answer Assessment - Initial Assessment Questions 1. LOCATION and RADIATION: Where is the pain located?      Outside of right knee 2. QUALITY: What does the pain feel like?  (e.g., sharp, dull, aching, burning)     Sharp pain with moving a certain direction 3. SEVERITY: How bad is the pain? What does it keep you from doing?   (Scale 1-10; or mild, moderate, severe)     No pain with sitting-with movement 8 to 9 out of 10 4. ONSET: When did the pain start? Does it come and go, or is it there all the time?     Started last Saturday after working in his yard 5. RECURRENT: Have you had this pain before? If Yes, ask: When, and what happened then?     no 6. SETTING: Has there been any recent work, exercise or other activity that involved that part of the body?      Yes-working in the yard 7. AGGRAVATING FACTORS: What makes the knee pain worse? (e.g., walking, climbing stairs, running)     Moving a certain way increases pain.  8. ASSOCIATED SYMPTOMS: Is there any swelling or redness of the knee?     Some puffiness 9. OTHER  SYMPTOMS: Do you have any other symptoms? (e.g., calf pain, chest pain, difficulty breathing, fever)     no  Protocols used: Knee Pain-A-AH

## 2024-07-31 NOTE — Patient Instructions (Addendum)
 LCL Sprain Therapy Guide  What is an LCL sprain?   The lateral collateral ligament (LCL) is a band of tissue on the outside of your knee that helps keep it stable. An LCL sprain means this ligament has been stretched or irritated, often from overuse or awkward movements.[1][2]  Goals of Physical Therapy:   - Reduce pain and swelling   - Restore knee movement   - Build strength and stability   - Help you return to normal activities safely[1][3]  General Tips:   - Rest your knee and avoid activities that cause pain, especially those that put sideways (varus) stress on your knee.[1][4]  - You may use ice packs (20 minutes at a time, several times a day) to help with pain and swelling.  - A soft knee sleeve or brace can provide comfort, but is not always necessary.[1][4]  - Keep your knee moving gently to prevent stiffness.  Exercises to Start (if pain allows):   Do these 1-2 times daily, but stop if pain increases.  - Heel Slides: Sit or lie down. Slowly slide your heel toward your bottom, then straighten your leg. Repeat 10-15 times.  - Quad Sets: Sit with your leg straight. Tighten your thigh muscle and press the back of your knee down. Hold for 5 seconds, relax. Repeat 10-15 times.  - Straight Leg Raises: Lie down, bend your left knee, keep your right leg straight. Lift your right leg up about 12 inches, hold for 3 seconds, lower slowly. Repeat 10-15 times.  Progressing Your Program:   - As pain improves, add gentle cycling or walking. Avoid running, jumping, or twisting until cleared by your healthcare provider.[1][4][3]  - Gradually add strengthening exercises for your thigh, hip, and calf muscles.  - Balance exercises (like standing on one leg) can help with stability.  When to Call Your Provider:   - If your knee feels unstable, locks, or gives way  - If swelling, pain, or bruising gets worse  - If you cannot bear weight or straighten your knee  Recovery  Timeline:   Most people with mild LCL sprains recover in a few weeks with proper care. More severe injuries may take longer. Returning to sports or heavy activity should be guided by your provider.[4][5][6]  Remember:   Stick to your exercise plan, avoid activities that hurt, and let your provider know if you have any concerns. This will help your knee heal and get you back to your normal routine safely.[1][4][3][2][7] References Lateral Collateral Ligament Injury About the Knee: Anatomy, Evaluation, and Management. Grawe B, Schroeder AJ, Kakazu R, Messer MS. The Journal of the American Academy of Orthopaedic Surgeons. 2018;26(6):e120-e127. doi:10.5435/JAAOS-[id number]. Editorial Commentary: Knee Lateral Collateral Ligament Injury Is More Common Than We Thought. Warren RF. Arthroscopy : The Journal of Arthroscopic & Related Surgery : Official Publication of the Arthroscopy Association of [place] and the International Arthroscopy Association. 2017;33(12):2182-2183. doi:10.1016/j.arthro.2017.09.010. Rehabilitation of the Multiple-Ligament-Injured Knee. Jomarie RENETTE Epifanio ODESSIA. Clinics in Sports Medicine. 2000;19(3):545-71. doi:10.1016/s0278-5919(05)70223-4. Lateral Ligament Injuries of the Knee. Krukhaug Y, Mlster A, Rodt A, Wardell LANES Knee Surgery, Sports Traumatology, Arthroscopy : Official Journal of the ESSKA. 1998;6(1):21-5. doi:10.1007/s001670050067. Treatment of Magnetic Resonance Imaging-Documented Isolated Grade III Lateral Collateral Ligament Injuries in [employer] Athletes. Bushnell BD, Bitting SS, Crain JM, Boublik M, Schlegel TF. The American Journal of Sports Medicine. 2010;38(1):86-91. doi:10.1177/[id number]. Collateral Ligament Knee Injuries in Pediatric and Adolescent Athletes. Tess EVERITT Pinal PE, Berrahou IK, Yen YM, Heyworth BE. Journal of Pediatric Orthopedics.  2020;40(2):71-77. doi:10.1097/BPO.0000000000001112. Elongation Patterns of the Collateral  Ligaments of the Human Knee. Harfe DT, Chuinard CR, Espinoza LM, Debby RUDE, Solomonow M. Clinical Biomechanics [place]). 1998;13(3):163-175. doi:10.1016/s0268-0033(97)00043-0.

## 2024-07-31 NOTE — Progress Notes (Signed)
 ==============================  [facility]: [phone]   -- Medical Office Visit --  Patient: Jason Weaver      Age: 68 y.o.       Sex:  male  Date:   07/31/2024 Today's Healthcare Provider: Bernardino KANDICE Cone, MD  ==============================   Chief Complaint: Knee Pain (Pt c/o right knee pain after doing yard work, last weekend. Has tried icy hot and biofreeze, which did not help sx. )   Discussed the use of AI scribe software for clinical note transcription with the patient, who gave verbal consent to proceed.  History of Present Illness Jason Weaver is a 68 year old male who presents with right knee pain following a recent injury.  He experienced right knee pain after working on a landscape bed at home, using a chainsaw and exerting force on an old tree root. The pain was first noticed the following morning, characterized by a sharp sensation when attempting to bear weight on the knee.  The pain is localized to the lateral aspect of the right knee, which the patient points to as the area of discomfort. It is described as sharp and is particularly severe when rising from a seated position or when putting weight on the knee after sitting. Walking and standing do not exacerbate the pain, and he was able to play golf without issue.  He has a history of knee injuries from playing sports and describes the current pain as different from previous experiences, noting it is more specific and sharp. Massaging the area increases the pain, and topical treatments like Icy Hot and Biofreeze provide limited relief.  No swelling in the knee is reported, and the pain is not present during weight-bearing activities like walking. He has been using Advil (ibuprofen) as needed, particularly before playing golf, which helps alleviate general aches but does not completely relieve the knee pain.  Background Reviewed: Problem List: has Dyslipidemia with statin  intolerance; Gout; Deafness, left; Diverticulosis of large intestine; History of colonic polyps; Pulmonary sarcoidosis (HCC); OSA (obstructive sleep apnea); Allergic rhinitis; Osteoarthritis; Numbness and tingling of foot; Insomnia; Erectile dysfunction; Anal fissure; Cold sore; CVA (cerebrovascular accident) (HCC); Acquired nasal septal defect; Meniere's disease; Chronic low back pain; Low testosterone ; and Tear of LCL (lateral collateral ligament) of knee, right, initial encounter on their problem list. Past Medical History:  has a past medical history of Cardiomyopathy, nonischemic (HCC), Diverticulosis of colon, ED (erectile dysfunction), Gout, Hearing loss, colonic polyps, Hypercholesteremia (12/05/2018), Hyperlipidemia, Nasal septal deviation, Obesity, OSA (obstructive sleep apnea), PAF (paroxysmal atrial fibrillation) (HCC), and Pulmonary sarcoidosis ( 1997). Past Surgical History:   has a past surgical history that includes Hernia repair (2009); Tonsillectomy (1979); Cataract extraction; Lung biopsy (1997); and TEE without cardioversion (N/A, 11/25/2018). Social History:   reports that he has never smoked. He has been exposed to tobacco smoke. He has never used smokeless tobacco. He reports current alcohol use of about 2.0 standard drinks of alcohol per week. He reports that he does not use drugs. Family History:  family history includes Alcohol abuse in his brother; Arthritis in his father; Asthma in his sister; COPD in his father; Cancer in his sister. Allergies:  is allergic to ciprofloxacin  and statins.   Medication Reconciliation: Current Outpatient Medications on File Prior to Visit  Medication Sig   albuterol  (PROAIR  HFA) 108 (90 Base) MCG/ACT inhaler INHALE 2 PUFFS INTO THE LUNGS EVERY 6 (SIX) HOURS AS NEEDED FOR WHEEZING.   Ascorbic Acid (  VITAMIN C) 1000 MG tablet Take 1,000 mg by mouth daily.   Cyanocobalamin  (VITAMIN B 12 PO) Take 1 tablet by mouth daily.   diphenhydrAMINE  (BENADRYL ) 25  MG tablet Take 25 mg by mouth every 6 (six) hours as needed for allergies.   fluticasone  (FLONASE ) 50 MCG/ACT nasal spray Place 1 spray into both nostrils as needed.   ibuprofen (ADVIL) 200 MG tablet Take 200 mg by mouth as needed.   sildenafil  (VIAGRA ) 100 MG tablet TAKE 1/2 TO 1 TABLET BY MOUTH DAILY AS NEEDED FOR ERECTILE DYSFUNCTION   valACYclovir (VALTREX) 1000 MG tablet Take 1 g by mouth as directed. Take 1 tablet daily for 3-4 days as needed for flare ups   vitamin E 100 UNIT capsule Take 100 Units by mouth daily.   zolpidem  (AMBIEN ) 10 MG tablet TAKE 1/2 TO 1 TABLET BY MOUTH EVERY NIGHT AT BEDTIME AS NEEDED FOR SLEEP   hydrocortisone  (ANUSOL -HC) 2.5 % rectal cream Place 1 application rectally 2 (two) times daily as needed for hemorrhoids (itching).   No current facility-administered medications on file prior to visit.  There are no discontinued medications.   Physical Exam:    07/31/2024   11:29 AM 05/11/2024    9:15 AM 12/26/2023    2:55 PM  Vitals with BMI  Height 6' 0 6' 0 5' 11  Weight 222 lbs 3 oz 226 lbs 6 oz 234 lbs 3 oz  BMI 30.13 30.7 32.68  Systolic 124 112 867  Diastolic 78 68 74  Pulse 70 76 67  Vital signs reviewed.  Nursing notes reviewed. Weight trend reviewed. Physical Activity: Sufficiently Active (05/11/2024)   Exercise Vital Sign    Days of Exercise per Week: 4 days    Minutes of Exercise per Session: 60 min   General Appearance:  No acute distress appreciable.   Well-groomed, healthy-appearing male.  Well proportioned with no abnormal fat distribution.  Good muscle tone. Pulmonary:  Normal work of breathing at rest, no respiratory distress apparent. SpO2: 97 %  Musculoskeletal: All extremities are intact.  Neurological:  Awake, alert, oriented, and engaged.  No obvious focal neurological deficits or cognitive impairments.  Sensorium seems unclouded.   Speech is clear and coherent with logical content. Psychiatric:  Appropriate mood, pleasant and  cooperative demeanor, thoughtful and engaged during the exam   Verbalized to patient: Physical Exam MUSCULOSKELETAL: Pain in the lateral aspect of the right knee, exacerbated by rapid leg extension from a 90-degree knee flexion. Also tender to palpate R LCL.  No pain with weight bearing, no pain with varus strain but flaring pain over LCL with valgus strain.    No image results found. No results found.       ASSESSMENT & PLAN   Assessment & Plan Tear of LCL (lateral collateral ligament) of knee, right, initial encounter Right lateral collateral ligament (LCL) strain/partial tear   Acute right LCL strain or partial tear likely resulted from overuse during chainsawing activities. He experiences sharp pain localized to the lateral knee, worsened by movements like standing from a seated position. Examination and symptoms indicate LCL involvement rather than a lateral meniscus tear. Conservative management is appropriate as the condition is not dangerous and does not require surgery. Rest, ice, and compress the knee. Apply Voltaren gel for its anti-inflammatory effects. Avoid activities that place varus stress on the knee. Provide a physical therapy handout with exercises and consider a physical therapy referral if symptoms persist. Avoid rigid immobilization; encourage gentle mobilization. Use Advil as  needed for pain. Follow up with sports medicine or orthopedics if symptoms do not improve; he defers referral for now.  Recording duration: 30 minutes  ORDER ASSOCIATIONS  #   DIAGNOSIS / CONDITION ICD-10 ENCOUNTER ORDER     ICD-10-CM   1. Tear of LCL (lateral collateral ligament) of knee, right, initial encounter  [phone] Ambulatory referral to Physical Therapy         Orders Placed in Encounter:  Referral Orders         Ambulatory referral to Physical Therapy           This document was synthesized by artificial intelligence ([employer]bridge) using HIPAA-compliant recording of the clinical  interaction;   We discussed the use of AI scribe software for clinical note transcription with the patient, who gave verbal consent to proceed. additional Info: This encounter employed state-of-the-art, real-time, collaborative documentation. The patient actively reviewed and assisted in updating their electronic medical record on a shared screen, ensuring transparency and facilitating joint problem-solving for the problem list, overview, and plan. This approach promotes accurate, informed care. The treatment plan was discussed and reviewed in detail, including medication safety, potential side effects, and all patient questions. We confirmed understanding and comfort with the plan. Follow-up instructions were established, including contacting the office for any concerns, returning if symptoms worsen, persist, or new symptoms develop, and precautions for potential emergency department visits.

## 2024-07-31 NOTE — Assessment & Plan Note (Signed)
 Right lateral collateral ligament (LCL) strain/partial tear   Acute right LCL strain or partial tear likely resulted from overuse during chainsawing activities. He experiences sharp pain localized to the lateral knee, worsened by movements like standing from a seated position. Examination and symptoms indicate LCL involvement rather than a lateral meniscus tear. Conservative management is appropriate as the condition is not dangerous and does not require surgery. Rest, ice, and compress the knee. Apply Voltaren gel for its anti-inflammatory effects. Avoid activities that place varus stress on the knee. Provide a physical therapy handout with exercises and consider a physical therapy referral if symptoms persist. Avoid rigid immobilization; encourage gentle mobilization. Use Advil as needed for pain. Follow up with sports medicine or orthopedics if symptoms do not improve; he defers referral for now.  Recording duration: 30 minutes

## 2024-07-31 NOTE — Telephone Encounter (Signed)
 Noted

## 2024-08-12 DIAGNOSIS — H4312 Vitreous hemorrhage, left eye: Secondary | ICD-10-CM | POA: Diagnosis not present

## 2024-08-12 DIAGNOSIS — H43813 Vitreous degeneration, bilateral: Secondary | ICD-10-CM | POA: Diagnosis not present

## 2024-08-12 DIAGNOSIS — H31092 Other chorioretinal scars, left eye: Secondary | ICD-10-CM | POA: Diagnosis not present

## 2024-08-12 DIAGNOSIS — H25812 Combined forms of age-related cataract, left eye: Secondary | ICD-10-CM | POA: Diagnosis not present

## 2024-08-12 DIAGNOSIS — H43391 Other vitreous opacities, right eye: Secondary | ICD-10-CM | POA: Diagnosis not present

## 2024-08-12 DIAGNOSIS — H35372 Puckering of macula, left eye: Secondary | ICD-10-CM | POA: Diagnosis not present

## 2024-09-01 ENCOUNTER — Encounter: Payer: Self-pay | Admitting: General Practice

## 2024-09-03 DIAGNOSIS — H43813 Vitreous degeneration, bilateral: Secondary | ICD-10-CM | POA: Diagnosis not present

## 2024-09-03 DIAGNOSIS — H52203 Unspecified astigmatism, bilateral: Secondary | ICD-10-CM | POA: Diagnosis not present

## 2024-09-03 DIAGNOSIS — H524 Presbyopia: Secondary | ICD-10-CM | POA: Diagnosis not present

## 2024-09-03 DIAGNOSIS — H25012 Cortical age-related cataract, left eye: Secondary | ICD-10-CM | POA: Diagnosis not present

## 2024-09-03 DIAGNOSIS — H35372 Puckering of macula, left eye: Secondary | ICD-10-CM | POA: Diagnosis not present

## 2024-09-03 DIAGNOSIS — H26491 Other secondary cataract, right eye: Secondary | ICD-10-CM | POA: Diagnosis not present

## 2024-09-03 DIAGNOSIS — H31002 Unspecified chorioretinal scars, left eye: Secondary | ICD-10-CM | POA: Diagnosis not present

## 2024-09-03 DIAGNOSIS — H2512 Age-related nuclear cataract, left eye: Secondary | ICD-10-CM | POA: Diagnosis not present

## 2024-09-14 DIAGNOSIS — H25812 Combined forms of age-related cataract, left eye: Secondary | ICD-10-CM | POA: Diagnosis not present

## 2024-09-14 DIAGNOSIS — H2512 Age-related nuclear cataract, left eye: Secondary | ICD-10-CM | POA: Diagnosis not present

## 2024-09-14 DIAGNOSIS — Z961 Presence of intraocular lens: Secondary | ICD-10-CM | POA: Diagnosis not present

## 2024-09-14 DIAGNOSIS — H25012 Cortical age-related cataract, left eye: Secondary | ICD-10-CM | POA: Diagnosis not present

## 2024-11-05 ENCOUNTER — Ambulatory Visit (INDEPENDENT_AMBULATORY_CARE_PROVIDER_SITE_OTHER): Admitting: Family Medicine

## 2024-11-05 VITALS — BP 130/80 | HR 66 | Temp 97.3°F | Ht 72.0 in | Wt 228.6 lb

## 2024-11-05 DIAGNOSIS — H269 Unspecified cataract: Secondary | ICD-10-CM | POA: Diagnosis not present

## 2024-11-05 DIAGNOSIS — N529 Male erectile dysfunction, unspecified: Secondary | ICD-10-CM | POA: Diagnosis not present

## 2024-11-05 DIAGNOSIS — M542 Cervicalgia: Secondary | ICD-10-CM

## 2024-11-05 MED ORDER — CYCLOBENZAPRINE HCL 10 MG PO TABS: 10.0000 mg | ORAL_TABLET | Freq: Three times a day (TID) | ORAL | 0 refills | Status: AC | PRN

## 2024-11-05 MED ORDER — MELOXICAM 15 MG PO TABS: 15.0000 mg | ORAL_TABLET | Freq: Every day | ORAL | 0 refills | Status: AC

## 2024-11-05 MED ORDER — TADALAFIL 20 MG PO TABS: 10.0000 mg | ORAL_TABLET | ORAL | 3 refills | Status: AC | PRN

## 2024-11-05 NOTE — Assessment & Plan Note (Signed)
 Not controlled with sildenafil .  Previously was referred to urology but has not seen them for a few years and is interested in going back.  Satteson issues with low testosterone  in the past as well.  We will try switching his Viagra  to Cialis  and place referral today.  We did discuss potential side effects of Cialis .  He can follow-up with us  in a few weeks to let us  know how he is doing.

## 2024-11-05 NOTE — Patient Instructions (Signed)
 It was very nice to see you today!  VISIT SUMMARY: During your visit, we discussed your visual disturbances following cataract surgery, persistent neck stiffness, and issues with sexual function. We have made plans to address each of these concerns.  YOUR PLAN: POST-CATARACT SURGERY VISUAL DISTURBANCE: You are experiencing persistent visual disturbances in your left eye after cataract surgery, especially at night. -An urgent referral to Dr. Octavia, who performed your previous right eye surgery, has been submitted for further evaluation and management.  CERVICALGIA WITH MUSCULAR STRAIN AND DEGENERATIVE CHANGES: You have severe neck stiffness and dizziness, likely due to muscular strain and possible degenerative changes. -You are prescribed meloxicam  for inflammation and Flexeril  as a muscle relaxant. -Perform range of motion exercises and use a heating pad for relief. -If there is no improvement in 1-2 weeks, we will consider further evaluation with an x-ray or referral to an orthopedic specialist.  ERECTILE DYSFUNCTION: You have an inadequate response to Viagra  and are interested in revisiting testosterone  therapy and trying Cialis  as an alternative. -You are referred to a urologist for further evaluation and management. -Cialis  is prescribed, starting with half a tablet to assess tolerance, with the potential to increase to a full dose if needed. -Be aware of potential side effects of Cialis , such as lightheadedness, dizziness, and headache due to possible blood pressure drop.  No follow-ups on file.   Take care, Dr Kennyth  PLEASE NOTE:  If you had any lab tests, please let us  know if you have not heard back within a few days. You may see your results on mychart before we have a chance to review them but we will give you a call once they are reviewed by us .   If we ordered any referrals today, please let us  know if you have not heard from their office within the next week.   If you had  any urgent prescriptions sent in today, please check with the pharmacy within an hour of our visit to make sure the prescription was transmitted appropriately.   Please try these tips to maintain a healthy lifestyle:  Eat at least 3 REAL meals and 1-2 snacks per day.  Aim for no more than 5 hours between eating.  If you eat breakfast, please do so within one hour of getting up.   Each meal should contain half fruits/vegetables, one quarter protein, and one quarter carbs (no bigger than a computer mouse)  Cut down on sweet beverages. This includes juice, soda, and sweet tea.   Drink at least 1 glass of water with each meal and aim for at least 8 glasses per day  Exercise at least 150 minutes every week.

## 2024-11-05 NOTE — Assessment & Plan Note (Signed)
 Patient had recent cataract extraction left eye that was had significant flaring afterwards.  He would like to be referred for second opinion to Dr. Eyvonne.  Will place referral today.

## 2024-11-05 NOTE — Progress Notes (Signed)
 "  Jason Weaver is a 69 y.o. male who presents today for an office visit.  Assessment/Plan:  New/Acute Problems: Neck Pain Exam consistent with muscular strain.  Likely has some underlying degenerative changes as well.  We discussed conservative measures.  Will start meloxicam  and Flexeril .  Discussed home exercises and handout was given.  We did discuss imaging however we will hold off on this for now.  We also did discuss referral to PT or sports medicine however hold off on this for now as well.  He will follow-up with us  in a couple weeks if not improving and would consider imaging versus referral at that time.  Chronic Problems Addressed Today: Cataract Patient had recent cataract extraction left eye that was had significant flaring afterwards.  He would like to be referred for second opinion to Dr. Eyvonne.  Will place referral today.  Erectile dysfunction Not controlled with sildenafil .  Previously was referred to urology but has not seen them for a few years and is interested in going back.  Satteson issues with low testosterone  in the past as well.  We will try switching his Viagra  to Cialis  and place referral today.  We did discuss potential side effects of Cialis .  He can follow-up with us  in a few weeks to let us  know how he is doing.     Subjective:  HPI:  See assessment / plan for status of chronic conditions.    Discussed the use of AI scribe software for clinical note transcription with the patient, who gave verbal consent to proceed.  History of Present Illness Jason Weaver is a 69 year old male who presents with visual disturbances following cataract surgery and persistent neck stiffness.  He underwent cataract surgery on his left eye in late November and has since experienced significant flaring and refraction issues when light hits his eye from an angle, particularly at night while driving. He describes this as 'like a flashing light.' Despite multiple follow-up  visits with the surgeon, the issue persists, and he has been referred to another specialist within the practice. He wishes to consult with Dr. Octavia, who performed his previous right eye surgery.  He has been experiencing a severely stiff neck for over a month, which is unusual for him as such stiffness typically resolves in a few days. The stiffness is accompanied by dizziness. He has tried heat, cold, and an teacher, music for relief, with heat providing some comfort. He has also used Tylenol , but is unsure of its effectiveness. No numbness or tingling in his hands or feet.  He mentions issues with sexual function, specifically that Viagra  does not provide sufficient efficacy for intercourse. He recalls being referred to a specialist for testosterone  evaluation but did not pursue it further. He is interested in revisiting this issue.  He is going to the gym more and walking more, which makes him feel tired.       Objective:  Physical Exam: BP 130/80   Pulse 66   Temp (!) 97.3 F (36.3 C) (Temporal)   Ht 6' (1.829 m)   Wt 228 lb 9.6 oz (103.7 kg)   SpO2 98%   BMI 31.00 kg/m   Gen: No acute distress, resting comfortably CV: Regular rate and rhythm with no murmurs appreciated Pulm: Normal work of breathing, clear to auscultation bilaterally with no crackles, wheezes, or rhonchi MUSCULOSKELETAL: - Neck: No deformities.  Tender to palpation along left SCM.  Pain elicited with leftward rotation and leftward flexion.  Neurovascular intact distally. Neuro: Grossly normal, moves all extremities Psych: Normal affect and thought content      Jason Weaver M. Kennyth, MD 11/05/2024 8:15 AM  "

## 2024-11-06 ENCOUNTER — Telehealth: Payer: Self-pay | Admitting: Family Medicine

## 2024-11-06 NOTE — Telephone Encounter (Signed)
 Copied from CRM [id number]. Topic: Referral - Status >> Nov 06, 2024 11:29 AM Rea ORN wrote: Reason for CRM: Pt called to advise that he just spoke with Octavia Hull to schedule his appt and they said they did not received the referral yet. Pt is asking for the referral to be faxed again.

## 2024-11-06 NOTE — Telephone Encounter (Signed)
 Referral refaxed to [phone]

## 2024-12-29 ENCOUNTER — Encounter: Admitting: Family Medicine

## 2025-05-17 ENCOUNTER — Ambulatory Visit
